# Patient Record
Sex: Female | Born: 1950
Health system: Southern US, Community
[De-identification: ages and names within clinical notes are randomized; demographics above are authoritative.]

## PROBLEM LIST (undated history)

## (undated) DIAGNOSIS — E119 Type 2 diabetes mellitus without complications: Secondary | ICD-10-CM

## (undated) DIAGNOSIS — M1990 Unspecified osteoarthritis, unspecified site: Secondary | ICD-10-CM

## (undated) DIAGNOSIS — Z9889 Other specified postprocedural states: Secondary | ICD-10-CM

## (undated) DIAGNOSIS — T8859XA Other complications of anesthesia, initial encounter: Secondary | ICD-10-CM

## (undated) DIAGNOSIS — E785 Hyperlipidemia, unspecified: Secondary | ICD-10-CM

## (undated) DIAGNOSIS — R112 Nausea with vomiting, unspecified: Secondary | ICD-10-CM

## (undated) DIAGNOSIS — T4145XA Adverse effect of unspecified anesthetic, initial encounter: Secondary | ICD-10-CM

## (undated) DIAGNOSIS — I10 Essential (primary) hypertension: Secondary | ICD-10-CM

## (undated) HISTORY — PX: BREAST SURGERY: SHX581

## (undated) HISTORY — DX: Hyperlipidemia, unspecified: E78.5

## (undated) HISTORY — PX: REFRACTIVE SURGERY: SHX103

## (undated) HISTORY — PX: OTHER SURGICAL HISTORY: SHX169

## (undated) HISTORY — DX: Essential (primary) hypertension: I10

## (undated) HISTORY — PX: TUBAL LIGATION: SHX77

## (undated) HISTORY — PX: DILATION AND CURETTAGE OF UTERUS: SHX78

## (undated) HISTORY — DX: Type 2 diabetes mellitus without complications: E11.9

## (undated) HISTORY — PX: FOOT SURGERY: SHX648

---

## 1996-08-21 HISTORY — PX: BREAST BIOPSY: SHX20

## 2001-04-29 ENCOUNTER — Encounter (HOSPITAL_COMMUNITY): Admission: RE | Admit: 2001-04-29 | Discharge: 2001-05-29 | Payer: Self-pay | Admitting: Orthopedic Surgery

## 2001-05-30 ENCOUNTER — Encounter: Payer: Self-pay | Admitting: Obstetrics and Gynecology

## 2001-05-30 ENCOUNTER — Ambulatory Visit (HOSPITAL_COMMUNITY): Admission: RE | Admit: 2001-05-30 | Discharge: 2001-05-30 | Payer: Self-pay | Admitting: Obstetrics and Gynecology

## 2001-10-18 ENCOUNTER — Encounter: Payer: Self-pay | Admitting: Family Medicine

## 2001-10-18 ENCOUNTER — Ambulatory Visit (HOSPITAL_COMMUNITY): Admission: RE | Admit: 2001-10-18 | Discharge: 2001-10-18 | Payer: Self-pay | Admitting: Family Medicine

## 2002-05-30 ENCOUNTER — Ambulatory Visit (HOSPITAL_COMMUNITY): Admission: RE | Admit: 2002-05-30 | Discharge: 2002-05-30 | Payer: Self-pay | Admitting: Obstetrics and Gynecology

## 2002-05-30 ENCOUNTER — Encounter: Payer: Self-pay | Admitting: Obstetrics and Gynecology

## 2003-06-01 ENCOUNTER — Ambulatory Visit (HOSPITAL_COMMUNITY): Admission: RE | Admit: 2003-06-01 | Discharge: 2003-06-01 | Payer: Self-pay | Admitting: Obstetrics and Gynecology

## 2003-06-01 ENCOUNTER — Encounter: Payer: Self-pay | Admitting: Obstetrics and Gynecology

## 2004-06-02 ENCOUNTER — Ambulatory Visit (HOSPITAL_COMMUNITY): Admission: RE | Admit: 2004-06-02 | Discharge: 2004-06-02 | Payer: Self-pay | Admitting: Obstetrics and Gynecology

## 2005-06-05 ENCOUNTER — Ambulatory Visit (HOSPITAL_COMMUNITY): Admission: RE | Admit: 2005-06-05 | Discharge: 2005-06-05 | Payer: Self-pay | Admitting: Obstetrics and Gynecology

## 2006-06-05 ENCOUNTER — Ambulatory Visit (HOSPITAL_COMMUNITY): Admission: RE | Admit: 2006-06-05 | Discharge: 2006-06-05 | Payer: Self-pay | Admitting: Obstetrics and Gynecology

## 2007-06-17 ENCOUNTER — Ambulatory Visit (HOSPITAL_COMMUNITY): Admission: RE | Admit: 2007-06-17 | Discharge: 2007-06-17 | Payer: Self-pay | Admitting: Obstetrics and Gynecology

## 2007-11-11 ENCOUNTER — Ambulatory Visit (HOSPITAL_COMMUNITY): Admission: RE | Admit: 2007-11-11 | Discharge: 2007-11-11 | Payer: Self-pay | Admitting: Family Medicine

## 2007-11-11 ENCOUNTER — Encounter: Payer: Self-pay | Admitting: Orthopedic Surgery

## 2007-12-03 ENCOUNTER — Ambulatory Visit: Payer: Self-pay | Admitting: Orthopedic Surgery

## 2007-12-03 DIAGNOSIS — M25819 Other specified joint disorders, unspecified shoulder: Secondary | ICD-10-CM | POA: Insufficient documentation

## 2007-12-03 DIAGNOSIS — M25519 Pain in unspecified shoulder: Secondary | ICD-10-CM | POA: Insufficient documentation

## 2007-12-03 DIAGNOSIS — M24519 Contracture, unspecified shoulder: Secondary | ICD-10-CM

## 2007-12-03 DIAGNOSIS — M7580 Other shoulder lesions, unspecified shoulder: Secondary | ICD-10-CM

## 2007-12-09 ENCOUNTER — Encounter (HOSPITAL_COMMUNITY): Admission: RE | Admit: 2007-12-09 | Discharge: 2008-01-08 | Payer: Self-pay | Admitting: Orthopedic Surgery

## 2007-12-09 ENCOUNTER — Encounter: Payer: Self-pay | Admitting: Orthopedic Surgery

## 2007-12-16 ENCOUNTER — Encounter: Payer: Self-pay | Admitting: Orthopedic Surgery

## 2008-01-10 ENCOUNTER — Encounter (HOSPITAL_COMMUNITY): Admission: RE | Admit: 2008-01-10 | Discharge: 2008-02-09 | Payer: Self-pay | Admitting: Orthopedic Surgery

## 2008-02-03 ENCOUNTER — Encounter: Payer: Self-pay | Admitting: Orthopedic Surgery

## 2008-02-10 ENCOUNTER — Encounter (HOSPITAL_COMMUNITY): Admission: RE | Admit: 2008-02-10 | Discharge: 2008-03-11 | Payer: Self-pay | Admitting: Orthopedic Surgery

## 2008-02-26 ENCOUNTER — Encounter: Payer: Self-pay | Admitting: Orthopedic Surgery

## 2008-02-28 ENCOUNTER — Encounter: Payer: Self-pay | Admitting: Orthopedic Surgery

## 2008-03-02 ENCOUNTER — Ambulatory Visit: Payer: Self-pay | Admitting: Orthopedic Surgery

## 2008-06-04 ENCOUNTER — Ambulatory Visit: Payer: Self-pay | Admitting: Orthopedic Surgery

## 2008-06-24 ENCOUNTER — Ambulatory Visit (HOSPITAL_COMMUNITY): Admission: RE | Admit: 2008-06-24 | Discharge: 2008-06-24 | Payer: Self-pay | Admitting: Obstetrics and Gynecology

## 2009-07-06 ENCOUNTER — Ambulatory Visit (HOSPITAL_COMMUNITY): Admission: RE | Admit: 2009-07-06 | Discharge: 2009-07-06 | Payer: Self-pay | Admitting: Obstetrics and Gynecology

## 2010-07-12 ENCOUNTER — Ambulatory Visit (HOSPITAL_COMMUNITY): Admission: RE | Admit: 2010-07-12 | Discharge: 2010-07-12 | Payer: Self-pay | Admitting: Obstetrics and Gynecology

## 2012-11-25 ENCOUNTER — Other Ambulatory Visit: Payer: Self-pay | Admitting: Certified Nurse Midwife

## 2012-11-25 NOTE — Telephone Encounter (Signed)
Pt. Has aex scheduled f

## 2012-11-25 NOTE — Telephone Encounter (Signed)
Pt has AEX sched with 12/20/12 with Ms. Eunice Blase

## 2012-12-18 ENCOUNTER — Other Ambulatory Visit: Payer: Self-pay | Admitting: Certified Nurse Midwife

## 2012-12-18 DIAGNOSIS — Z139 Encounter for screening, unspecified: Secondary | ICD-10-CM

## 2012-12-19 ENCOUNTER — Encounter: Payer: Self-pay | Admitting: Certified Nurse Midwife

## 2012-12-20 ENCOUNTER — Encounter: Payer: Self-pay | Admitting: Certified Nurse Midwife

## 2012-12-20 ENCOUNTER — Ambulatory Visit (INDEPENDENT_AMBULATORY_CARE_PROVIDER_SITE_OTHER): Payer: 59 | Admitting: Certified Nurse Midwife

## 2012-12-20 VITALS — BP 110/60 | HR 68 | Resp 16 | Ht 63.5 in | Wt 171.0 lb

## 2012-12-20 DIAGNOSIS — Z01419 Encounter for gynecological examination (general) (routine) without abnormal findings: Secondary | ICD-10-CM

## 2012-12-20 NOTE — Patient Instructions (Addendum)

## 2012-12-20 NOTE — Progress Notes (Signed)
62 y.o. [id number] Married Caucasian Fe here for annual exam. Menopausal on HRT, denies any vaginal bleeding.  Using Olive Oil for vaginal dryness.  Continues working on glucose control with PCP on Metformin. Patient now reports she is on Pravastatin for cholesterol.  Weight loss now 37 pounds.  Hypertensive medications has been increased to control. Reports cataract surgery was a success, now has drivers license again.  No health issues today   Patient's last menstrual period was 08/21/1988.          Sexually active: no  The current method of family planning is tubal ligation.    Exercising: yes  housework & yardwork Smoker:  no  Health Maintenance: Pap:  11-13-11 neg HPV HRneg MMG:  07/12/10 scheduled for 12/23/12 Colonoscopy:  none BMD:   none TDaP:  2008  Labs: none Self breast exam: occ   reports that she has never smoked. She has never used smokeless tobacco. She reports that she does not drink alcohol or use illicit drugs.  Past Medical History  Diagnosis Date  . Hypertension   . Hyperlipidemia   . Anemia   . Diabetes mellitus without complication     Past Surgical History  Procedure Laterality Date  . Extra digit removed      thumb  . Foot surgery Right   . Arthroscopic surgery Left   . Breast surgery Left     1998  . Dilation and curettage of uterus    . Refractive surgery      both eyes  . Cataract surgery    . Tubal ligation      Current Outpatient Prescriptions  Medication Sig Dispense Refill  . Canagliflozin (INVOKANA) 100 MG TABS Take 100 mg by mouth daily.      . metFORMIN (GLUCOPHAGE) 500 MG tablet Take 500 mg by mouth 2 (two) times daily with a meal.      . calcium-vitamin D (OSCAL 500/200 D-3) 500-200 MG-UNIT per tablet Take 1 tablet by mouth daily.      . clotrimazole-betamethasone (LOTRISONE) cream Apply topically 2 (two) times daily.      Marland Kitchen estradiol-norethindrone (ACTIVELLA) 1-0.5 MG per tablet TAKE ONE TABLET DAILY.  28 tablet  0  . losartan (COZAAR)  50 MG tablet Take 50 mg by mouth daily.      . meloxicam (MOBIC) 7.5 MG tablet Take 7.5 mg by mouth daily.      . Methylcellulose, Laxative, (CITRUCEL PO) Take by mouth daily.      . Potassium Chloride (K+ POTASSIUM PO) Take by mouth daily.      Marland Kitchen PRAVASTATIN SODIUM PO Take 10 mg by mouth daily.      Marland Kitchen terconazole (TERAZOL 7) 0.4 % vaginal cream Place 1 applicator vaginally at bedtime.       No current facility-administered medications for this visit.    Family History  Problem Relation Age of Onset  . Cancer Mother     liver  . Hypertension Mother   . Heart disease Mother     CVD  . Cancer Father     lymphoma    ROS:  Pertinent items are noted in HPI.  Otherwise, a comprehensive ROS was negative.  Exam:   BP 110/60  Pulse 68  Resp 16  Ht 5' 3.5" (1.613 m)  Wt 171 lb (77.565 kg)  BMI 29.81 kg/m2  LMP 08/21/1988 Height: 5' 3.5" (161.3 cm)  Ht Readings from Last 3 Encounters:  12/20/12 5' 3.5" (1.613 m)    General  appearance: alert, cooperative and appears stated age Head: Normocephalic, without obvious abnormality, atraumatic Neck: no adenopathy, supple, symmetrical, trachea midline and thyroid normal to inspection and palpation Lungs: clear to auscultation bilaterally Breasts: normal appearance, no masses or tenderness, No nipple discharge or bleeding Heart: regular rate and rhythm Abdomen: soft, non-tender; no masses,  no organomegaly Extremities: extremities normal, atraumatic, no cyanosis or edema Skin: Skin color, texture, turgor normal. No rashes or lesions Lymph nodes: Cervical, supraclavicular, and axillary nodes normal. No abnormal inguinal nodes palpated Neurologic: Grossly normal   Pelvic: External genitalia:  no lesions              Urethra:  normal appearing urethra with no masses, tenderness or lesions              Bartholin's and Skene's: normal                 Vagina: normal appearing vagina with normal color and discharge, no lesions               Cervix: normal              Pap taken: no Bimanual Exam:  Uterus:  normal size, contour, position, consistency, mobility, non-tender and anteverted              Adnexa: normal adnexa and no mass, fullness, tenderness               Rectovaginal: Confirms               Anus:  normal sphincter tone, no lesions  A:  Well Woman with normal exam  Menopausal on HRT  Hypertension with medication increase  Diabetes still not stable on medication  Medication for cholesterol now  Weight loss in progress  P: Reviewed health and wellness pertinent to exam Discussed risks and benefits of colonoscopy, declies scheduling.  Does IFOB with PCP.  Discussed concerns with continued use of HRT   With current health problems and feel may need to wean off.  Patient agreeable , will start by taking every other day, has 2 weeks left on prescription. Will advise if issues with. Continue follow up as indicated with current health problems with MD.  Pap smear due again@ 66 unless otherwise indicated.  Mammogram yearly   return annually or prn  An After Visit Summary was printed and given to the patient.  Reviewed, TL

## 2012-12-23 ENCOUNTER — Ambulatory Visit (HOSPITAL_COMMUNITY)
Admission: RE | Admit: 2012-12-23 | Discharge: 2012-12-23 | Disposition: A | Discharge status: Home / Self Care | Payer: 59 | Source: Ambulatory Visit | Attending: Certified Nurse Midwife | Admitting: Certified Nurse Midwife

## 2012-12-23 DIAGNOSIS — Z139 Encounter for screening, unspecified: Secondary | ICD-10-CM

## 2012-12-23 DIAGNOSIS — Z1231 Encounter for screening mammogram for malignant neoplasm of breast: Secondary | ICD-10-CM | POA: Insufficient documentation

## 2013-01-29 ENCOUNTER — Telehealth: Payer: Self-pay | Admitting: Certified Nurse Midwife

## 2013-01-29 NOTE — Telephone Encounter (Signed)
Pt says she is gradually going off her hormone medication and is having night sweats really bad. Please call to advise.

## 2013-01-29 NOTE — Telephone Encounter (Signed)
Patient called to report has weaned herself on current hormone medication to 5 - 6 days apart and has 3 more left. Daytime and night time sweating and hot flashes have gotten worse and she is not sleeping due to this. . States her PCP did stop her Pravastatin and cholesterol medication 2 weeks ago due to side effects in her legs and states she feels better being off of them now but would like advise on what to do about a lower dose of hormones. Please advise. sue

## 2013-01-31 NOTE — Telephone Encounter (Signed)
Spoke with patient about what she is experiencing, and Debbie's advise to stop HRT now because the length in Between and then surge with the dose she is taking increasing symptoms. Pt was ok with doing this I advised her To give it at lease 3 weeks,  If symptoms are  not any better to give [place] a call. Pt will f/u  in 3 weeks.

## 2013-01-31 NOTE — Telephone Encounter (Signed)
Patient is experiencing the withdrawal of hormone symptoms which should be come less. Advise to stop HRT now because the length in between and then surge with the dose she is taking is increasing symptoms.

## 2013-02-27 ENCOUNTER — Telehealth: Payer: Self-pay | Admitting: Certified Nurse Midwife

## 2013-02-27 NOTE — Telephone Encounter (Signed)
Patient calling complaining of hot flashes. Please advise?

## 2013-02-27 NOTE — Telephone Encounter (Signed)
Spoke with pt who took her last estradiol pill on June 10th. Pt most recently saw DL on May 2. Pt has been continuing to have 4-5 hot flashes a day, usually accompanied by sweating. Pt thought she should be done with all of this by now. Please advise.

## 2013-02-27 NOTE — Telephone Encounter (Signed)
Spoke with pt about DL advice. Pt agreeable. Pt will call back in a few months if she is still having symptoms.

## 2013-02-27 NOTE — Telephone Encounter (Signed)
Usually takes 2-3 months after stopping estrogen before have decreased significantly and still may hot flashes or night sweats have occasionally.

## 2013-04-22 ENCOUNTER — Telehealth: Payer: Self-pay | Admitting: Certified Nurse Midwife

## 2013-04-22 NOTE — Telephone Encounter (Signed)
Patient has been off hormones for twelve weeks and hot flashes are no better. Please advise?

## 2013-04-23 NOTE — Telephone Encounter (Signed)
I reviewed her chart and feel she can go back on hormones if she is this uncomfortable. If she need rx can refill what is on file. Have patient schedule OV  After resuming hormones if working well, to discuss

## 2013-04-24 ENCOUNTER — Other Ambulatory Visit: Payer: Self-pay | Admitting: Orthopedic Surgery

## 2013-04-24 MED ORDER — ESTRADIOL-NORETHINDRONE ACET 1-0.5 MG PO TABS: ORAL_TABLET | ORAL | Status: DC

## 2013-04-24 NOTE — Telephone Encounter (Signed)
Spoke with pt about DL advice to resume hormones. Pt was taking Activella daily, and refills are needed. Advised pt I would refill for 3 months, and then pt can come in and talk with DL about how things are going. Pt agreeable. Is this OK?

## 2013-04-25 NOTE — Telephone Encounter (Signed)
OK to recheck after she restarts HRT.

## 2013-06-30 ENCOUNTER — Telehealth: Payer: Self-pay | Admitting: Certified Nurse Midwife

## 2013-06-30 ENCOUNTER — Other Ambulatory Visit: Payer: Self-pay | Admitting: Certified Nurse Midwife

## 2013-06-30 MED ORDER — ESTRADIOL-NORETHINDRONE ACET 1-0.5 MG PO TABS: ORAL_TABLET | ORAL | Status: DC

## 2013-06-30 NOTE — Telephone Encounter (Signed)
Estradial--patient calling to speak with the nurse about her status after taking it for almost three months. Patient states she is, "Doing wonderful on the medication!" Patient also will need refills.  Robbie Lis [phone]

## 2013-06-30 NOTE — Telephone Encounter (Signed)
OK to continue on HRT per discussion from phone note after restart ( see phone note encounters) will put refill in

## 2013-06-30 NOTE — Telephone Encounter (Signed)
Tracy Bonilla, please advise, do you agree to order refills? Last note states she was attempting to wean off HRT

## 2013-06-30 NOTE — Telephone Encounter (Signed)
Notified patient. Will pick up medication.

## 2013-12-23 ENCOUNTER — Other Ambulatory Visit: Payer: Self-pay | Admitting: Certified Nurse Midwife

## 2013-12-23 DIAGNOSIS — Z1231 Encounter for screening mammogram for malignant neoplasm of breast: Secondary | ICD-10-CM

## 2013-12-25 ENCOUNTER — Ambulatory Visit (HOSPITAL_COMMUNITY)
Admission: RE | Admit: 2013-12-25 | Discharge: 2013-12-25 | Disposition: A | Discharge status: Home / Self Care | Payer: 59 | Source: Ambulatory Visit | Attending: Certified Nurse Midwife | Admitting: Certified Nurse Midwife

## 2013-12-25 ENCOUNTER — Ambulatory Visit (HOSPITAL_COMMUNITY): Payer: 59

## 2013-12-25 DIAGNOSIS — Z1231 Encounter for screening mammogram for malignant neoplasm of breast: Secondary | ICD-10-CM | POA: Insufficient documentation

## 2014-03-03 ENCOUNTER — Encounter: Payer: Self-pay | Admitting: Certified Nurse Midwife

## 2014-03-03 ENCOUNTER — Ambulatory Visit (INDEPENDENT_AMBULATORY_CARE_PROVIDER_SITE_OTHER): Payer: 59 | Admitting: Certified Nurse Midwife

## 2014-03-03 VITALS — BP 136/76 | HR 64 | Resp 18 | Ht 63.25 in | Wt 180.0 lb

## 2014-03-03 DIAGNOSIS — Z124 Encounter for screening for malignant neoplasm of cervix: Secondary | ICD-10-CM

## 2014-03-03 DIAGNOSIS — I10 Essential (primary) hypertension: Secondary | ICD-10-CM | POA: Insufficient documentation

## 2014-03-03 DIAGNOSIS — Z0000 Encounter for general adult medical examination without abnormal findings: Secondary | ICD-10-CM

## 2014-03-03 DIAGNOSIS — N951 Menopausal and female climacteric states: Secondary | ICD-10-CM

## 2014-03-03 DIAGNOSIS — Z01419 Encounter for gynecological examination (general) (routine) without abnormal findings: Secondary | ICD-10-CM

## 2014-03-03 DIAGNOSIS — E119 Type 2 diabetes mellitus without complications: Secondary | ICD-10-CM | POA: Insufficient documentation

## 2014-03-03 MED ORDER — ESTRADIOL-NORETHINDRONE ACET 0.5-0.1 MG PO TABS: 1.0000 | ORAL_TABLET | Freq: Every day | ORAL | Status: DC

## 2014-03-03 NOTE — Progress Notes (Signed)
63 y.o. [id number] Married Caucasian Fe here for annual exam. Menopausal  On HRT. Patient tried to go off HRT and became" too symptomatic" with hot flashes, night sweats and anxiety feelings. Patient no longer on Norvasc and hypertension well controlled and so is diabetes with Metformin. Recent visit with PCP with aex and all labs per patient normal. Denies vaginal bleeding or vaginal dryness. Desires HRT continuance but will decrease dose, but feel so much better on. No other health issues today.  Patient's last menstrual period was 08/21/1988.          Sexually active: Yes.    The current method of family planning is post menopausal status.    Exercising: Yes.    Walking daily Smoker:  no  Health Maintenance: Pap: 10/2011 Neg. HR HPV: Neg MMG: 12/2013 BIRADS1: Neg Colonoscopy: N/A BMD:   N/A TDaP: 2008 Labs: PCP all negative per patient    reports that she has never smoked. She has never used smokeless tobacco. She reports that she does not drink alcohol or use illicit drugs.  Past Medical History  Diagnosis Date  . Hypertension   . Hyperlipidemia   . Anemia   . Diabetes mellitus without complication     Past Surgical History  Procedure Laterality Date  . Extra digit removed      thumb  . Foot surgery Right   . Arthroscopic surgery Left   . Breast surgery Left     1998  . Dilation and curettage of uterus    . Refractive surgery      both eyes  . Cataract surgery    . Tubal ligation      Current Outpatient Prescriptions  Medication Sig Dispense Refill  . aspirin 81 MG tablet Take 81 mg by mouth daily.      . calcium-vitamin D (OSCAL 500/200 D-3) 500-200 MG-UNIT per tablet Take 1 tablet by mouth daily.      . Canagliflozin (INVOKANA) 100 MG TABS Take 100 mg by mouth daily.      Marland Kitchen estradiol-norethindrone (ACTIVELLA) 1-0.5 MG per tablet TAKE ONE TABLET DAILY.  28 tablet  7  . hydrochlorothiazide (HYDRODIURIL) 25 MG tablet Take 25 mg by mouth daily.       Marland Kitchen losartan (COZAAR)  50 MG tablet Take 50 mg by mouth daily.      . metFORMIN (GLUCOPHAGE) 500 MG tablet Take 500 mg by mouth 2 (two) times daily with a meal.      . Methylcellulose, Laxative, (CITRUCEL PO) Take by mouth daily.      . potassium chloride (K-DUR,KLOR-CON) 10 MEQ tablet       . clotrimazole-betamethasone (LOTRISONE) cream Apply topically 2 (two) times daily.       No current facility-administered medications for this visit.    Family History  Problem Relation Age of Onset  . Cancer Mother     liver  . Hypertension Mother   . Heart disease Mother     CVD  . Cancer Father     lymphoma    ROS:  Pertinent items are noted in HPI.  Otherwise, a comprehensive ROS was negative.  Exam:   BP 136/76  Pulse 64  Resp 18  Ht 5' 3.25" (1.607 m)  Wt 180 lb (81.647 kg)  BMI 31.62 kg/m2  LMP 08/21/1988 Height: 5' 3.25" (160.7 cm)  Ht Readings from Last 3 Encounters:  03/03/14 5' 3.25" (1.607 m)  12/20/12 5' 3.5" (1.613 m)    General appearance: alert, cooperative and  appears stated age Head: Normocephalic, without obvious abnormality, atraumatic Neck: no adenopathy, supple, symmetrical, trachea midline and thyroid normal to inspection and palpation and non-palpable Lungs: clear to auscultation bilaterally Breasts: normal appearance, no masses or tenderness, No nipple retraction or dimpling, No nipple discharge or bleeding, No axillary or supraclavicular adenopathy Heart: regular rate and rhythm Abdomen: soft, non-tender; no masses,  no organomegaly Extremities: extremities normal, atraumatic, no cyanosis or edema Skin: Skin color, texture, turgor normal. No rashes or lesions Lymph nodes: Cervical, supraclavicular, and axillary nodes normal. No abnormal inguinal nodes palpated Neurologic: Grossly normal   Pelvic: External genitalia:  no lesions              Urethra:  normal appearing urethra with no masses, tenderness or lesions              Bartholin's and Skene's: normal                  Vagina: normal appearing vagina with normal color and discharge, no lesions              Cervix: normal,non tender              Pap taken: Yes.   Bimanual Exam:  Uterus:  normal size, contour, position, consistency, mobility, non-tender and anteverted              Adnexa: normal adnexa and no mass, fullness, tenderness               Rectovaginal: Confirms               Anus:  normal sphincter tone, no lesions  A:  Well Woman with normal exam  Menopausal on HRT unable to wean off due to symptoms  Hypertension/Type 2 Diabetes well controlled with medication with PCP management.  Colonoscopy due declines scheduling, plans to schedule with PCP in 12/15 PCP dispensed IFOB negative per patient  P:   Reviewed health and wellness pertinent to exam  Discussed risks and benefits and concerns with long term use of estrogen and progesterone, cardiovascular concerns. Patient acknowledged this information, but tried to stop and felt " horrible". Agreeable to decrease dose with re-evaluation with health change.  Rx  Pap smear taken today  counseled on breast self exam, mammography screening, use and side effects of HRT, adequate intake of calcium and vitamin D, diet and exercise return annually or prn  An After Visit Summary was printed and given to the patient.

## 2014-03-03 NOTE — Progress Notes (Signed)
Reviewed personally.  M. Suzanne Steadman Prosperi, MD.  

## 2014-03-03 NOTE — Patient Instructions (Addendum)
EXERCISE AND DIET:  We recommended that you start or continue a regular exercise program for good health. Regular exercise means any activity that makes your heart beat faster and makes you sweat.  We recommend exercising at least 30 minutes per day at least 3 days a week, preferably 4 or 5.  We also recommend a diet low in fat and sugar.  Inactivity, poor dietary choices and obesity can cause diabetes, heart attack, stroke, and kidney damage, among others.    ALCOHOL AND SMOKING:  Women should limit their alcohol intake to no more than 7 drinks/beers/glasses of wine (combined, not each!) per week. Moderation of alcohol intake to this level decreases your risk of breast cancer and liver damage. And of course, no recreational drugs are part of a healthy lifestyle.  And absolutely no smoking or even second hand smoke. Most people know smoking can cause heart and lung diseases, but did you know it also contributes to weakening of your bones? Aging of your skin?  Yellowing of your teeth and nails?  CALCIUM AND VITAMIN D:  Adequate intake of calcium and Vitamin D are recommended.  The recommendations for exact amounts of these supplements seem to change often, but generally speaking 600 mg of calcium (either carbonate or citrate) and 800 units of Vitamin D per day seems prudent. Certain women may benefit from higher intake of Vitamin D.  If you are among these women, your doctor will have told you during your visit.    PAP SMEARS:  Pap smears, to check for cervical cancer or precancers,  have traditionally been done yearly, although recent scientific advances have shown that most women can have pap smears less often.  However, every woman still should have a physical exam from her gynecologist every year. It will include a breast check, inspection of the vulva and vagina to check for abnormal growths or skin changes, a visual exam of the cervix, and then an exam to evaluate the size and shape of the uterus and  ovaries.  And after 62 years of age, a rectal exam is indicated to check for rectal cancers. We will also provide age appropriate advice regarding health maintenance, like when you should have certain vaccines, screening for sexually transmitted diseases, bone density testing, colonoscopy, mammograms, etc.   MAMMOGRAMS:  All women over 40 years old should have a yearly mammogram. Many facilities now offer a "3D" mammogram, which may cost around $50 extra out of pocket. If possible,  we recommend you accept the option to have the 3D mammogram performed.  It both reduces the number of women who will be called back for extra views which then turn out to be normal, and it is better than the routine mammogram at detecting truly abnormal areas.    COLONOSCOPY:  Colonoscopy to screen for colon cancer is recommended for all women at age 50.  We know, you hate the idea of the prep.  We agree, BUT, having colon cancer and not knowing it is worse!!  Colon cancer so often starts as a polyp that can be seen and removed at colonscopy, which can quite literally save your life!  And if your first colonoscopy is normal and you have no family history of colon cancer, most women don't have to have it again for 10 years.  Once every ten years, you can do something that may end up saving your life, right?  We will be happy to help you get it scheduled when you are ready.    Be sure to check your insurance coverage so you understand how much it will cost.  It may be covered as a preventative service at no cost, but you should check your particular policy.    Hormone Therapy At menopause, your body begins making less estrogen and progesterone hormones. This causes the body to stop having menstrual periods. This is because estrogen and progesterone hormones control your periods and menstrual cycle. A lack of estrogen may cause symptoms such as:  Hot flushes (or hot flashes).  Vaginal dryness.  Dry skin.  Loss of sex  drive.  Risk of bone loss (osteoporosis). When this happens, you may choose to take hormone therapy to get back the estrogen lost during menopause. When the hormone estrogen is given alone, it is usually referred to as ET (Estrogen Therapy). When the hormone progestin is combined with estrogen, it is generally called HT (Hormone Therapy). This was formerly known as hormone replacement therapy (HRT). Your caregiver can help you make a decision on what will be best for you. The decision to use HT seems to change often as new studies are done. Many studies do not agree on the benefits of hormone replacement therapy. LIKELY BENEFITS OF HT INCLUDE PROTECTION FROM:  Hot Flushes (also called hot flashes) - A hot flush is a sudden feeling of heat that spreads over the face and body. The skin may redden like a blush. It is connected with sweats and sleep disturbance. Women going through menopause may have hot flushes a few times a month or several times per day depending on the woman.  Osteoporosis (bone loss)- Estrogen helps guard against bone loss. After menopause, a woman's bones slowly lose calcium and become weak and brittle. As a result, bones are more likely to break. The hip, wrist, and spine are affected most often. Hormone therapy can help slow bone loss after menopause. Weight bearing exercise and taking calcium with vitamin D also can help prevent bone loss. There are also medications that your caregiver can prescribe that can help prevent osteoporosis.  Vaginal Dryness - Loss of estrogen causes changes in the vagina. Its lining may become thin and dry. These changes can cause pain and bleeding during sexual intercourse. Dryness can also lead to infections. This can cause burning and itching. (Vaginal estrogen treatment can help relieve pain, itching, and dryness.)  Urinary Tract Infections are more common after menopause because of lack of estrogen. Some women also develop urinary incontinence  because of low estrogen levels in the vagina and bladder.  Possible other benefits of estrogen include a positive effect on mood and short-term memory in women. RISKS AND COMPLICATIONS  Using estrogen alone without progesterone causes the lining of the uterus to grow. This increases the risk of lining of the uterus (endometrial) cancer. Your caregiver should give another hormone called progestin if you have a uterus.  Women who take combined (estrogen and progestin) HT appear to have an increased risk of breast cancer. The risk appears to be small, but increases throughout the time that HT is taken.  Combined therapy also makes the breast tissue slightly denser which makes it harder to read mammograms (breast X-rays).  Combined, estrogen and progesterone therapy can be taken together every day, in which case there may be spotting of blood. HT therapy can be taken cyclically in which case you will have menstrual periods. Cyclically means HT is taken for a set amount of days, then not taken, then this process is repeated.  HT may   increase the risk of stroke, heart attack, breast cancer and forming blood clots in your leg.  Transdermal estrogen (estrogen that is absorbed through the skin with a patch or a cream) may have more positive results with:  Cholesterol.  Blood pressure.  Blood clots. Having the following conditions may indicate you should not have HT:  Endometrial cancer.  Liver disease.  Breast cancer.  Heart disease.  History of blood clots.  Stroke. TREATMENT   If you choose to take HT and have a uterus, usually estrogen and progestin are prescribed.  Your caregiver will help you decide the best way to take the medications.  Possible ways to take estrogen include:  Pills.  Patches.  Gels.  Sprays.  Vaginal estrogen cream, rings and tablets.  It is best to take the lowest dose possible that will help your symptoms and take them for the shortest period of  time that you can.  Hormone therapy can help relieve some of the problems (symptoms) that affect women at menopause. Before making a decision about HT, talk to your caregiver about what is best for you. Be well informed and comfortable with your decisions. HOME CARE INSTRUCTIONS   Follow your caregivers advice when taking the medications.  A Pap test is done to screen for cervical cancer.  The first Pap test should be done at age 21.  Between ages 21 and 29, Pap tests are repeated every 2 years.  Beginning at age 30, you are advised to have a Pap test every 3 years as long as your past 3 Pap tests have been normal.  Some women have medical problems that increase the chance of getting cervical cancer. Talk to your caregiver about these problems. It is especially important to talk to your caregiver if a new problem develops soon after your last Pap test. In these cases, your caregiver may recommend more frequent screening and Pap tests.  The above recommendations are the same for women who have or have not gotten the vaccine for HPV (Human Papillomavirus).  If you had a hysterectomy for a problem that was not a cancer or a condition that could lead to cancer, then you no longer need Pap tests. However, even if you no longer need a Pap test, a regular exam is a good idea to make sure no other problems are starting.   If you are between ages 65 and 70, and you have had normal Pap tests going back 10 years, you no longer need Pap tests. However, even if you no longer need a Pap test, a regular exam is a good idea to make sure no other problems are starting.   If you have had past treatment for cervical cancer or a condition that could lead to cancer, you need Pap tests and screening for cancer for at least 20 years after your treatment.  If Pap tests have been discontinued, risk factors (such as a new sexual partner) need to be re-assessed to determine if screening should be  resumed.  Some women may need screenings more often if they are at high risk for cervical cancer.  Get mammograms done as per the advice of your caregiver. SEEK IMMEDIATE MEDICAL CARE IF:  You develop abnormal vaginal bleeding.  You have pain or swelling in your legs, shortness of breath, or chest pain.  You develop dizziness or headaches.  You have lumps or changes in your breasts or armpits.  You have slurred speech.  You develop weakness or numbness of   your arms or legs.  You have pain, burning, or bleeding when urinating.  You develop abdominal pain. Document Released: 05/06/2003 Document Revised: 10/30/2011 Document Reviewed: 08/24/2010 [employer] Patient Information 2015 [employer]. This information is not intended to replace advice given to you by your health care provider. Make sure you discuss any questions you have with your health care provider.  

## 2014-03-05 LAB — IPS PAP TEST WITH HPV

## 2014-03-30 ENCOUNTER — Telehealth: Payer: Self-pay | Admitting: Certified Nurse Midwife

## 2014-03-30 MED ORDER — ESTRADIOL-NORETHINDRONE ACET 1-0.5 MG PO TABS: ORAL_TABLET | ORAL | Status: DC

## 2014-03-30 NOTE — Telephone Encounter (Signed)
Spoke with patient. Advised of message as seen below from Verner Choleborah S. Leonard CNM. Patient is agreeable and verbalizes understanding. Rx for Estradiol 1-0.5mg  #30 [id number] placed to pharmacy on file. Patient will call back if symptoms persist with dosage increase.  Routing to provider for final review. Patient agreeable to disposition. Will close encounter

## 2014-03-30 NOTE — Telephone Encounter (Signed)
Patient can return to original dose to see if resolves. If not needs OV

## 2014-03-30 NOTE — Telephone Encounter (Signed)
Pt is calling to talk with the nurse she needs to give her an update on how she is doing.

## 2014-03-30 NOTE — Telephone Encounter (Signed)
Spoke with patient. Patient states that she was seen on 7/14 with Verner Choleborah S. Leonard CNM and her hormone medication dosage was decreased. Patient is currently taking 0.5/0.1 of Estradiol. Patient states that she is still having "hot sweats, flashes, and a hot face almost all the time. I am having to wash my face to cool down with a dish cloth that is how much I am sweating." Advised patient would send a message over to Verner Choleborah S. Leonard CNM and give her an update. Advised would call back with further recommendations and instructions. Patient agreeable.

## 2014-04-22 ENCOUNTER — Telehealth: Payer: Self-pay | Admitting: Certified Nurse Midwife

## 2014-04-22 NOTE — Telephone Encounter (Signed)
Spoke with patient. Patient states that she is still sweating and having hot flashes. "Water is dripping of my face. It feels like I have a mask on. It is better than before but is still uncomfortable. I have had 5-6 hot flashes today." Patient would like to know if there is anything else that can be done as she is having hot flashes so often. Advised patient would send a message over to Verner Chol CNM and give her a call back with any further recommendations. Patient is agreeable. Patient is currently on Activella.

## 2014-04-22 NOTE — Telephone Encounter (Signed)
She needs OV to evaluate and discuss

## 2014-04-22 NOTE — Telephone Encounter (Signed)
Pt wanted to let Eunice Blase know how she is doing on her hormone medication.

## 2014-04-23 ENCOUNTER — Ambulatory Visit (INDEPENDENT_AMBULATORY_CARE_PROVIDER_SITE_OTHER): Payer: 59 | Admitting: Certified Nurse Midwife

## 2014-04-23 ENCOUNTER — Encounter: Payer: Self-pay | Admitting: Certified Nurse Midwife

## 2014-04-23 VITALS — BP 120/80 | HR 72 | Resp 16 | Ht 63.25 in | Wt 179.0 lb

## 2014-04-23 DIAGNOSIS — N951 Menopausal and female climacteric states: Secondary | ICD-10-CM

## 2014-04-23 NOTE — Telephone Encounter (Signed)
Spoke with patient. Advised of message as seen below from Tracy Bonilla CNM. Patient agreeable. Patient will run out of rx on Sunday. Appointment scheduled for today at 11:15am. Agreeable to date and time.  Routing to provider for final review. Patient agreeable to disposition. Will close encounter

## 2014-04-23 NOTE — Progress Notes (Signed)
63 y.o.Married Caucasian female [phone] here for follow-up of HRT decrease in dosage.  Patient has been on Activella 1/0.5 and decrease to 1/2 tablet.  Initiated 03/09/14.  Patient taking medication as instructed PM. Patient noticed that hot flashes started returning and became uncomfortable again, but remained on same dose for one month with increasing  night sweats. Patient went back to previous tablet and with relief and is sleeping well. Patient continues with profuse sweating in am and has to change clothes. She is on no new medications, but does take several medications in am together. She feels it is the Invokana she is on. Patient came it to discuss the issue to make sure it was not hormonal.She also takes Metformin and feels her glucose level is normal. No other concerns.  O:Healthy WD,WN female, appropriately dressed     Affect : Appropriate    A: Menopausal on HRT working well Profuse sweating not related to menopausal symptoms  P:1- Continue HRT medication as prescribed Discussed with patient that she and I both feel after discussion that it is not HRT related. Discussed follow up with PCP as a new problem or medication side effect. Patient agrees and plans to schedule appointment to discuss with PCP.  Rv prn   20 minutes spent with patient  in face to face counseling.

## 2014-04-25 NOTE — Progress Notes (Signed)
Reviewed personally.  M. Suzanne Kairee Kozma, MD.  

## 2014-06-22 ENCOUNTER — Encounter: Payer: Self-pay | Admitting: Certified Nurse Midwife

## 2014-12-08 ENCOUNTER — Other Ambulatory Visit (HOSPITAL_COMMUNITY): Payer: Self-pay | Admitting: Physician Assistant

## 2014-12-08 ENCOUNTER — Ambulatory Visit (HOSPITAL_COMMUNITY)
Admission: RE | Admit: 2014-12-08 | Discharge: 2014-12-08 | Disposition: A | Discharge status: Home / Self Care | Payer: 59 | Source: Ambulatory Visit | Attending: Physician Assistant | Admitting: Physician Assistant

## 2014-12-08 DIAGNOSIS — M25562 Pain in left knee: Secondary | ICD-10-CM | POA: Diagnosis present

## 2014-12-08 DIAGNOSIS — M79672 Pain in left foot: Secondary | ICD-10-CM

## 2014-12-08 DIAGNOSIS — G8929 Other chronic pain: Secondary | ICD-10-CM | POA: Diagnosis not present

## 2015-01-25 ENCOUNTER — Other Ambulatory Visit: Payer: Self-pay | Admitting: Nurse Practitioner

## 2015-01-25 DIAGNOSIS — Z1231 Encounter for screening mammogram for malignant neoplasm of breast: Secondary | ICD-10-CM

## 2015-02-04 ENCOUNTER — Ambulatory Visit (HOSPITAL_COMMUNITY)
Admission: RE | Admit: 2015-02-04 | Discharge: 2015-02-04 | Disposition: A | Discharge status: Home / Self Care | Payer: 59 | Source: Ambulatory Visit | Attending: Nurse Practitioner | Admitting: Nurse Practitioner

## 2015-02-04 DIAGNOSIS — Z1231 Encounter for screening mammogram for malignant neoplasm of breast: Secondary | ICD-10-CM | POA: Insufficient documentation

## 2015-03-08 ENCOUNTER — Ambulatory Visit: Payer: Self-pay | Admitting: Certified Nurse Midwife

## 2015-03-09 ENCOUNTER — Ambulatory Visit: Payer: Self-pay | Admitting: Nurse Practitioner

## 2015-04-27 ENCOUNTER — Ambulatory Visit: Payer: Self-pay | Admitting: Orthopedic Surgery

## 2015-05-05 ENCOUNTER — Ambulatory Visit: Payer: Self-pay | Admitting: Orthopedic Surgery

## 2015-05-05 NOTE — Patient Instructions (Addendum)
YOUR PROCEDURE IS SCHEDULED ON :  05/14/15  REPORT TO [facility] MAIN ENTRANCE FOLLOW SIGNS TO EAST ELEVATOR - GO TO 3rd FLOOR CHECK IN AT 3 EAST NURSES STATION (SHORT STAY) AT:   7:30 AM  CALL THIS NUMBER IF YOU HAVE PROBLEMS THE MORNING OF SURGERY [phone]  REMEMBER:ONLY 1 PER PERSON MAY GO TO SHORT STAY WITH YOU TO GET READY THE MORNING OF YOUR SURGERY  DO NOT EAT FOOD OR DRINK LIQUIDS AFTER MIDNIGHT  TAKE THESE MEDICINES THE MORNING OF SURGERY: NONE  YOU MAY NOT HAVE ANY METAL ON YOUR BODY INCLUDING HAIR PINS AND PIERCING'S. DO NOT WEAR JEWELRY, MAKEUP, LOTIONS, POWDERS OR PERFUMES. DO NOT WEAR NAIL POLISH. DO NOT SHAVE 48 HRS PRIOR TO SURGERY. MEN MAY SHAVE FACE AND NECK.  DO NOT BRING VALUABLES TO HOSPITAL. Tracy Bonilla IS NOT RESPONSIBLE FOR VALUABLES.  CONTACTS, DENTURES OR PARTIALS MAY NOT BE WORN TO SURGERY. LEAVE SUITCASE IN CAR. CAN BE BROUGHT TO ROOM AFTER SURGERY.  PATIENTS DISCHARGED THE DAY OF SURGERY WILL NOT BE ALLOWED TO DRIVE HOME.  PLEASE READ OVER THE FOLLOWING INSTRUCTION SHEETS _________________________________________________________________________________                                          Tracy Bonilla - PREPARING FOR SURGERY  Before surgery, you can play an important role.  Because skin is not sterile, your skin needs to be as free of germs as possible.  You can reduce the number of germs on your skin by washing with CHG (chlorahexidine gluconate) soap before surgery.  CHG is an antiseptic cleaner which kills germs and bonds with the skin to continue killing germs even after washing. Please DO NOT use if you have an allergy to CHG or antibacterial soaps.  If your skin becomes reddened/irritated stop using the CHG and inform your nurse when you arrive at Short Stay. Do not shave (including legs and underarms) for at least 48 hours prior to the first CHG shower.  You may shave your face. Please follow these instructions  carefully:   1.  Shower with CHG Soap the night before surgery and the  morning of Surgery.   2.  If you choose to wash your hair, wash your hair first as usual with your  normal  Shampoo.   3.  After you shampoo, rinse your hair and body thoroughly to remove the  shampoo.                                         4.  Use CHG as you would any other liquid soap.  You can apply chg directly  to the skin and wash . Gently wash with scrungie or clean wascloth    5.  Apply the CHG Soap to your body ONLY FROM THE NECK DOWN.   Do not use on open                           Wound or open sores. Avoid contact with eyes, ears mouth and genitals (private parts).                        Genitals (private parts) with your normal soap.  6.  Wash thoroughly, paying special attention to the area where your surgery  will be performed.   7.  Thoroughly rinse your body with warm water from the neck down.   8.  DO NOT shower/wash with your normal soap after using and rinsing off  the CHG Soap .                9.  Pat yourself dry with a clean towel.             10.  Wear clean night clothes to bed after shower             11.  Place clean sheets on your bed the night of your first shower and do not  sleep with pets.  Day of Surgery : Do not apply any lotions/deodorants the morning of surgery.  Please wear clean clothes to the hospital/surgery center.  FAILURE TO FOLLOW THESE INSTRUCTIONS MAY RESULT IN THE CANCELLATION OF YOUR SURGERY    PATIENT SIGNATURE_________________________________  ______________________________________________________________________     Tracy Bonilla  An incentive spirometer is a tool that can help keep your lungs clear and active. This tool measures how well you are filling your lungs with each breath. Taking long deep breaths may help reverse or decrease the chance of developing breathing (pulmonary) problems (especially infection) following:  A long  period of time when you are unable to move or be active. BEFORE THE PROCEDURE   If the spirometer includes an indicator to show your best effort, your nurse or respiratory therapist will set it to a desired goal.  If possible, sit up straight or lean slightly forward. Try not to slouch.  Hold the incentive spirometer in an upright position. INSTRUCTIONS FOR USE   Sit on the edge of your bed if possible, or sit up as far as you can in bed or on a chair.  Hold the incentive spirometer in an upright position.  Breathe out normally.  Place the mouthpiece in your mouth and seal your lips tightly around it.  Breathe in slowly and as deeply as possible, raising the piston or the ball toward the top of the column.  Hold your breath for 3-5 seconds or for as long as possible. Allow the piston or ball to fall to the bottom of the column.  Remove the mouthpiece from your mouth and breathe out normally.  Rest for a few seconds and repeat Steps 1 through 7 at least 10 times every 1-2 hours when you are awake. Take your time and take a few normal breaths between deep breaths.  The spirometer may include an indicator to show your best effort. Use the indicator as a goal to work toward during each repetition.  After each set of 10 deep breaths, practice coughing to be sure your lungs are clear. If you have an incision (the cut made at the time of surgery), support your incision when coughing by placing a pillow or rolled up towels firmly against it. Once you are able to get out of bed, walk around indoors and cough well. You may stop using the incentive spirometer when instructed by your caregiver.  RISKS AND COMPLICATIONS  Take your time so you do not get dizzy or light-headed.  If you are in pain, you may need to take or ask for pain medication before doing incentive spirometry. It is harder to take a deep breath if you are having pain. AFTER USE  Rest and breathe slowly and easily.  It can  be helpful to keep track of a log of your progress. Your caregiver can provide you with a simple table to help with this. If you are using the spirometer at home, follow these instructions: Tracy Bonilla IF:   You are having difficultly using the spirometer.  You have trouble using the spirometer as often as instructed.  Your pain medication is not giving enough relief while using the spirometer.  You develop fever of 100.5 F (38.1 C) or higher. SEEK IMMEDIATE MEDICAL CARE IF:   You cough up bloody sputum that had not been present before.  You develop fever of 102 F (38.9 C) or greater.  You develop worsening pain at or near the incision site. MAKE SURE YOU:   Understand these instructions.  Will watch your condition.  Will get help right away if you are not doing well or get worse. Document Released: 12/18/2006 Document Revised: 10/30/2011 Document Reviewed: 02/18/2007 Exit[employer] Patient Information 2014 Exit[employer], [place].   ________________________________________________________________________  WHAT IS A BLOOD TRANSFUSION? Blood Transfusion Information  A transfusion is the replacement of blood or some of its parts. Blood is made up of multiple cells which provide different functions.  Red blood cells carry oxygen and are used for blood loss replacement.  White blood cells fight against infection.  Platelets control bleeding.  Plasma helps clot blood.  Other blood products are available for specialized needs, such as hemophilia or other clotting disorders. BEFORE THE TRANSFUSION  Who gives blood for transfusions?   Healthy volunteers who are fully evaluated to make sure their blood is safe. This is blood bank blood. Transfusion therapy is the safest it has ever been in the practice of medicine. Before blood is taken from a donor, a complete history is taken to make sure that person has no history of diseases nor engages in risky social behavior (examples are  intravenous drug use or sexual activity with multiple partners). The donor's travel history is screened to minimize risk of transmitting infections, such as malaria. The donated blood is tested for signs of infectious diseases, such as HIV and hepatitis. The blood is then tested to be sure it is compatible with you in order to minimize the chance of a transfusion reaction. If you or a relative donates blood, this is often done in anticipation of surgery and is not appropriate for emergency situations. It takes many days to process the donated blood. RISKS AND COMPLICATIONS Although transfusion therapy is very safe and saves many lives, the main dangers of transfusion include:   Getting an infectious disease.  Developing a transfusion reaction. This is an allergic reaction to something in the blood you were given. Every precaution is taken to prevent this. The decision to have a blood transfusion has been considered carefully by your caregiver before blood is given. Blood is not given unless the benefits outweigh the risks. AFTER THE TRANSFUSION  Right after receiving a blood transfusion, you will usually feel much better and more energetic. This is especially true if your red blood cells have gotten low (anemic). The transfusion raises the level of the red blood cells which carry oxygen, and this usually causes an energy increase.  The nurse administering the transfusion will monitor you carefully for complications. HOME CARE INSTRUCTIONS  No special instructions are needed after a transfusion. You may find your energy is better. Speak with your caregiver about any limitations on activity for underlying diseases you may have. SEEK MEDICAL CARE IF:   Your  condition is not improving after your transfusion.  You develop redness or irritation at the intravenous (IV) site. SEEK IMMEDIATE MEDICAL CARE IF:  Any of the following symptoms occur over the next 12 hours:  Shaking chills.  You have a  temperature by mouth above 102 F (38.9 C), not controlled by medicine.  Chest, back, or muscle pain.  People around you feel you are not acting correctly or are confused.  Shortness of breath or difficulty breathing.  Dizziness and fainting.  You get a rash or develop hives.  You have a decrease in urine output.  Your urine turns a dark color or changes to pink, red, or brown. Any of the following symptoms occur over the next 10 days:  You have a temperature by mouth above 102 F (38.9 C), not controlled by medicine.  Shortness of breath.  Weakness after normal activity.  The white part of the eye turns yellow (jaundice).  You have a decrease in the amount of urine or are urinating less often.  Your urine turns a dark color or changes to pink, red, or brown. Document Released: 08/04/2000 Document Revised: 10/30/2011 Document Reviewed: 03/23/2008 [facility] Patient Information 2014 [place], [place].

## 2015-05-05 NOTE — H&P (Signed)
TOTAL KNEE ADMISSION H&P  Patient is being admitted for left total knee arthroplasty.  Subjective:  Chief Complaint:left knee pain.  HPI: Tracy Bonilla, 64 y.o. female, has a history of pain and functional disability in the left knee due to arthritis and has failed non-surgical conservative treatments for greater than 12 weeks to includeNSAID's and/or analgesics, corticosteriod injections, flexibility and strengthening excercises, supervised PT with diminished ADL's post treatment, use of assistive devices, weight reduction as appropriate and activity modification.  Onset of symptoms was gradual, starting 1 years ago with rapidlly worsening course since that time. The patient noted prior procedures on the knee to include  arthroscopy on the left knee(s).  Patient currently rates pain in the left knee(s) at 10 out of 10 with activity. Patient has night pain, worsening of pain with activity and weight bearing, pain that interferes with activities of daily living, pain with passive range of motion, crepitus and joint swelling.  Patient has evidence of subchondral cysts, subchondral sclerosis, periarticular osteophytes and joint space narrowing by imaging studies.  There is no active infection.  Patient Active Problem List   Diagnosis Date Noted  . Unspecified essential hypertension 03/03/2014    Class: History of  . Type 2 diabetes mellitus 03/03/2014    Class: History of  . CONTRACTURE OF SHOULDER JOINT 12/03/2007  . SHOULDER PAIN 12/03/2007  . IMPINGEMENT SYNDROME 12/03/2007   Past Medical History  Diagnosis Date  . Hypertension   . Hyperlipidemia   . Anemia   . Diabetes mellitus without complication     Past Surgical History  Procedure Laterality Date  . Extra digit removed      thumb  . Foot surgery Right   . Arthroscopic surgery Left   . Breast surgery Left     1998  . Dilation and curettage of uterus    . Refractive surgery      both eyes  . Cataract surgery    . Tubal  ligation       (Not in a hospital admission) No Known Allergies  Social History  Substance Use Topics  . Smoking status: Never Smoker   . Smokeless tobacco: Never Used  . Alcohol Use: No    Family History  Problem Relation Age of Onset  . Cancer Mother     liver  . Hypertension Mother   . Heart disease Mother     CVD  . Cancer Father     lymphoma     Review of Systems  Constitutional: Negative.   HENT: Negative.   Eyes: Negative.   Cardiovascular: Negative.   Gastrointestinal: Negative.   Genitourinary: Negative.   Musculoskeletal: Positive for joint pain.  Skin: Negative.   Neurological: Positive for dizziness. Negative for tingling, tremors, sensory change, speech change, focal weakness, seizures and loss of consciousness.  Endo/Heme/Allergies: Negative.   Psychiatric/Behavioral: Negative.     Objective:  Physical Exam  Constitutional: She is oriented to person, place, and time. She appears well-developed and well-nourished.  HENT:  Head: Normocephalic and atraumatic.  Eyes: Conjunctivae and EOM are normal. Pupils are equal, round, and reactive to light.  Neck: Normal range of motion. Neck supple.  Cardiovascular: Normal rate and regular rhythm.   Respiratory: Effort normal and breath sounds normal.  GI: Soft. Bowel sounds are normal. She exhibits no distension. There is no tenderness.  Genitourinary:  deferred  Musculoskeletal:       Left knee: She exhibits decreased range of motion, swelling, effusion and bony tenderness.  Neurological: She is oriented to person, place, and time. She has normal reflexes.  Skin: Skin is warm and dry.  Psychiatric: She has a normal mood and affect. Her behavior is normal. Judgment and thought content normal.    Vital signs in last 24 hours: @  Labs:   Estimated body mass index is 31.44 kg/(m^2) as calculated from the following:   Height as of 04/23/14: 5' 3.25" (1.607 m).   Weight as of 04/23/14: 81.194 kg (179  lb).   Imaging Review Plain radiographs demonstrate severe degenerative joint disease of the left knee(s). The overall alignment issignificant varus. The bone quality appears to be adequate for age and reported activity level.  Assessment/Plan:  End stage arthritis, left knee   The patient history, physical examination, clinical judgment of the provider and imaging studies are consistent with end stage degenerative joint disease of the left knee(s) and total knee arthroplasty is deemed medically necessary. The treatment options including medical management, injection therapy arthroscopy and arthroplasty were discussed at length. The risks and benefits of total knee arthroplasty were presented and reviewed. The risks due to aseptic loosening, infection, stiffness, patella tracking problems, thromboembolic complications and other imponderables were discussed. The patient acknowledged the explanation, agreed to proceed with the plan and consent was signed. Patient is being admitted for inpatient treatment for surgery, pain control, PT, OT, prophylactic antibiotics, VTE prophylaxis, progressive ambulation and ADL's and discharge planning. The patient is planning to be discharged home with home health services

## 2015-05-06 ENCOUNTER — Encounter (HOSPITAL_COMMUNITY)
Admission: RE | Admit: 2015-05-06 | Discharge: 2015-05-06 | Disposition: A | Discharge status: Home / Self Care | Payer: Commercial Managed Care - HMO | Source: Ambulatory Visit | Attending: Orthopedic Surgery | Admitting: Orthopedic Surgery

## 2015-05-06 ENCOUNTER — Encounter (HOSPITAL_COMMUNITY): Payer: Self-pay

## 2015-05-06 DIAGNOSIS — Z01818 Encounter for other preprocedural examination: Secondary | ICD-10-CM | POA: Insufficient documentation

## 2015-05-06 HISTORY — DX: Unspecified osteoarthritis, unspecified site: M19.90

## 2015-05-06 HISTORY — DX: Adverse effect of unspecified anesthetic, initial encounter: T41.45XA

## 2015-05-06 HISTORY — DX: Other complications of anesthesia, initial encounter: T88.59XA

## 2015-05-06 HISTORY — DX: Other specified postprocedural states: R11.2

## 2015-05-06 HISTORY — DX: Other specified postprocedural states: Z98.890

## 2015-05-06 LAB — URINALYSIS, ROUTINE W REFLEX MICROSCOPIC
Bilirubin Urine: NEGATIVE
Glucose, UA: >1000 mg/dL — AB
Hgb urine dipstick: NEGATIVE
KETONES UR: NEGATIVE mg/dL
LEUKOCYTES UA: NEGATIVE
NITRITE: NEGATIVE
PH: 5 (ref 5.0–8.0)
Protein, ur: NEGATIVE mg/dL
Specific Gravity, Urine: 1.017 (ref 1.005–1.030)
Urobilinogen, UA: 0.2 mg/dL (ref 0.0–1.0)

## 2015-05-06 LAB — COMPREHENSIVE METABOLIC PANEL
ALK PHOS: 66 U/L (ref 38–126)
ALT: 11 U/L — AB (ref 14–54)
AST: 16 U/L (ref 15–41)
Albumin: 4.1 g/dL (ref 3.5–5.0)
Anion gap: 8 (ref 5–15)
BILIRUBIN TOTAL: 0.2 mg/dL — AB (ref 0.3–1.2)
BUN: 11 mg/dL (ref 6–20)
CHLORIDE: 101 mmol/L (ref 101–111)
CO2: 31 mmol/L (ref 22–32)
CREATININE: 0.58 mg/dL (ref 0.44–1.00)
Calcium: 9.9 mg/dL (ref 8.9–10.3)
GFR calc Af Amer: >60 mL/min (ref >60)
Glucose, Bld: 160 mg/dL — ABNORMAL HIGH (ref 65–99)
Potassium: 5.2 mmol/L — ABNORMAL HIGH (ref 3.5–5.1)
Sodium: 140 mmol/L (ref 135–145)
Total Protein: 7.2 g/dL (ref 6.5–8.1)

## 2015-05-06 LAB — CBC
HEMATOCRIT: 44.3 % (ref 36.0–46.0)
HEMOGLOBIN: 13.8 g/dL (ref 12.0–15.0)
MCH: 26.4 pg (ref 26.0–34.0)
MCHC: 31.2 g/dL (ref 30.0–36.0)
MCV: 84.9 fL (ref 78.0–100.0)
Platelets: 171 10*3/uL (ref 150–400)
RBC: 5.22 MIL/uL — AB (ref 3.87–5.11)
RDW: 13.5 % (ref 11.5–15.5)
WBC: 9.8 10*3/uL (ref 4.0–10.5)

## 2015-05-06 LAB — URINE MICROSCOPIC-ADD ON

## 2015-05-06 LAB — APTT: aPTT: 28 seconds (ref 24–37)

## 2015-05-06 LAB — PROTIME-INR
INR: 0.96 (ref 0.00–1.49)
PROTHROMBIN TIME: 13 s (ref 11.6–15.2)

## 2015-05-06 LAB — SURGICAL PCR SCREEN
MRSA, PCR: NEGATIVE
Staphylococcus aureus: NEGATIVE

## 2015-05-06 LAB — ABO/RH: ABO/RH(D): A POS

## 2015-05-07 LAB — HEMOGLOBIN A1C
HEMOGLOBIN A1C: 7.3 % — AB (ref 4.8–5.6)
MEAN PLASMA GLUCOSE: 163 mg/dL

## 2015-05-10 NOTE — Progress Notes (Signed)
Abnormal lab faxed to Dr.Swinteck

## 2015-05-12 ENCOUNTER — Ambulatory Visit: Payer: Self-pay | Admitting: Certified Nurse Midwife

## 2015-05-13 NOTE — Progress Notes (Signed)
Spoke with patient by phone and patient aware surgery time changed to 730 am, arrive 530 am 05-14-15 [facility] short stay

## 2015-05-14 ENCOUNTER — Inpatient Hospital Stay (HOSPITAL_COMMUNITY)
Admission: RE | Admit: 2015-05-14 | Discharge: 2015-05-16 | DRG: 470 | Disposition: A | Discharge status: Home Health | Payer: Commercial Managed Care - HMO | Source: Ambulatory Visit | Attending: Orthopedic Surgery | Admitting: Orthopedic Surgery

## 2015-05-14 ENCOUNTER — Inpatient Hospital Stay (HOSPITAL_COMMUNITY): Payer: Commercial Managed Care - HMO

## 2015-05-14 ENCOUNTER — Inpatient Hospital Stay (HOSPITAL_COMMUNITY): Payer: Commercial Managed Care - HMO | Admitting: Anesthesiology

## 2015-05-14 ENCOUNTER — Encounter (HOSPITAL_COMMUNITY)
Admission: RE | Disposition: A | Discharge status: Home Health | Payer: Self-pay | Source: Ambulatory Visit | Attending: Orthopedic Surgery

## 2015-05-14 ENCOUNTER — Encounter (HOSPITAL_COMMUNITY): Payer: Self-pay | Admitting: *Deleted

## 2015-05-14 DIAGNOSIS — M25562 Pain in left knee: Secondary | ICD-10-CM | POA: Diagnosis present

## 2015-05-14 DIAGNOSIS — Z01812 Encounter for preprocedural laboratory examination: Secondary | ICD-10-CM | POA: Diagnosis not present

## 2015-05-14 DIAGNOSIS — E119 Type 2 diabetes mellitus without complications: Secondary | ICD-10-CM | POA: Diagnosis present

## 2015-05-14 DIAGNOSIS — I10 Essential (primary) hypertension: Secondary | ICD-10-CM | POA: Diagnosis present

## 2015-05-14 DIAGNOSIS — M1712 Unilateral primary osteoarthritis, left knee: Secondary | ICD-10-CM | POA: Diagnosis present

## 2015-05-14 DIAGNOSIS — Z09 Encounter for follow-up examination after completed treatment for conditions other than malignant neoplasm: Secondary | ICD-10-CM

## 2015-05-14 HISTORY — PX: TOTAL KNEE ARTHROPLASTY: SHX125

## 2015-05-14 LAB — GLUCOSE, CAPILLARY
GLUCOSE-CAPILLARY: 159 mg/dL — AB (ref 65–99)
GLUCOSE-CAPILLARY: 173 mg/dL — AB (ref 65–99)
Glucose-Capillary: 149 mg/dL — ABNORMAL HIGH (ref 65–99)
Glucose-Capillary: 150 mg/dL — ABNORMAL HIGH (ref 65–99)
Glucose-Capillary: 180 mg/dL — ABNORMAL HIGH (ref 65–99)

## 2015-05-14 LAB — TYPE AND SCREEN
ABO/RH(D): A POS
Antibody Screen: NEGATIVE

## 2015-05-14 SURGERY — ARTHROPLASTY, KNEE, TOTAL
Anesthesia: Spinal | Site: Knee | Laterality: Left

## 2015-05-14 MED ORDER — DEXAMETHASONE SODIUM PHOSPHATE 10 MG/ML IJ SOLN
10.0000 mg | Freq: Once | INTRAMUSCULAR | Status: AC
  Administered 2015-05-15: 10 mg via INTRAVENOUS
  Filled 2015-05-14: qty 1

## 2015-05-14 MED ORDER — HYDROCODONE-ACETAMINOPHEN 5-325 MG PO TABS: 1.0000 | ORAL_TABLET | ORAL | Status: DC | PRN

## 2015-05-14 MED ORDER — FENTANYL CITRATE (PF) 100 MCG/2ML IJ SOLN
INTRAMUSCULAR | Status: AC
  Filled 2015-05-14: qty 4

## 2015-05-14 MED ORDER — PROPOFOL 500 MG/50ML IV EMUL
INTRAVENOUS | Status: DC | PRN
  Administered 2015-05-14: 50 ug/kg/min via INTRAVENOUS

## 2015-05-14 MED ORDER — MIDAZOLAM HCL 2 MG/2ML IJ SOLN
INTRAMUSCULAR | Status: AC
  Filled 2015-05-14: qty 4

## 2015-05-14 MED ORDER — ACETAMINOPHEN 650 MG RE SUPP: 650.0000 mg | Freq: Four times a day (QID) | RECTAL | Status: DC | PRN

## 2015-05-14 MED ORDER — KETOROLAC TROMETHAMINE 30 MG/ML IJ SOLN
INTRAMUSCULAR | Status: AC
  Filled 2015-05-14: qty 1

## 2015-05-14 MED ORDER — CHLORHEXIDINE GLUCONATE 4 % EX LIQD: 60.0000 mL | Freq: Once | CUTANEOUS | Status: DC

## 2015-05-14 MED ORDER — LACTATED RINGERS IV SOLN
INTRAVENOUS | Status: DC | PRN
  Administered 2015-05-14 (×3): via INTRAVENOUS

## 2015-05-14 MED ORDER — DIPHENHYDRAMINE HCL 12.5 MG/5ML PO ELIX: 12.5000 mg | ORAL_SOLUTION | ORAL | Status: DC | PRN

## 2015-05-14 MED ORDER — MIDAZOLAM HCL 5 MG/5ML IJ SOLN
INTRAMUSCULAR | Status: DC | PRN
  Administered 2015-05-14: 2 mg via INTRAVENOUS

## 2015-05-14 MED ORDER — OXYCODONE HCL ER 10 MG PO T12A: 10.0000 mg | EXTENDED_RELEASE_TABLET | ORAL | Status: DC

## 2015-05-14 MED ORDER — PROPOFOL 10 MG/ML IV BOLUS
INTRAVENOUS | Status: AC
  Filled 2015-05-14: qty 20

## 2015-05-14 MED ORDER — BUPIVACAINE-EPINEPHRINE (PF) 0.25% -1:200000 IJ SOLN
INTRAMUSCULAR | Status: DC | PRN
  Administered 2015-05-14: 30 mL

## 2015-05-14 MED ORDER — KETOROLAC TROMETHAMINE 15 MG/ML IJ SOLN
INTRAMUSCULAR | Status: AC
  Filled 2015-05-14: qty 1

## 2015-05-14 MED ORDER — CEFAZOLIN SODIUM-DEXTROSE 2-3 GM-% IV SOLR
2.0000 g | INTRAVENOUS | Status: AC
  Administered 2015-05-14: 2 g via INTRAVENOUS

## 2015-05-14 MED ORDER — FENTANYL CITRATE (PF) 100 MCG/2ML IJ SOLN
INTRAMUSCULAR | Status: DC | PRN
  Administered 2015-05-14 (×2): 100 ug via INTRAVENOUS

## 2015-05-14 MED ORDER — ONDANSETRON HCL 4 MG/2ML IJ SOLN
INTRAMUSCULAR | Status: AC
  Filled 2015-05-14: qty 2

## 2015-05-14 MED ORDER — ACETAMINOPHEN 10 MG/ML IV SOLN
1000.0000 mg | Freq: Once | INTRAVENOUS | Status: AC
  Administered 2015-05-14: 1000 mg via INTRAVENOUS

## 2015-05-14 MED ORDER — HYDROCODONE-ACETAMINOPHEN 5-325 MG PO TABS
1.0000 | ORAL_TABLET | ORAL | Status: DC | PRN
  Administered 2015-05-14: 2 via ORAL
  Administered 2015-05-14: 1 via ORAL
  Administered 2015-05-15 (×3): 2 via ORAL
  Administered 2015-05-15 – 2015-05-16 (×2): 1 via ORAL
  Filled 2015-05-14: qty 2
  Filled 2015-05-14 (×3): qty 1
  Filled 2015-05-14 (×4): qty 2

## 2015-05-14 MED ORDER — HYDROCHLOROTHIAZIDE 25 MG PO TABS
25.0000 mg | ORAL_TABLET | Freq: Every day | ORAL | Status: DC
  Administered 2015-05-14 – 2015-05-15 (×2): 25 mg via ORAL
  Filled 2015-05-14 (×3): qty 1

## 2015-05-14 MED ORDER — KETOROLAC TROMETHAMINE 30 MG/ML IJ SOLN
INTRAMUSCULAR | Status: DC | PRN
  Administered 2015-05-14: 30 mg via INTRAMUSCULAR

## 2015-05-14 MED ORDER — SODIUM CHLORIDE 0.9 % IJ SOLN
INTRAMUSCULAR | Status: DC | PRN
  Administered 2015-05-14: 30 mL via INTRAVENOUS

## 2015-05-14 MED ORDER — OXYCODONE HCL ER 10 MG PO T12A
10.0000 mg | EXTENDED_RELEASE_TABLET | Freq: Two times a day (BID) | ORAL | Status: DC
  Administered 2015-05-14 – 2015-05-16 (×5): 10 mg via ORAL
  Filled 2015-05-14 (×5): qty 1

## 2015-05-14 MED ORDER — KETOROLAC TROMETHAMINE 15 MG/ML IJ SOLN
15.0000 mg | Freq: Four times a day (QID) | INTRAMUSCULAR | Status: AC
  Administered 2015-05-14 – 2015-05-15 (×4): 15 mg via INTRAVENOUS
  Filled 2015-05-14 (×3): qty 1

## 2015-05-14 MED ORDER — POTASSIUM CHLORIDE CRYS ER 10 MEQ PO TBCR
10.0000 meq | EXTENDED_RELEASE_TABLET | Freq: Every day | ORAL | Status: DC
  Administered 2015-05-14 – 2015-05-16 (×3): 10 meq via ORAL
  Filled 2015-05-14 (×3): qty 1

## 2015-05-14 MED ORDER — SODIUM CHLORIDE 0.9 % IJ SOLN
INTRAMUSCULAR | Status: AC
  Filled 2015-05-14: qty 50

## 2015-05-14 MED ORDER — METHOCARBAMOL 500 MG PO TABS
500.0000 mg | ORAL_TABLET | Freq: Four times a day (QID) | ORAL | Status: DC | PRN
  Administered 2015-05-14 – 2015-05-15 (×2): 500 mg via ORAL
  Filled 2015-05-14 (×2): qty 1

## 2015-05-14 MED ORDER — SODIUM CHLORIDE 0.9 % IV SOLN
INTRAVENOUS | Status: DC
  Administered 2015-05-14 (×2): via INTRAVENOUS

## 2015-05-14 MED ORDER — ACETAMINOPHEN 10 MG/ML IV SOLN
INTRAVENOUS | Status: AC
  Filled 2015-05-14: qty 100

## 2015-05-14 MED ORDER — HYDROGEN PEROXIDE 3 % EX SOLN
CUTANEOUS | Status: AC
  Filled 2015-05-14: qty 473

## 2015-05-14 MED ORDER — MENTHOL 3 MG MT LOZG: 1.0000 | LOZENGE | OROMUCOSAL | Status: DC | PRN

## 2015-05-14 MED ORDER — PHENOL 1.4 % MT LIQD
1.0000 | OROMUCOSAL | Status: DC | PRN
  Filled 2015-05-14: qty 177

## 2015-05-14 MED ORDER — ONDANSETRON HCL 4 MG PO TABS: 4.0000 mg | ORAL_TABLET | Freq: Four times a day (QID) | ORAL | Status: DC | PRN

## 2015-05-14 MED ORDER — SODIUM CHLORIDE 0.9 % IR SOLN
Status: DC | PRN
  Administered 2015-05-14: 1000 mL

## 2015-05-14 MED ORDER — ATORVASTATIN CALCIUM 40 MG PO TABS
40.0000 mg | ORAL_TABLET | Freq: Every day | ORAL | Status: DC
  Administered 2015-05-14 – 2015-05-15 (×2): 40 mg via ORAL
  Filled 2015-05-14 (×3): qty 1

## 2015-05-14 MED ORDER — ONDANSETRON 8 MG PO TBDP: 8.0000 mg | ORAL_TABLET | Freq: Three times a day (TID) | ORAL | Status: DC | PRN

## 2015-05-14 MED ORDER — FENTANYL CITRATE (PF) 100 MCG/2ML IJ SOLN
25.0000 ug | INTRAMUSCULAR | Status: DC | PRN
  Administered 2015-05-14: 50 ug via INTRAVENOUS

## 2015-05-14 MED ORDER — SENNA 8.6 MG PO TABS
2.0000 | ORAL_TABLET | Freq: Every day | ORAL | Status: DC
  Administered 2015-05-14 – 2015-05-15 (×2): 17.2 mg via ORAL

## 2015-05-14 MED ORDER — BUPIVACAINE-EPINEPHRINE (PF) 0.25% -1:200000 IJ SOLN
INTRAMUSCULAR | Status: AC
  Filled 2015-05-14: qty 30

## 2015-05-14 MED ORDER — SENNA 8.6 MG PO TABS: 2.0000 | ORAL_TABLET | Freq: Every day | ORAL | Status: DC

## 2015-05-14 MED ORDER — METHOCARBAMOL 1000 MG/10ML IJ SOLN
500.0000 mg | Freq: Four times a day (QID) | INTRAVENOUS | Status: DC | PRN
  Administered 2015-05-14: 500 mg via INTRAVENOUS
  Filled 2015-05-14 (×3): qty 5

## 2015-05-14 MED ORDER — HYDROGEN PEROXIDE 3 % EX SOLN
CUTANEOUS | Status: DC | PRN
  Administered 2015-05-14: 1

## 2015-05-14 MED ORDER — DOCUSATE SODIUM 100 MG PO CAPS: 100.0000 mg | ORAL_CAPSULE | Freq: Two times a day (BID) | ORAL | Status: DC

## 2015-05-14 MED ORDER — APIXABAN 2.5 MG PO TABS: 2.5000 mg | ORAL_TABLET | Freq: Two times a day (BID) | ORAL | Status: DC

## 2015-05-14 MED ORDER — SODIUM CHLORIDE 0.9 % IR SOLN
Status: DC | PRN
  Administered 2015-05-14: 3000 mL

## 2015-05-14 MED ORDER — DOCUSATE SODIUM 100 MG PO CAPS
100.0000 mg | ORAL_CAPSULE | Freq: Two times a day (BID) | ORAL | Status: DC
  Administered 2015-05-14 – 2015-05-16 (×4): 100 mg via ORAL

## 2015-05-14 MED ORDER — ISOPROPYL ALCOHOL 70 % SOLN
Status: DC | PRN
  Administered 2015-05-14: 1 via TOPICAL

## 2015-05-14 MED ORDER — METOCLOPRAMIDE HCL 10 MG PO TABS: 5.0000 mg | ORAL_TABLET | Freq: Three times a day (TID) | ORAL | Status: DC | PRN

## 2015-05-14 MED ORDER — INSULIN ASPART 100 UNIT/ML ~~LOC~~ SOLN
0.0000 [IU] | Freq: Three times a day (TID) | SUBCUTANEOUS | Status: DC
  Administered 2015-05-14: 3 [IU] via SUBCUTANEOUS
  Administered 2015-05-15 (×2): 2 [IU] via SUBCUTANEOUS
  Administered 2015-05-15: 5 [IU] via SUBCUTANEOUS
  Administered 2015-05-16: 2 [IU] via SUBCUTANEOUS

## 2015-05-14 MED ORDER — SODIUM CHLORIDE 0.9 % IV SOLN: INTRAVENOUS | Status: DC

## 2015-05-14 MED ORDER — TRANEXAMIC ACID 1000 MG/10ML IV SOLN
1000.0000 mg | INTRAVENOUS | Status: AC
  Administered 2015-05-14: 1000 mg via INTRAVENOUS
  Filled 2015-05-14: qty 10

## 2015-05-14 MED ORDER — ACETAMINOPHEN 325 MG PO TABS: 650.0000 mg | ORAL_TABLET | Freq: Four times a day (QID) | ORAL | Status: DC | PRN

## 2015-05-14 MED ORDER — BUPIVACAINE HCL (PF) 0.75 % IJ SOLN
INTRAMUSCULAR | Status: DC | PRN
  Administered 2015-05-14: 15 mg via INTRATHECAL

## 2015-05-14 MED ORDER — ISOPROPYL ALCOHOL 70 % SOLN
Status: AC
  Filled 2015-05-14: qty 480

## 2015-05-14 MED ORDER — ALUM & MAG HYDROXIDE-SIMETH 200-200-20 MG/5ML PO SUSP: 30.0000 mL | ORAL | Status: DC | PRN

## 2015-05-14 MED ORDER — CEFAZOLIN SODIUM-DEXTROSE 2-3 GM-% IV SOLR
2.0000 g | Freq: Four times a day (QID) | INTRAVENOUS | Status: AC
  Administered 2015-05-14 (×2): 2 g via INTRAVENOUS
  Filled 2015-05-14 (×2): qty 50

## 2015-05-14 MED ORDER — LOSARTAN POTASSIUM 50 MG PO TABS
50.0000 mg | ORAL_TABLET | Freq: Two times a day (BID) | ORAL | Status: DC
  Administered 2015-05-14 – 2015-05-16 (×5): 50 mg via ORAL
  Filled 2015-05-14 (×6): qty 1

## 2015-05-14 MED ORDER — HYDROMORPHONE HCL 1 MG/ML IJ SOLN: 0.5000 mg | INTRAMUSCULAR | Status: DC | PRN

## 2015-05-14 MED ORDER — ONDANSETRON HCL 4 MG/2ML IJ SOLN
INTRAMUSCULAR | Status: DC | PRN
  Administered 2015-05-14: 4 mg via INTRAVENOUS

## 2015-05-14 MED ORDER — APIXABAN 2.5 MG PO TABS
2.5000 mg | ORAL_TABLET | Freq: Two times a day (BID) | ORAL | Status: DC
  Administered 2015-05-15 – 2015-05-16 (×3): 2.5 mg via ORAL
  Filled 2015-05-14 (×5): qty 1

## 2015-05-14 MED ORDER — METOCLOPRAMIDE HCL 5 MG/ML IJ SOLN: 5.0000 mg | Freq: Three times a day (TID) | INTRAMUSCULAR | Status: DC | PRN

## 2015-05-14 MED ORDER — DEXAMETHASONE SODIUM PHOSPHATE 10 MG/ML IJ SOLN
INTRAMUSCULAR | Status: AC
  Filled 2015-05-14: qty 1

## 2015-05-14 MED ORDER — ONDANSETRON HCL 4 MG/2ML IJ SOLN: 4.0000 mg | Freq: Four times a day (QID) | INTRAMUSCULAR | Status: DC | PRN

## 2015-05-14 MED ORDER — CEFAZOLIN SODIUM-DEXTROSE 2-3 GM-% IV SOLR
INTRAVENOUS | Status: AC
  Filled 2015-05-14: qty 50

## 2015-05-14 MED ORDER — FENTANYL CITRATE (PF) 100 MCG/2ML IJ SOLN
INTRAMUSCULAR | Status: AC
  Filled 2015-05-14: qty 2

## 2015-05-14 SURGICAL SUPPLY — 60 items
BAG SPEC THK2 15X12 ZIP CLS (MISCELLANEOUS)
BAG ZIPLOCK 12X15 (MISCELLANEOUS) IMPLANT
BANDAGE ELASTIC 4 VELCRO ST LF (GAUZE/BANDAGES/DRESSINGS) ×3 IMPLANT
BANDAGE ELASTIC 6 VELCRO ST LF (GAUZE/BANDAGES/DRESSINGS) ×3 IMPLANT
BANDAGE ESMARK 6X9 LF (GAUZE/BANDAGES/DRESSINGS) ×1 IMPLANT
BLADE SAW RECIPROCATING 77.5 (BLADE) ×3 IMPLANT
BNDG CMPR 9X6 STRL LF SNTH (GAUZE/BANDAGES/DRESSINGS) ×1
BNDG ESMARK 6X9 LF (GAUZE/BANDAGES/DRESSINGS) ×3
CAPT KNEE TRIATH TK-4 ×3 IMPLANT
CHLORAPREP W/TINT 26ML (MISCELLANEOUS) ×6 IMPLANT
CUFF TOURN SGL QUICK 34 (TOURNIQUET CUFF) ×2
CUFF TRNQT CYL 34X4X40X1 (TOURNIQUET CUFF) ×1 IMPLANT
DECANTER SPIKE VIAL GLASS SM (MISCELLANEOUS) ×3 IMPLANT
DRAPE EXTREMITY T 121X128X90 (DRAPE) ×3 IMPLANT
DRAPE LG THREE QUARTER DISP (DRAPES) ×6 IMPLANT
DRAPE POUCH INSTRU U-SHP 10X18 (DRAPES) ×3 IMPLANT
DRAPE U-SHAPE 47X51 STRL (DRAPES) ×3 IMPLANT
DRSG AQUACEL AG ADV 3.5X10 (GAUZE/BANDAGES/DRESSINGS) ×3 IMPLANT
DRSG TEGADERM 4X4.75 (GAUZE/BANDAGES/DRESSINGS) IMPLANT
ELECT PENCIL ROCKER SW 15FT (MISCELLANEOUS) ×3 IMPLANT
ELECT REM PT RETURN 15FT ADLT (MISCELLANEOUS) ×3 IMPLANT
EVACUATOR 1/8 PVC DRAIN (DRAIN) IMPLANT
FACESHIELD WRAPAROUND (MASK) ×9 IMPLANT
GAUZE SPONGE 4X4 12PLY STRL (GAUZE/BANDAGES/DRESSINGS) ×3 IMPLANT
GLOVE BIO SURGEON STRL SZ8.5 (GLOVE) ×6 IMPLANT
GLOVE BIOGEL PI IND STRL 8.5 (GLOVE) ×1 IMPLANT
GLOVE BIOGEL PI INDICATOR 8.5 (GLOVE) ×2
GOWN SPEC L3 XXLG W/TWL (GOWN DISPOSABLE) ×3 IMPLANT
HANDPIECE INTERPULSE COAX TIP (DISPOSABLE) ×3
HOOD PEEL AWAY FACE SHEILD DIS (HOOD) ×6 IMPLANT
KIT BASIN OR (CUSTOM PROCEDURE TRAY) ×3 IMPLANT
LIQUID BAND (GAUZE/BANDAGES/DRESSINGS) ×6 IMPLANT
MANIFOLD NEPTUNE II (INSTRUMENTS) ×3 IMPLANT
NEEDLE SPNL 18GX3.5 QUINCKE PK (NEEDLE) ×3 IMPLANT
PACK TOTAL JOINT (CUSTOM PROCEDURE TRAY) ×3 IMPLANT
PADDING CAST COTTON 6X4 STRL (CAST SUPPLIES) ×3 IMPLANT
PEN SKIN MARKING BROAD (MISCELLANEOUS) ×3 IMPLANT
POSITIONER SURGICAL ARM (MISCELLANEOUS) ×3 IMPLANT
SAW OSC TIP CART 19.5X105X1.3 (SAW) ×3 IMPLANT
SEALER BIPOLAR AQUA 6.0 (INSTRUMENTS) ×3 IMPLANT
SET HNDPC FAN SPRY TIP SCT (DISPOSABLE) ×1 IMPLANT
SET PAD KNEE POSITIONER (MISCELLANEOUS) ×3 IMPLANT
SOL PREP POV-IOD 4OZ 10% (MISCELLANEOUS) ×3 IMPLANT
SPONGE DRAIN TRACH 4X4 STRL 2S (GAUZE/BANDAGES/DRESSINGS) IMPLANT
SUCTION FRAZIER 12FR DISP (SUCTIONS) IMPLANT
SUT MNCRL AB 3-0 PS2 18 (SUTURE) ×3 IMPLANT
SUT MON AB 2-0 CT1 36 (SUTURE) ×6 IMPLANT
SUT VIC AB 1 CT1 36 (SUTURE) ×12 IMPLANT
SUT VIC AB 2-0 CT1 27 (SUTURE) ×2
SUT VIC AB 2-0 CT1 TAPERPNT 27 (SUTURE) ×1 IMPLANT
SUT VLOC 180 0 24IN GS25 (SUTURE) ×3 IMPLANT
SYR 50ML LL SCALE MARK (SYRINGE) ×3 IMPLANT
TOWEL OR 17X26 10 PK STRL BLUE (TOWEL DISPOSABLE) ×6 IMPLANT
TOWEL OR NON WOVEN STRL DISP B (DISPOSABLE) ×3 IMPLANT
TOWER CARTRIDGE SMART MIX (DISPOSABLE) IMPLANT
TRAY FOLEY W/METER SILVER 14FR (SET/KITS/TRAYS/PACK) ×3 IMPLANT
TRAY FOLEY W/METER SILVER 16FR (SET/KITS/TRAYS/PACK) ×3 IMPLANT
WATER STERILE IRR 1500ML POUR (IV SOLUTION) ×3 IMPLANT
WRAP KNEE MAXI GEL POST OP (GAUZE/BANDAGES/DRESSINGS) ×3 IMPLANT
YANKAUER SUCT BULB TIP 10FT TU (MISCELLANEOUS) ×3 IMPLANT

## 2015-05-14 NOTE — Evaluation (Signed)
Physical Therapy Evaluation Patient Details Name: Tracy Bonilla MRN: [id number] DOB: [birth year] Today's Date: 05/14/2015   History of Present Illness  L TKR   Clinical Impression  Pt s/p L TKR presents with decreased L LE strength/ROM and post op pain limiting functional mobility.  Pt should progress to dc home with family assist and HHPT follow up.    Follow Up Recommendations Home health PT    Equipment Recommendations  Rolling walker with 5" wheels    Recommendations for Other Services OT consult     Precautions / Restrictions Precautions Precautions: Knee;Fall Required Braces or Orthoses: Knee Immobilizer - Left Knee Immobilizer - Left: Discontinue once straight leg raise with < 10 degree lag Restrictions Weight Bearing Restrictions: No Other Position/Activity Restrictions: WBAT      Mobility  Bed Mobility Overal bed mobility: Needs Assistance Bed Mobility: Supine to Sit     Supine to sit: Mod assist     General bed mobility comments: cues for sequence and use of R LE to self assist.  Physical assist to manage L LE and to control trunk  Transfers Overall transfer level: Needs assistance Equipment used: Rolling walker (2 wheeled) Transfers: Sit to/from Stand Sit to Stand: Min assist;Mod assist;+2 physical assistance;+2 safety/equipment         General transfer comment: cues for LE management and use of UEs to self assist  Ambulation/Gait Ambulation/Gait assistance: Mod assist;+2 physical assistance;+2 safety/equipment Ambulation Distance (Feet): 5 Feet Assistive device: Rolling walker (2 wheeled) Gait Pattern/deviations: Step-to pattern;Decreased step length - right;Decreased step length - left;Shuffle;Trunk flexed Gait velocity: decr   General Gait Details: cues for sequence, posture and position from [employer]            Wheelchair Mobility    Modified Rankin (Stroke Patients Only)       Balance                                              Pertinent Vitals/Pain Pain Assessment: 0-10 Pain Score: 5  Pain Location: L knee Pain Descriptors / Indicators: Aching;Sore Pain Intervention(s): Limited activity within patient's tolerance;Monitored during session;Premedicated before session;Ice applied    Home Living Family/patient expects to be discharged to:: Private residence Living Arrangements: Spouse/significant other Available Help at Discharge: Family Type of Home: House Home Access: Stairs to enter Entrance Stairs-Rails: Right;Left;Can reach both Secretary/administrator of Steps: 2 Home Layout: Able to live on main level with bedroom/bathroom Home Equipment: Cane - single point      Prior Function Level of Independence: Independent;Independent with assistive device(s)               Hand Dominance        Extremity/Trunk Assessment   Upper Extremity Assessment: Overall WFL for tasks assessed           Lower Extremity Assessment: LLE deficits/detail   LLE Deficits / Details: 2/5 quads with AAROM -10 - 45  Cervical / Trunk Assessment: Normal  Communication   Communication: No difficulties  Cognition Arousal/Alertness: Awake/alert Behavior During Therapy: WFL for tasks assessed/performed Overall Cognitive Status: Within Functional Limits for tasks assessed                      General Comments      Exercises Total Joint Exercises Ankle Circles/Pumps: AROM;Both;15 reps;Supine Quad Sets: AROM;Both;10 reps;Supine Heel Slides:  AAROM;Left;10 reps;Supine Hip ABduction/ADduction: AAROM;Left;10 reps;Supine      Assessment/Plan    PT Assessment Patient needs continued PT services  PT Diagnosis Difficulty walking   PT Problem List Decreased strength;Decreased range of motion;Decreased activity tolerance;Decreased balance;Decreased knowledge of use of DME;Obesity;Pain  PT Treatment Interventions DME instruction;Gait training;Stair training;Functional mobility  training;Therapeutic activities;Therapeutic exercise;Patient/family education   PT Goals (Current goals can be found in the Care Plan section) Acute Rehab PT Goals Patient Stated Goal: Resume previous lifestyle with decreased pain PT Goal Formulation: With patient Time For Goal Achievement: 05/21/15 Potential to Achieve Goals: Good    Frequency 7X/week   Barriers to discharge        Co-evaluation               End of Session Equipment Utilized During Treatment: Gait belt;Left knee immobilizer Activity Tolerance: Patient tolerated treatment well;Patient limited by fatigue Patient left: in bed;with call bell/phone within reach;with family/visitor present Nurse Communication: Mobility status         Time: 1510-1605 PT Time Calculation (min) (ACUTE ONLY): 55 min   Charges:   PT Evaluation $Initial PT Evaluation Tier I: 1 Procedure PT Treatments $Gait Training: 8-22 mins $Therapeutic Exercise: 8-22 mins $Therapeutic Activity: 8-22 mins   PT G Codes:        BRADSHAW,HUNTER 01-Jun-2015, 5:01 PM

## 2015-05-14 NOTE — Discharge Instructions (Signed)
Dr. Samson Frederic Total Joint Specialist [facility] [address] [place], [place] [address] [phone]  TOTAL KNEE REPLACEMENT POSTOPERATIVE DIRECTIONS    Knee Rehabilitation, Guidelines Following Surgery  Results after knee surgery are often greatly improved when you follow the exercise, range of motion and muscle strengthening exercises prescribed by your doctor. Safety measures are also important to protect the knee from further injury. Any time any of these exercises cause you to have increased pain or swelling in your knee joint, decrease the amount until you are comfortable again and slowly increase them. If you have problems or questions, call your caregiver or physical therapist for advice.   WEIGHT BEARING Weight bearing as tolerated with assist device (walker, cane, etc) as directed, use it as long as suggested by your surgeon or therapist, typically at least 4-6 weeks. Wear knee immobilizer while up and walking.  HOME CARE INSTRUCTIONS  Remove items at home which could result in a fall. This includes throw rugs or furniture in walking pathways.  Continue medications as instructed at time of discharge. You may have some home medications which will be placed on hold until you complete the course of blood thinner medication.  You may start showering once you are discharged home but do not submerge the incision under water. Just pat the incision dry and apply a dry gauze dressing on daily. Walk with walker as instructed.  You may resume a sexual relationship in one month or when given the OK by your doctor.   Use walker as long as suggested by your caregivers.  Avoid periods of inactivity such as sitting longer than an hour when not asleep. This helps prevent blood clots.  You may put full weight on your legs and walk as much as is comfortable.  You may return to work once you are cleared by your doctor.  Do not drive a car for 6 weeks or until  released by you surgeon.   Do not drive while taking narcotics.  Wear the elastic stockings for three weeks following surgery during the day but you may remove then at night. Make sure you keep all of your appointments after your operation with all of your doctors and caregivers. You should call the office at the above phone number and make an appointment for approximately two weeks after the date of your surgery. Do not remove your surgical dressing. The dressing is waterproof; you may take showers in 3 days, but do not take tub baths or submerge the dressing. Please pick up a stool softener and laxative for home use as long as you are requiring pain medications.  ICE to the affected knee every three hours for 30 minutes at a time and then as needed for pain and swelling.  Continue to use ice on the knee for pain and swelling from surgery. You may notice swelling that will progress down to the foot and ankle.  This is normal after surgery.  Elevate the leg when you are not up walking on it.   It is important for you to complete the blood thinner medication as prescribed by your doctor.  Continue to use the breathing machine which will help keep your temperature down.  It is common for your temperature to cycle up and down following surgery, especially at night when you are not up moving around and exerting yourself.  The breathing machine keeps your lungs expanded and your temperature down.  RANGE OF MOTION AND STRENGTHENING EXERCISES  Rehabilitation  of the knee is important following a knee injury or an operation. After just a few days of immobilization, the muscles of the thigh which control the knee become weakened and shrink (atrophy). Knee exercises are designed to build up the tone and strength of the thigh muscles and to improve knee motion. Often times heat used for twenty to thirty minutes before working out will loosen up your tissues and help with improving the range of motion but do not  use heat for the first two weeks following surgery. These exercises can be done on a training (exercise) mat, on the floor, on a table or on a bed. Use what ever works the best and is most comfortable for you Knee exercises include:  Leg Lifts - While your knee is still immobilized in a splint or cast, you can do straight leg raises. Lift the leg to 60 degrees, hold for 3 sec, and slowly lower the leg. Repeat 10-20 times 2-3 times daily. Perform this exercise against resistance later as your knee gets better.  Quad and Hamstring Sets - Tighten up the muscle on the front of the thigh (Quad) and hold for 5-10 sec. Repeat this 10-20 times hourly. Hamstring sets are done by pushing the foot backward against an object and holding for 5-10 sec. Repeat as with quad sets.  A rehabilitation program following serious knee injuries can speed recovery and prevent re-injury in the future due to weakened muscles. Contact your doctor or a physical therapist for more information on knee rehabilitation.   SKILLED REHAB INSTRUCTIONS: If the patient is transferred to a skilled rehab facility following release from the hospital, a list of the current medications will be sent to the facility for the patient to continue.  When discharged from the skilled rehab facility, please have the facility set up the patient's Home Health Physical Therapy prior to being released. Also, the skilled facility will be responsible for providing the patient with their medications at time of release from the facility to include their pain medication, the muscle relaxants, and their blood thinner medication. If the patient is still at the rehab facility at time of the two week follow up appointment, the skilled rehab facility will also need to assist the patient in arranging follow up appointment in our office and any transportation needs.  MAKE SURE YOU:  Understand these instructions.  Will watch your condition.  Will get help right away if you  are not doing well or get worse.    Pick up stool softner and laxative for home use following surgery while on pain medications. Do NOT remove your dressing. You may shower.  Do not take tub baths or submerge incision under water. May shower starting three days after surgery. Please use a clean towel to pat the incision dry following showers. Continue to use ice for pain and swelling after surgery. Do not use any lotions or creams on the incision until instructed by your surgeon. Wear immobilizer while up and walking.

## 2015-05-14 NOTE — Transfer of Care (Signed)
Immediate Anesthesia Transfer of Care Note  Patient: Tracy Bonilla  Procedure(s) Performed: Procedure(s): LEFT TOTAL KNEE ARTHROPLASTY (Left)  Patient Location: PACU  Anesthesia Type:Spinal  Level of Consciousness: awake, alert  and oriented  Airway & Oxygen Therapy: Patient Spontanous Breathing and Patient connected to face mask oxygen  Post-op Assessment: Report given to RN, Post -op Vital signs reviewed and stable and SAB level S1.  Post vital signs: Reviewed and stable  Last Vitals:  Filed Vitals:   05/14/15 0551  BP: 142/66  Pulse: 88  Temp: 36.4 C  Resp: 18    Complications: No apparent anesthesia complications

## 2015-05-14 NOTE — Progress Notes (Signed)
Portable AP and Lateral Left Knee X-rays done. 

## 2015-05-14 NOTE — Interval H&P Note (Signed)
History and Physical Interval Note:  05/14/2015 7:12 AM  Tracy Bonilla  has presented today for surgery, with the diagnosis of left knee osteoarthritis  The various methods of treatment have been discussed with the patient and family. After consideration of risks, benefits and other options for treatment, the patient has consented to  Procedure(s): LEFT TOTAL KNEE ARTHROPLASTY (Left) as a surgical intervention .  The patient's history has been reviewed, patient examined, no change in status, stable for surgery.  I have reviewed the patient's chart and labs.  Questions were answered to the patient's satisfaction.     Norbert Malkin, Cloyde Reams

## 2015-05-14 NOTE — Anesthesia Preprocedure Evaluation (Signed)
Anesthesia Evaluation  Patient identified by MRN, date of birth, ID band Patient awake    Reviewed: Allergy & Precautions, H&P , Patient's Chart, lab work & pertinent test results  Airway Mallampati: II  TM Distance: >3 FB Neck ROM: full    Dental no notable dental hx.    Pulmonary    Pulmonary exam normal breath sounds clear to auscultation       Cardiovascular Exercise Tolerance: Good hypertension,  Rhythm:regular Rate:Normal     Neuro/Psych    GI/Hepatic   Endo/Other  diabetes  Renal/GU      Musculoskeletal   Abdominal   Peds  Hematology   Anesthesia Other Findings   Reproductive/Obstetrics                             Anesthesia Physical Anesthesia Plan  ASA: II  Anesthesia Plan: Spinal   Post-op Pain Management:    Induction:   Airway Management Planned:   Additional Equipment:   Intra-op Plan:   Post-operative Plan:   Informed Consent: I have reviewed the patients History and Physical, chart, labs and discussed the procedure including the risks, benefits and alternatives for the proposed anesthesia with the patient or authorized representative who has indicated his/her understanding and acceptance.   Dental Advisory Given  Plan Discussed with: CRNA  Anesthesia Plan Comments: (Lab work confirmed with CRNA in room. Platelets okay. Discussed spinal anesthetic, and patient consents to the procedure:  included risk of possible headache,backache, failed block, allergic reaction, and nerve injury. This patient was asked if she had any questions or concerns before the procedure started. )        Anesthesia Quick Evaluation  

## 2015-05-14 NOTE — Anesthesia Postprocedure Evaluation (Signed)
  Anesthesia Post-op Note  Patient: Tracy Bonilla  Procedure(s) Performed: Procedure(s): LEFT TOTAL KNEE ARTHROPLASTY (Left)  Patient is awake, responsive, moving her legs, and has signs of resolution of her numbness. Pain and nausea are reasonably well controlled. Vital signs are stable and clinically acceptable. Oxygen saturation is clinically acceptable. There are no apparent anesthetic complications at this time. Patient is ready for discharge.

## 2015-05-14 NOTE — Progress Notes (Signed)
X-ray results noted 

## 2015-05-14 NOTE — H&P (View-Only) (Signed)
TOTAL KNEE ADMISSION H&P  Patient is being admitted for left total knee arthroplasty.  Subjective:  Chief Complaint:left knee pain.  HPI: Tracy Bonilla, 64 y.o. female, has a history of pain and functional disability in the left knee due to arthritis and has failed non-surgical conservative treatments for greater than 12 weeks to includeNSAID's and/or analgesics, corticosteriod injections, flexibility and strengthening excercises, supervised PT with diminished ADL's post treatment, use of assistive devices, weight reduction as appropriate and activity modification.  Onset of symptoms was gradual, starting 1 years ago with rapidlly worsening course since that time. The patient noted prior procedures on the knee to include  arthroscopy on the left knee(s).  Patient currently rates pain in the left knee(s) at 10 out of 10 with activity. Patient has night pain, worsening of pain with activity and weight bearing, pain that interferes with activities of daily living, pain with passive range of motion, crepitus and joint swelling.  Patient has evidence of subchondral cysts, subchondral sclerosis, periarticular osteophytes and joint space narrowing by imaging studies.  There is no active infection.  Patient Active Problem List   Diagnosis Date Noted  . Unspecified essential hypertension 03/03/2014    Class: History of  . Type 2 diabetes mellitus 03/03/2014    Class: History of  . CONTRACTURE OF SHOULDER JOINT 12/03/2007  . SHOULDER PAIN 12/03/2007  . IMPINGEMENT SYNDROME 12/03/2007   Past Medical History  Diagnosis Date  . Hypertension   . Hyperlipidemia   . Anemia   . Diabetes mellitus without complication     Past Surgical History  Procedure Laterality Date  . Extra digit removed      thumb  . Foot surgery Right   . Arthroscopic surgery Left   . Breast surgery Left     1998  . Dilation and curettage of uterus    . Refractive surgery      both eyes  . Cataract surgery    . Tubal  ligation       (Not in a hospital admission) No Known Allergies  Social History  Substance Use Topics  . Smoking status: Never Smoker   . Smokeless tobacco: Never Used  . Alcohol Use: No    Family History  Problem Relation Age of Onset  . Cancer Mother     liver  . Hypertension Mother   . Heart disease Mother     CVD  . Cancer Father     lymphoma     Review of Systems  Constitutional: Negative.   HENT: Negative.   Eyes: Negative.   Cardiovascular: Negative.   Gastrointestinal: Negative.   Genitourinary: Negative.   Musculoskeletal: Positive for joint pain.  Skin: Negative.   Neurological: Positive for dizziness. Negative for tingling, tremors, sensory change, speech change, focal weakness, seizures and loss of consciousness.  Endo/Heme/Allergies: Negative.   Psychiatric/Behavioral: Negative.     Objective:  Physical Exam  Constitutional: She is oriented to person, place, and time. She appears well-developed and well-nourished.  HENT:  Head: Normocephalic and atraumatic.  Eyes: Conjunctivae and EOM are normal. Pupils are equal, round, and reactive to light.  Neck: Normal range of motion. Neck supple.  Cardiovascular: Normal rate and regular rhythm.   Respiratory: Effort normal and breath sounds normal.  GI: Soft. Bowel sounds are normal. She exhibits no distension. There is no tenderness.  Genitourinary:  deferred  Musculoskeletal:       Left knee: She exhibits decreased range of motion, swelling, effusion and bony tenderness.    Neurological: She is oriented to person, place, and time. She has normal reflexes.  Skin: Skin is warm and dry.  Psychiatric: She has a normal mood and affect. Her behavior is normal. Judgment and thought content normal.    Vital signs in last 24 hours: @  Labs:   Estimated body mass index is 31.44 kg/(m^2) as calculated from the following:   Height as of 04/23/14: 5' 3.25" (1.607 m).   Weight as of 04/23/14: 81.194 kg (179  lb).   Imaging Review Plain radiographs demonstrate severe degenerative joint disease of the left knee(s). The overall alignment issignificant varus. The bone quality appears to be adequate for age and reported activity level.  Assessment/Plan:  End stage arthritis, left knee   The patient history, physical examination, clinical judgment of the provider and imaging studies are consistent with end stage degenerative joint disease of the left knee(s) and total knee arthroplasty is deemed medically necessary. The treatment options including medical management, injection therapy arthroscopy and arthroplasty were discussed at length. The risks and benefits of total knee arthroplasty were presented and reviewed. The risks due to aseptic loosening, infection, stiffness, patella tracking problems, thromboembolic complications and other imponderables were discussed. The patient acknowledged the explanation, agreed to proceed with the plan and consent was signed. Patient is being admitted for inpatient treatment for surgery, pain control, PT, OT, prophylactic antibiotics, VTE prophylaxis, progressive ambulation and ADL's and discharge planning. The patient is planning to be discharged home with home health services

## 2015-05-14 NOTE — Discharge Summary (Signed)
Physician Discharge Summary  Patient ID: Tracy Bonilla MRN: [id number] DOB/AGE: 1951-08-18 64 y.o.  Admit date: 05/14/2015 Discharge date: 05/16/2015  Admission Diagnoses:  Primary osteoarthritis of left knee  Discharge Diagnoses:  Principal Problem:   Primary osteoarthritis of left knee   Past Medical History  Diagnosis Date  . Hypertension   . Hyperlipidemia   . Diabetes mellitus without complication   . Complication of anesthesia   . PONV (postoperative nausea and vomiting)   . Arthritis     Surgeries: Procedure(s): LEFT TOTAL KNEE ARTHROPLASTY on 05/14/2015   Consultants (if any):    Discharged Condition: Improved  Hospital Course: Tracy Bonilla is an 64 y.o. female who was admitted 05/14/2015 with a diagnosis of Primary osteoarthritis of left knee and went to the operating room on 05/14/2015 and underwent the above named procedures.    She was given perioperative antibiotics:      Anti-infectives    Start     Dose/Rate Route Frequency Ordered Stop   05/14/15 1400  ceFAZolin (ANCEF) IVPB 2 g/50 mL premix     2 g 100 mL/hr over 30 Minutes Intravenous Every 6 hours 05/14/15 1231 05/14/15 2113   05/14/15 0614  ceFAZolin (ANCEF) IVPB 2 g/50 mL premix     2 g 100 mL/hr over 30 Minutes Intravenous On call to O.R. 05/14/15 4098 05/14/15 0734    .  She was given sequential compression devices, early ambulation, and apixaban for DVT prophylaxis. She was WBAT with knee immobilizer while ambulating.  She benefited maximally from the hospital stay and there were no complications.    Recent vital signs:  Filed Vitals:   05/16/15 0433  BP: 141/58  Pulse: 71  Temp: 98.1 F (36.7 C)  Resp: 16    Recent laboratory studies:  Lab Results  Component Value Date   HGB 11.8* 05/16/2015   HGB 11.0* 05/15/2015   HGB 13.8 05/06/2015   Lab Results  Component Value Date   WBC 10.4 05/16/2015   PLT 189 05/16/2015   Lab Results  Component Value Date   INR 0.96 05/06/2015    Lab Results  Component Value Date   NA 137 05/15/2015   K 3.4* 05/15/2015   CL 103 05/15/2015   CO2 27 05/15/2015   BUN 7 05/15/2015   CREATININE <0.30* 05/15/2015   GLUCOSE 126* 05/15/2015    Discharge Medications:     Medication List    STOP taking these medications        estradiol-norethindrone 1-0.5 MG per tablet  Commonly known as:  ACTIVELLA      TAKE these medications        apixaban 2.5 MG Tabs tablet  Commonly known as:  ELIQUIS  Take 1 tablet (2.5 mg total) by mouth 2 (two) times daily.     atorvastatin 40 MG tablet  Commonly known as:  LIPITOR  Take 40 mg by mouth daily at 6 PM.     docusate sodium 100 MG capsule  Commonly known as:  COLACE  Take 1 capsule (100 mg total) by mouth 2 (two) times daily.     estradiol 2 MG tablet  Commonly known as:  ESTRACE  Take 2 mg by mouth every evening.     Fish Oil 1000 MG Caps  Take by mouth.     hydrochlorothiazide 25 MG tablet  Commonly known as:  HYDRODIURIL  Take 25 mg by mouth daily. With lunch     HYDROcodone-acetaminophen 5-325 MG per tablet  Commonly known as:  NORCO  Take 1-2 tablets by mouth every 4 (four) hours as needed for moderate pain.     INVOKANA 100 MG Tabs tablet  Generic drug:  canagliflozin  Take 100 mg by mouth daily.     losartan 50 MG tablet  Commonly known as:  COZAAR  Take 50 mg by mouth 2 (two) times daily.     metFORMIN 500 MG tablet  Commonly known as:  GLUCOPHAGE  Take 500 mg by mouth 2 (two) times daily with a meal.     ondansetron 8 MG disintegrating tablet  Commonly known as:  ZOFRAN ODT  Take 1 tablet (8 mg total) by mouth every 8 (eight) hours as needed for nausea or vomiting.     OSCAL 500/200 D-3 500-200 MG-UNIT per tablet  Generic drug:  calcium-vitamin D  Take 1 tablet by mouth daily.     OxyCODONE 10 mg T12a 12 hr tablet  Commonly known as:  OXYCONTIN  Take 1 tablet (10 mg total) by mouth PRO. 1 tab PO every 12 hours for 3 days, then 1 tab PO daily for  4 days     potassium chloride 10 MEQ tablet  Commonly known as:  K-DUR,KLOR-CON  Take 10 mEq by mouth every morning.     senna 8.6 MG Tabs tablet  Commonly known as:  SENOKOT  Take 2 tablets (17.2 mg total) by mouth at bedtime.        Diagnostic Studies: Dg Knee Left Port  05/14/2015   CLINICAL DATA:  Insert subsequent total knee replacement  EXAM: PORTABLE LEFT KNEE - 1-2 VIEW  COMPARISON:  Preoperative film from 12/08/2014.  FINDINGS: Two views study shows tricompartmental total knee replacement. No evidence for immediate hardware complications. Gas in the soft tissues and joint space is compatible with the immediate postoperative state.  IMPRESSION: No evidence for complicating features status post left total knee replacement.   Electronically Signed   By: Kennith Center M.D.   On: 05/14/2015 11:46    Disposition: Final discharge disposition not confirmed  Discharge Instructions    Call MD / Call 911    Complete by:  As directed   If you experience chest pain or shortness of breath, CALL 911 and be transported to the hospital emergency room.  If you develope a fever above 101 F, pus (white drainage) or increased drainage or redness at the wound, or calf pain, call your surgeon's office.     Constipation Prevention    Complete by:  As directed   Drink plenty of fluids.  Prune juice may be helpful.  You may use a stool softener, such as Colace (over the counter) 100 mg twice a day.  Use MiraLax (over the counter) for constipation as needed.     Diet Carb Modified    Complete by:  As directed      Increase activity slowly as tolerated    Complete by:  As directed            Follow-up Information    Follow up with Swinteck, Cloyde Reams, MD. Schedule an appointment as soon as possible for a visit in 2 weeks.   Specialty:  Orthopedic Surgery   Why:  For wound re-check   Contact information:   [address] [place] [address] [phone]       Follow up with  [facility].   Why:  home health physical therapy   Contact information:   [address] [place] [address] [phone]        Signed: Garnet Koyanagi 05/16/2015, 10:59 AM

## 2015-05-14 NOTE — Anesthesia Procedure Notes (Signed)
Spinal Patient location during procedure: OR Start time: 05/14/2015 7:27 AM End time: 05/14/2015 7:33 AM Staffing Resident/CRNA: Harle Stanford R Performed by: resident/CRNA  Preanesthetic Checklist Completed: patient identified, site marked, surgical consent, pre-op evaluation, timeout performed, IV checked, risks and benefits discussed and monitors and equipment checked Spinal Block Patient position: sitting Prep: Betadine Patient monitoring: heart rate, cardiac monitor, continuous pulse ox and blood pressure Approach: midline Location: L3-4 Needle Needle type: Spinocan  Needle gauge: 22 G Needle length: 9 cm Needle insertion depth: 7 cm Assessment Sensory level: T6 Additional Notes Timeout performed. Spinal kit expiration date checked.

## 2015-05-14 NOTE — Op Note (Signed)
OPERATIVE REPORT  SURGEON: Samson Frederic, MD.   ASSISTANT: Velta Addison, RNFA.  PREOPERATIVE DIAGNOSIS: Left knee arthritis.   POSTOPERATIVE DIAGNOSIS: Left knee arthritis.   PROCEDURE: Left total knee arthroplasty.   IMPLANTS: [employer] Triathlon CR femur, size 3. St[employer]ker Triathlon Tritanium tibia, size 2. X3 polyethelyene insert, size 11 mm, CS. Tritanium 3 button asymmetric patella, size 29 mm.  ANESTHESIA:  Spinal  TOURNIQUET TIME: not utilized.  ESTIMATED BLOOD LOSS:-* No blood loss amount entered *    ANTIBIOTICS: 2g ancef..  DRAINS: None.  COMPLICATIONS: Partial thickness laceration to MCL.   CONDITION: PACU - hemodynamically stable.   BRIEF CLINICAL NOTE: Tracy Bonilla is a 64 y.o. female with a long-standing history of Left knee arthritis. After failing conservative management, the patient was indicated for total knee arthroplasty. The risks, benefits, and alternatives to the procedure were explained, and the patient elected to proceed.  PROCEDURE IN DETAIL: Spinal anesthesia was obtained in the pre-op holding area. Once inside the operative room, a foley catheter was inserted. The patient was then positioned, a nonsterile tourniquet was placed, and the lower extremity was prepped and draped in the normal sterile surgical fashion. A time-out was called verifying side and site of surgery. The patient received IV antibiotics within 60 minutes of beginning the procedure. The tourniquet was not utilized.  An anterior approach to the knee was performed utilizing a midvastus arthrotomy. A medial release was performed and the patellar fat pad was excised. A 5-degree distal femur valgus cut was made with an intramedullary guide. A freehand patellar resection was performed, and the patella was sized an prepared with 3 lug holes.  The proximal tibial resection was performed using an extramedullary guide, leaving a bone island for the PCL. She had a cyst at the PCL  insertion which I curetted to a stable base. The PCL was attenuated.The menisci were excised. A spacer block was placed, and the alignment and balance in extension were confirmed.   The distal femur was sized using the 3-degree external rotation guide referencing the posterior femoral cortex. The appropriate 4-in-1 cutting block was pinned into place, and the rotation was checked using a spacer block at 90 degrees of flexion. The remaining femoral cuts were performed, taking care to protect the MCL.  The tibia was sized and the trial tray was pinned into place. The remaining trail components were inserted. The knee was stable to varus and valgus stress through a full range of motion. The patella tracked centrally, and the PCL was well balanced. The trial components were removed, and the proximal tibial surface was prepared. Final components were impacted into place. The knee was tested for a final time and found to be have mild medial laxity. I inspected her MCL and there was a partial thickness laceration which I repaired with an interrupted #1 suture. Then, her varus / valgus stability was excellent.  The wound was copiously irrigated with a dilute betadine solution followed by normal saline with pulse lavage. Marcaine solution was injected into the periarticular soft tissue. The wound was closed in layers using #1 Vicryl and V-Loc for the fascia, 2-0 Vicryl for the subcutaneous fat, 2-0 Monocryl for the deep dermal layer, 3-0 running Monocryl subcuticular Stitch, and Dermabond for the skin. Once the glue was fully dried, an Aquacell Ag and compressive dressing were applied. A knee immobilizer was placed. The patient was transported to the recovery room in stable ondition. Sponge, needle, and instrument counts were correct at the end of  the case x2. The patient tolerated the procedure well.

## 2015-05-15 LAB — BASIC METABOLIC PANEL
Anion gap: 7 (ref 5–15)
BUN: 7 mg/dL (ref 6–20)
CALCIUM: 8.1 mg/dL — AB (ref 8.9–10.3)
CHLORIDE: 103 mmol/L (ref 101–111)
CO2: 27 mmol/L (ref 22–32)
Creatinine, Ser: <0.3 mg/dL — ABNORMAL LOW (ref 0.44–1.00)
GLUCOSE: 126 mg/dL — AB (ref 65–99)
Potassium: 3.4 mmol/L — ABNORMAL LOW (ref 3.5–5.1)
Sodium: 137 mmol/L (ref 135–145)

## 2015-05-15 LAB — GLUCOSE, CAPILLARY
GLUCOSE-CAPILLARY: 138 mg/dL — AB (ref 65–99)
GLUCOSE-CAPILLARY: 228 mg/dL — AB (ref 65–99)
GLUCOSE-CAPILLARY: 184 mg/dL — AB (ref 65–99)
Glucose-Capillary: 124 mg/dL — ABNORMAL HIGH (ref 65–99)

## 2015-05-15 LAB — CBC
HEMATOCRIT: 35 % — AB (ref 36.0–46.0)
HEMOGLOBIN: 11 g/dL — AB (ref 12.0–15.0)
MCH: 26.5 pg (ref 26.0–34.0)
MCHC: 31.4 g/dL (ref 30.0–36.0)
MCV: 84.3 fL (ref 78.0–100.0)
Platelets: 179 10*3/uL (ref 150–400)
RBC: 4.15 MIL/uL (ref 3.87–5.11)
RDW: 13.3 % (ref 11.5–15.5)
WBC: 12.4 10*3/uL — ABNORMAL HIGH (ref 4.0–10.5)

## 2015-05-15 NOTE — Progress Notes (Signed)
Prescriptions given to pt's spouse to fill in case of discharge on Sunday. Meds in prescription are Norco, Oxycontin, Zofran, Eliquis, Colace and Senokot. Explained which ones are OTC. Cyril Railey, [employer]

## 2015-05-15 NOTE — Progress Notes (Signed)
OT Cancellation Note  Patient Details Name: MESCAL FLINCHBAUGH MRN: [id number] DOB: Feb 09, 1951   Cancelled Treatment:    Reason Eval/Treat Not Completed: Patient declined, no reason specified. Pt with complaints of nausea and asking to "hold off" from OT evaluation at this time. Will check back as schedule allows, may be tomorrow. Thank you for the order.   CLAY,PATRICIA , MS, OTR/L, CLT Pager: [phone]   05/15/2015, 8:44 AM

## 2015-05-15 NOTE — Progress Notes (Signed)
Subjective: 1 Day Post-Op Procedure(s) (LRB): LEFT TOTAL KNEE ARTHROPLASTY (Left) Patient reports pain as 3 on 0-10 scale.    Objective: Vital signs in last 24 hours: Temp:  [97.2 F (36.2 C)-98.4 F (36.9 C)] 97.9 F (36.6 C) (09/24 0649) Pulse Rate:  [58-94] 58 (09/24 0649) Resp:  [12-17] 16 (09/24 0649) BP: (123-146)/(55-73) 128/55 mmHg (09/24 0649) SpO2:  [98 %-100 %] 100 % (09/24 0649) Weight:  [85.73 kg (189 lb)] 85.73 kg (189 lb) (09/23 1222)  Intake/Output from previous day: 09/23 0701 - 09/24 0700 In: 6782.5 [P.O.:1180; I.V.:5437.5; IV Piggyback:165] Out: 2300 [Urine:2050; Blood:250] Intake/Output this shift:     Recent Labs  05/15/15 0457  HGB 11.0*    Recent Labs  05/15/15 0457  WBC 12.4*  RBC 4.15  HCT 35.0*  PLT 179    Recent Labs  05/15/15 0457  NA 137  K 3.4*  CL 103  CO2 27  BUN 7  CREATININE <0.30*  GLUCOSE 126*  CALCIUM 8.1*   No results for input(s): LABPT, INR in the last 72 hours.  Neurologically intact Neurovascular intact Intact pulses distally Compartment soft  Assessment/Plan: 1 Day Post-Op Procedure(s) (LRB): LEFT TOTAL KNEE ARTHROPLASTY (Left) Advance diet Up with therapy D/C IV fluids Plan for discharge tomorrow  Tracy Bonilla C 05/15/2015, 8:16 AM

## 2015-05-15 NOTE — Care Management Note (Signed)
Case Management Note  Patient Details  Name: Tracy Bonilla MRN: [id number] Date of Birth: Dec 14, 1950  Subjective/Objective:                  LEFT TOTAL KNEE ARTHROPLASTY   Action/Plan: Discharge planning  Expected Discharge Date:    05/17/15              Expected Discharge Plan:  Home w Home Health Services  In-House Referral:     Discharge planning Services  CM Consult  Post Acute Care Choice:  Home Health Choice offered to:  Patient  DME Arranged:  3-N-1, Walker rolling DME Agency:  [facility]  HH Arranged:  PT HH Agency:  [facility]  Status of Service:  Completed, signed off  Medicare Important Message Given:    Date Medicare IM Given:    Medicare IM give by:    Date Additional Medicare IM Given:    Additional Medicare Important Message give by:     If discussed at Long Length of Stay Meetings, dates discussed:    Additional Comments: CM spoke with patient at the bedside. [facility] been set-up pre-operatively for HHPT. Patient agrees. Needs a 3N1 and [facility] at [facility] notified of the DME request.  [name], RN 05/15/2015, 11:48 AM

## 2015-05-15 NOTE — Progress Notes (Signed)
Physical Therapy Treatment Patient Details Name: Tracy Bonilla MRN: [id number] DOB: December 11, 1950 Today's Date: 05/15/2015    History of Present Illness L TKR     PT Comments    Pt motivated but progressing slowly 2* nausea.  Follow Up Recommendations  Home health PT     Equipment Recommendations  Rolling walker with 5" wheels    Recommendations for Other Services OT consult     Precautions / Restrictions Precautions Precautions: Knee;Fall Required Braces or Orthoses: Knee Immobilizer - Left Knee Immobilizer - Left: Discontinue once straight leg raise with < 10 degree lag Restrictions Weight Bearing Restrictions: No Other Position/Activity Restrictions: WBAT    Mobility  Bed Mobility Overal bed mobility: Needs Assistance Bed Mobility: Supine to Sit     Supine to sit: Mod assist     General bed mobility comments: cues for sequence and use of R LE to self assist.  Physical assist to manage L LE and to control trunk  Transfers Overall transfer level: Needs assistance Equipment used: Rolling walker (2 wheeled) Transfers: Sit to/from Stand Sit to Stand: Min assist;Mod assist;+2 physical assistance;+2 safety/equipment         General transfer comment: cues for LE management and use of UEs to self assist  Ambulation/Gait Ambulation/Gait assistance: Min assist;Mod assist;+2 safety/equipment Ambulation Distance (Feet): 10 Feet Assistive device: Rolling walker (2 wheeled) Gait Pattern/deviations: Step-to pattern;Decreased step length - right;Decreased step length - left;Shuffle;Trunk flexed Gait velocity: decr   General Gait Details: cues for sequence, posture and position from [employer]            Wheelchair Mobility    Modified Rankin (Stroke Patients Only)       Balance                                    Cognition Arousal/Alertness: Awake/alert Behavior During Therapy: WFL for tasks assessed/performed Overall Cognitive Status:  Within Functional Limits for tasks assessed                      Exercises Total Joint Exercises Ankle Circles/Pumps: AROM;Both;15 reps;Supine Quad Sets: AROM;Both;10 reps;Supine Heel Slides: AAROM;Left;10 reps;Supine Hip ABduction/ADduction: AAROM;Left;10 reps;Supine    General Comments        Pertinent Vitals/Pain Pain Assessment: 0-10 Pain Score: 5  Pain Location: L knee Pain Descriptors / Indicators: Aching;Sore Pain Intervention(s): Limited activity within patient's tolerance;Monitored during session;Premedicated before session;Ice applied    Home Living                      Prior Function            PT Goals (current goals can now be found in the care plan section) Acute Rehab PT Goals Patient Stated Goal: Resume previous lifestyle with decreased pain PT Goal Formulation: With patient Time For Goal Achievement: 05/21/15 Potential to Achieve Goals: Good Progress towards PT goals: Progressing toward goals    Frequency  7X/week    PT Plan Current plan remains appropriate    Co-evaluation             End of Session Equipment Utilized During Treatment: Gait belt;Left knee immobilizer Activity Tolerance: Other (comment) (nausea) Patient left: in bed;with call bell/phone within reach;with family/visitor present     Time: 3086-5784 PT Time Calculation (min) (ACUTE ONLY): 35 min  Charges:  $Gait Training: 8-22 mins $Therapeutic Exercise: 8-22  mins                    G Codes:      BRADSHAW,HUNTER 05/30/15, 12:32 PM

## 2015-05-15 NOTE — Progress Notes (Signed)
Physical Therapy Treatment Patient Details Name: Tracy Bonilla MRN: [id number] DOB: 1951/04/11 Today's Date: 05/15/2015    History of Present Illness L TKR     PT Comments    Marked improvement with activity tolerance with decreased nausea.    Follow Up Recommendations  Home health PT     Equipment Recommendations  Rolling walker with 5" wheels    Recommendations for Other Services OT consult     Precautions / Restrictions Precautions Precautions: Knee;Fall Required Braces or Orthoses: Knee Immobilizer - Left Knee Immobilizer - Left: Discontinue once straight leg raise with < 10 degree lag Restrictions Weight Bearing Restrictions: No Other Position/Activity Restrictions: WBAT    Mobility  Bed Mobility Overal bed mobility: Needs Assistance Bed Mobility: Supine to Sit     Supine to sit: Min assist;Mod assist     General bed mobility comments: cues for sequence and use of R LE to self assist.  Physical assist to manage L LE and to control trunk  Transfers Overall transfer level: Needs assistance Equipment used: Rolling walker (2 wheeled) Transfers: Sit to/from Stand Sit to Stand: Min assist;Mod assist         General transfer comment: cues for LE management and use of UEs to self assist  Ambulation/Gait Ambulation/Gait assistance: Min assist;Mod assist Ambulation Distance (Feet): 58 Feet Assistive device: Rolling walker (2 wheeled) Gait Pattern/deviations: Step-to pattern;Decreased step length - right;Decreased step length - left;Shuffle;Trunk flexed Gait velocity: decr   General Gait Details: cues for sequence, posture and position from [employer]            Wheelchair Mobility    Modified Rankin (Stroke Patients Only)       Balance                                    Cognition Arousal/Alertness: Awake/alert Behavior During Therapy: WFL for tasks assessed/performed Overall Cognitive Status: Within Functional Limits for  tasks assessed                      Exercises Total Joint Exercises Ankle Circles/Pumps: AROM;Both;15 reps;Supine Quad Sets: AROM;Both;10 reps;Supine Heel Slides: AAROM;Left;10 reps;Supine Hip ABduction/ADduction: AAROM;Left;10 reps;Supine    General Comments        Pertinent Vitals/Pain Pain Assessment: 0-10 Pain Score: 5  Pain Location: L knee Pain Descriptors / Indicators: Aching;Sore Pain Intervention(s): Limited activity within patient's tolerance;Monitored during session;Patient requesting pain meds-RN notified;Ice applied    Home Living                      Prior Function            PT Goals (current goals can now be found in the care plan section) Acute Rehab PT Goals Patient Stated Goal: Resume previous lifestyle with decreased pain PT Goal Formulation: With patient Time For Goal Achievement: 05/21/15 Potential to Achieve Goals: Good Progress towards PT goals: Progressing toward goals    Frequency  7X/week    PT Plan Current plan remains appropriate    Co-evaluation             End of Session Equipment Utilized During Treatment: Gait belt;Left knee immobilizer Activity Tolerance: Patient tolerated treatment well Patient left: with call bell/phone within reach;in chair     Time: 6578-4696 PT Time Calculation (min) (ACUTE ONLY): 40 min  Charges:  $Gait Training: 23-37 mins $Therapeutic Exercise:  8-22 mins                    G Codes:      BRADSHAW,HUNTER 05-25-2015, 3:58 PM

## 2015-05-16 LAB — CBC
HCT: 37.1 % (ref 36.0–46.0)
Hemoglobin: 11.8 g/dL — ABNORMAL LOW (ref 12.0–15.0)
MCH: 26.8 pg (ref 26.0–34.0)
MCHC: 31.8 g/dL (ref 30.0–36.0)
MCV: 84.3 fL (ref 78.0–100.0)
PLATELETS: 189 10*3/uL (ref 150–400)
RBC: 4.4 MIL/uL (ref 3.87–5.11)
RDW: 13.5 % (ref 11.5–15.5)
WBC: 10.4 10*3/uL (ref 4.0–10.5)

## 2015-05-16 LAB — GLUCOSE, CAPILLARY: GLUCOSE-CAPILLARY: 146 mg/dL — AB (ref 65–99)

## 2015-05-16 NOTE — Progress Notes (Signed)
Physical Therapy Treatment Patient Details Name: Tracy Bonilla MRN: [id number] DOB: Oct 03, 1950 Today's Date: 06-07-2015    History of Present Illness L TKR     PT Comments      Follow Up Recommendations  Home health PT     Equipment Recommendations  Rolling walker with 5" wheels    Recommendations for Other Services OT consult     Precautions / Restrictions Precautions Precautions: Knee;Fall Required Braces or Orthoses: Knee Immobilizer - Left Knee Immobilizer - Left: Discontinue once straight leg raise with < 10 degree lag Restrictions Weight Bearing Restrictions: No Other Position/Activity Restrictions: WBAT    Mobility  Bed Mobility Overal bed mobility: Needs Assistance Bed Mobility: Supine to Sit     Supine to sit: Min guard     General bed mobility comments: cues for sequence and use of R LE to self assist.   Transfers Overall transfer level: Needs assistance Equipment used: Rolling walker (2 wheeled) Transfers: Sit to/from Stand Sit to Stand: Min guard         General transfer comment: cues for LE management and use of UEs to self assist  Ambulation/Gait Ambulation/Gait assistance: Min guard Ambulation Distance (Feet): 17 Feet Assistive device: Rolling walker (2 wheeled) Gait Pattern/deviations: Step-to pattern;Decreased step length - right;Decreased step length - left;Shuffle;Trunk flexed Gait velocity: decr   General Gait Details: cues for sequence, posture and position from [employer]            Wheelchair Mobility    Modified Rankin (Stroke Patients Only)       Balance                                    Cognition Arousal/Alertness: Awake/alert Behavior During Therapy: WFL for tasks assessed/performed Overall Cognitive Status: Within Functional Limits for tasks assessed                      Exercises Total Joint Exercises Straight Leg Raises: AAROM;Left;10 reps;Supine    General Comments         Pertinent Vitals/Pain Pain Assessment: 0-10 Pain Score: 3  Pain Location: L knee Pain Descriptors / Indicators: Aching;Sore Pain Intervention(s): Limited activity within patient's tolerance;Monitored during session;Premedicated before session    Home Living                      Prior Function            PT Goals (current goals can now be found in the care plan section) Acute Rehab PT Goals Patient Stated Goal: Resume previous lifestyle with decreased pain PT Goal Formulation: With patient Time For Goal Achievement: 05/21/15 Potential to Achieve Goals: Good Progress towards PT goals: Progressing toward goals    Frequency  7X/week    PT Plan Current plan remains appropriate    Co-evaluation             End of Session Equipment Utilized During Treatment: Gait belt;Left knee immobilizer Activity Tolerance: Patient tolerated treatment well Patient left: Other (comment) (in bathroom with OT)     Time: 4098-1191 PT Time Calculation (min) (ACUTE ONLY): 14 min  Charges:  $Gait Training: 8-22 mins                    G Codes:      BRADSHAW,HUNTER Jun 07, 2015, 8:40 AM

## 2015-05-16 NOTE — Progress Notes (Signed)
Physical Therapy Treatment Patient Details Name: Tracy Bonilla MRN: [id number] DOB: Dec 24, 1950 Today's Date: 05/16/2015    History of Present Illness L TKR     PT Comments    Marked improvement in activity tolerance this date with pt progressing well with mobility.  Reviewed therex, stairs and don/doff KI with pt and spouse.  Follow Up Recommendations  Home health PT     Equipment Recommendations  Rolling walker with 5" wheels    Recommendations for Other Services OT consult     Precautions / Restrictions Precautions Precautions: Knee;Fall Required Braces or Orthoses: Knee Immobilizer - Left Knee Immobilizer - Left: Discontinue once straight leg raise with < 10 degree lag Restrictions Weight Bearing Restrictions: No Other Position/Activity Restrictions: WBAT    Mobility  Bed Mobility               General bed mobility comments: Pt declines back to bed  Transfers Overall transfer level: Needs assistance Equipment used: Rolling walker (2 wheeled) Transfers: Sit to/from Stand Sit to Stand: Min guard         General transfer comment: cues for LE management and use of UEs to self assist  Ambulation/Gait Ambulation/Gait assistance: Min guard Ambulation Distance (Feet): 140 Feet Assistive device: Rolling walker (2 wheeled) Gait Pattern/deviations: Step-to pattern;Decreased step length - right;Decreased step length - left;Shuffle;Trunk flexed Gait velocity: decr   General Gait Details: cues for sequence, posture, stride length and position from RW   Stairs Stairs: Yes Stairs assistance: Min assist Stair Management: Two rails;Forwards;Step to pattern Number of Stairs: 5 General stair comments: min cues for foot placement  Wheelchair Mobility    Modified Rankin (Stroke Patients Only)       Balance                                    Cognition Arousal/Alertness: Awake/alert Behavior During Therapy: WFL for tasks  assessed/performed Overall Cognitive Status: Within Functional Limits for tasks assessed                      Exercises Total Joint Exercises Ankle Circles/Pumps: AROM;Both;15 reps;Supine Quad Sets: AROM;Both;Supine;20 reps Heel Slides: AAROM;Left;Supine;15 reps Straight Leg Raises: AAROM;Left;Supine;20 reps    General Comments        Pertinent Vitals/Pain Pain Assessment: 0-10 Pain Score: 4  Pain Location: L knee Pain Descriptors / Indicators: Aching;Sore Pain Intervention(s): Limited activity within patient's tolerance;Monitored during session;Premedicated before session;Ice applied    Home Living                      Prior Function            PT Goals (current goals can now be found in the care plan section) Acute Rehab PT Goals Patient Stated Goal: Resume previous lifestyle with decreased pain PT Goal Formulation: With patient Time For Goal Achievement: 05/21/15 Potential to Achieve Goals: Good Progress towards PT goals: Progressing toward goals    Frequency  7X/week    PT Plan Current plan remains appropriate    Co-evaluation             End of Session Equipment Utilized During Treatment: Gait belt;Left knee immobilizer Activity Tolerance: Patient tolerated treatment well Patient left: in chair;with call bell/phone within reach;with family/visitor present     Time: 0925-1003 PT Time Calculation (min) (ACUTE ONLY): 38 min  Charges:  $Gait Training: 8-22 mins $  Therapeutic Exercise: 8-22 mins $Therapeutic Activity: 8-22 mins                    G Codes:      Rishik Tubby 05/21/15, 12:51 PM

## 2015-05-16 NOTE — Progress Notes (Signed)
Occupational Therapy Evaluation Patient Details Name: Tracy Bonilla MRN: [id number] DOB: Dec 18, 1950 Today's Date: 05/16/2015    History of Present Illness L TKR    Clinical Impression   Patient presents to OT with decreased ADL independence s/p L TKA. Will benefit from skilled OT to maximize independence with ADLs and to facilitate a safe discharge plan.    Follow Up Recommendations  No OT follow up;Supervision - Intermittent    Equipment Recommendations  3 in 1 bedside comode    Recommendations for Other Services PT consult     Precautions / Restrictions Precautions Precautions: Knee;Fall Required Braces or Orthoses: Knee Immobilizer - Left Knee Immobilizer - Left: Discontinue once straight leg raise with < 10 degree lag Restrictions Weight Bearing Restrictions: No Other Position/Activity Restrictions: WBAT      Mobility         Transfers Overall transfer level: Needs assistance Equipment used: Rolling walker (2 wheeled) Transfers: Sit to/from Stand Sit to Stand: Min guard         General transfer comment: cues for LE management and use of UEs to self assist    Balance                                            ADL Overall ADL's : Needs assistance/impaired Eating/Feeding: Independent;Sitting   Grooming: Wash/dry hands;Wash/dry face;Set up;Supervision/safety;Standing   Upper Body Bathing: Set up;Sitting   Lower Body Bathing: Minimal assistance;Sit to/from stand       Lower Body Dressing: Moderate assistance;Sit to/from stand   Toilet Transfer: Min guard;Ambulation;BSC;RW   Toileting- Architect and Hygiene: Min guard;Sit to/from stand   Tub/ Shower Transfer: Walk-in shower;Minimal assistance;3 in [employer] walker   Functional mobility during ADLs: Min guard;Rolling walker General ADL Comments: Patient educated in LB ADL techniques and reports her husband will be assisting her with LB ADLs at home. Walk-in shower  transfer completed and patient had difficulty getting LLE over threshold; discussed with patient waiting a few days until she was moving better to attempt a shower and sponge bathing initially. She was in agreement and will have her husband assist her with sponge bathing for now. Patient performed toileting and grooming tasks min guard A with RW.      Vision     Perception     Praxis      Pertinent Vitals/Pain Pain Assessment: 0-10 Pain Score: 3  Pain Location: L knee Pain Descriptors / Indicators: Aching;Sore Pain Intervention(s): Limited activity within patient's tolerance;Monitored during session;Premedicated before session     Hand Dominance Right   Extremity/Trunk Assessment Upper Extremity Assessment Upper Extremity Assessment: Overall WFL for tasks assessed   Lower Extremity Assessment Lower Extremity Assessment: Defer to PT evaluation   Cervical / Trunk Assessment Cervical / Trunk Assessment: Normal   Communication Communication Communication: No difficulties   Cognition Arousal/Alertness: Awake/alert Behavior During Therapy: WFL for tasks assessed/performed Overall Cognitive Status: Within Functional Limits for tasks assessed                     General Comments       Shoulder Instructions      Home Living Family/patient expects to be discharged to:: Private residence Living Arrangements: Spouse/significant other Available Help at Discharge: Family;Available 24 hours/day Type of Home: House Home Access: Stairs to enter [employer] of Steps: 2 Entrance Stairs-Rails: Right;Left;Can reach both Home  Layout: Able to live on main level with bedroom/bathroom     Bathroom Shower/Tub: Producer, television/film/video: Handicapped height Bathroom Accessibility: Yes How Accessible: Accessible via walker Home Equipment: Cane - single point          Prior Functioning/Environment Level of Independence: Independent;Independent with  assistive device(s)             OT Diagnosis: Acute pain;Generalized weakness   OT Problem List: Decreased strength;Decreased range of motion;Decreased activity tolerance;Decreased knowledge of use of DME or AE;Pain   OT Treatment/Interventions: Self-care/ADL training;DME and/or AE instruction;Therapeutic activities;Patient/family education    OT Goals(Current goals can be found in the care plan section) Acute Rehab OT Goals Patient Stated Goal: Resume previous lifestyle with decreased pain OT Goal Formulation: With patient Time For Goal Achievement: 05/23/15 Potential to Achieve Goals: Good ADL Goals Pt Will Perform Lower Body Bathing: with supervision;sit to/from stand Pt Will Perform Lower Body Dressing: with min assist;sit to/from stand Pt Will Transfer to Toilet: with supervision;ambulating;bedside commode Pt Will Perform Toileting - Clothing Manipulation and hygiene: with supervision;sit to/from stand Pt Will Perform Tub/Shower Transfer: with supervision;ambulating;3 in 1;rolling walker  OT Frequency: Min 2X/week   Barriers to D/C:            Co-evaluation              End of Session Equipment Utilized During Treatment: Rolling walker;Left knee immobilizer Nurse Communication: Mobility status  Activity Tolerance: Patient tolerated treatment well Patient left: in chair;with call bell/phone within reach   Time: 0831-0849 OT Time Calculation (min): 18 min Charges:  OT General Charges $OT Visit: 1 Procedure OT Evaluation $Initial OT Evaluation Tier I: 1 Procedure G-Codes:    Early, Leila A 05/20/15, 9:27 AM

## 2015-05-16 NOTE — Progress Notes (Signed)
Discharged from floor via w/c, spouse & belongings with pt. No changes in assessment. Council, Taylor  

## 2015-05-16 NOTE — Progress Notes (Signed)
   Subjective: 2 Days Post-Op Procedure(s) (LRB): LEFT TOTAL KNEE ARTHROPLASTY (Left) Patient reports pain as mild.   Patient seen in rounds for Dr. Linna Caprice Patient is well, and has had no acute complaints or problems. No SOB or chest pain. No issues overnight.   Objective: Vital signs in last 24 hours: Temp:  [97.3 F (36.3 C)-98.6 F (37 C)] 98.1 F (36.7 C) (09/25 0433) Pulse Rate:  [58-79] 71 (09/25 0433) Resp:  [16] 16 (09/25 0433) BP: (129-141)/(58-73) 141/58 mmHg (09/25 0433) SpO2:  [94 %-98 %] 96 % (09/25 0433)  Intake/Output from previous day:  Intake/Output Summary (Last 24 hours) at 05/16/15 0910 Last data filed at 05/16/15 0835  Gross per 24 hour  Intake 1031.67 ml  Output   2550 ml  Net -1518.33 ml    Intake/Output this shift: Total I/O In: 240 [P.O.:240] Out: -   Labs:  Recent Labs  05/15/15 0457 05/16/15 0502  HGB 11.0* 11.8*    Recent Labs  05/15/15 0457 05/16/15 0502  WBC 12.4* 10.4  RBC 4.15 4.40  HCT 35.0* 37.1  PLT 179 189    Recent Labs  05/15/15 0457  NA 137  K 3.4*  CL 103  CO2 27  BUN 7  CREATININE <0.30*  GLUCOSE 126*  CALCIUM 8.1*    EXAM General - Patient is Alert and Oriented Extremity - Neurologically intact Intact pulses distally Dorsiflexion/Plantar flexion intact Compartment soft Dressing/Incision - clean, dry, no drainage Motor Function - intact, moving foot and toes well on exam.   Past Medical History  Diagnosis Date  . Hypertension   . Hyperlipidemia   . Diabetes mellitus without complication   . Complication of anesthesia   . PONV (postoperative nausea and vomiting)   . Arthritis     Assessment/Plan: 2 Days Post-Op Procedure(s) (LRB): LEFT TOTAL KNEE ARTHROPLASTY (Left) Principal Problem:   Primary osteoarthritis of left knee  Estimated body mass index is 32.43 kg/(m^2) as calculated from the following:   Height as of this encounter:  (1.626 m).   Weight as of this encounter: 85.73  kg (189 lb). Advance diet Up with therapy D/C IV fluids Discharge home with home health  DVT Prophylaxis - Eliquis Weight-Bearing as tolerated   Doing well. Continue PT today. Plan for DC home today with HHPT.   Dimitri Ped, PA-C Orthopaedic Surgery 05/16/2015, 9:10 AM

## 2015-05-16 NOTE — Plan of Care (Signed)
Problem: Discharge Progression Outcomes Goal: Anticoagulant follow-up in place Outcome: Not Applicable Date Met:  76/16/07 eliquis

## 2016-08-29 DIAGNOSIS — M1711 Unilateral primary osteoarthritis, right knee: Secondary | ICD-10-CM | POA: Diagnosis not present

## 2016-09-12 DIAGNOSIS — M1991 Primary osteoarthritis, unspecified site: Secondary | ICD-10-CM | POA: Diagnosis not present

## 2016-09-12 DIAGNOSIS — Z1389 Encounter for screening for other disorder: Secondary | ICD-10-CM | POA: Diagnosis not present

## 2016-09-12 DIAGNOSIS — I10 Essential (primary) hypertension: Secondary | ICD-10-CM | POA: Diagnosis not present

## 2016-09-12 DIAGNOSIS — J329 Chronic sinusitis, unspecified: Secondary | ICD-10-CM | POA: Diagnosis not present

## 2016-09-12 DIAGNOSIS — E119 Type 2 diabetes mellitus without complications: Secondary | ICD-10-CM | POA: Diagnosis not present

## 2016-09-12 DIAGNOSIS — E6609 Other obesity due to excess calories: Secondary | ICD-10-CM | POA: Diagnosis not present

## 2016-09-12 DIAGNOSIS — E669 Obesity, unspecified: Secondary | ICD-10-CM | POA: Diagnosis not present

## 2016-09-12 DIAGNOSIS — Z6831 Body mass index (BMI) 31.0-31.9, adult: Secondary | ICD-10-CM | POA: Diagnosis not present

## 2016-09-14 DIAGNOSIS — E1165 Type 2 diabetes mellitus with hyperglycemia: Secondary | ICD-10-CM | POA: Diagnosis not present

## 2016-09-21 DIAGNOSIS — Z23 Encounter for immunization: Secondary | ICD-10-CM | POA: Diagnosis not present

## 2016-09-27 DIAGNOSIS — E119 Type 2 diabetes mellitus without complications: Secondary | ICD-10-CM | POA: Diagnosis not present

## 2016-09-27 DIAGNOSIS — H35371 Puckering of macula, right eye: Secondary | ICD-10-CM | POA: Diagnosis not present

## 2016-09-27 DIAGNOSIS — E1165 Type 2 diabetes mellitus with hyperglycemia: Secondary | ICD-10-CM | POA: Diagnosis not present

## 2016-12-12 DIAGNOSIS — E119 Type 2 diabetes mellitus without complications: Secondary | ICD-10-CM | POA: Diagnosis not present

## 2016-12-12 DIAGNOSIS — Z1389 Encounter for screening for other disorder: Secondary | ICD-10-CM | POA: Diagnosis not present

## 2016-12-12 DIAGNOSIS — Z6832 Body mass index (BMI) 32.0-32.9, adult: Secondary | ICD-10-CM | POA: Diagnosis not present

## 2016-12-12 DIAGNOSIS — I10 Essential (primary) hypertension: Secondary | ICD-10-CM | POA: Diagnosis not present

## 2016-12-12 DIAGNOSIS — E782 Mixed hyperlipidemia: Secondary | ICD-10-CM | POA: Diagnosis not present

## 2016-12-12 DIAGNOSIS — J019 Acute sinusitis, unspecified: Secondary | ICD-10-CM | POA: Diagnosis not present

## 2017-01-17 DIAGNOSIS — E6609 Other obesity due to excess calories: Secondary | ICD-10-CM | POA: Diagnosis not present

## 2017-01-17 DIAGNOSIS — N342 Other urethritis: Secondary | ICD-10-CM | POA: Diagnosis not present

## 2017-01-17 DIAGNOSIS — R350 Frequency of micturition: Secondary | ICD-10-CM | POA: Diagnosis not present

## 2017-01-17 DIAGNOSIS — Z6831 Body mass index (BMI) 31.0-31.9, adult: Secondary | ICD-10-CM | POA: Diagnosis not present

## 2017-02-13 DIAGNOSIS — E782 Mixed hyperlipidemia: Secondary | ICD-10-CM | POA: Diagnosis not present

## 2017-02-13 DIAGNOSIS — N342 Other urethritis: Secondary | ICD-10-CM | POA: Diagnosis not present

## 2017-02-13 DIAGNOSIS — M1991 Primary osteoarthritis, unspecified site: Secondary | ICD-10-CM | POA: Diagnosis not present

## 2017-02-13 DIAGNOSIS — R39198 Other difficulties with micturition: Secondary | ICD-10-CM | POA: Diagnosis not present

## 2017-02-13 DIAGNOSIS — E6609 Other obesity due to excess calories: Secondary | ICD-10-CM | POA: Diagnosis not present

## 2017-02-13 DIAGNOSIS — Z6830 Body mass index (BMI) 30.0-30.9, adult: Secondary | ICD-10-CM | POA: Diagnosis not present

## 2017-02-13 DIAGNOSIS — I10 Essential (primary) hypertension: Secondary | ICD-10-CM | POA: Diagnosis not present

## 2017-02-13 DIAGNOSIS — E119 Type 2 diabetes mellitus without complications: Secondary | ICD-10-CM | POA: Diagnosis not present

## 2017-02-19 ENCOUNTER — Telehealth: Payer: Self-pay | Admitting: Certified Nurse Midwife

## 2017-02-19 DIAGNOSIS — Z780 Asymptomatic menopausal state: Secondary | ICD-10-CM

## 2017-02-19 NOTE — Telephone Encounter (Signed)
Leota Sauerseborah Leonard, CNM -ok to send order for BMD?

## 2017-02-19 NOTE — Telephone Encounter (Signed)
Order placed for BMD.  Call to patient, advised as seen below per Leota Sauerseborah Leonard, CNM. Advised patient to call [facility] directly for scheduling. Patient verbalizes understanding and is agreeable.  Routing to provider for final review. Patient is agreeable to disposition. Will close encounter.

## 2017-02-19 NOTE — Telephone Encounter (Signed)
Due to age I think she should schedule. Ok to [place]

## 2017-02-19 NOTE — Telephone Encounter (Signed)
Patient has questions for the nurse about when to schedule her first bone density test. She is due for her mammogram and wants to have them both at the [facility].  Patient previously saw Leota SauersDeborah Leonard, CNM (03/03/14) but is next scheduled to see Dr. Edward JollySilva for her AEX per patient request/schedule.

## 2017-02-22 ENCOUNTER — Other Ambulatory Visit: Payer: Self-pay | Admitting: Certified Nurse Midwife

## 2017-02-22 DIAGNOSIS — Z1231 Encounter for screening mammogram for malignant neoplasm of breast: Secondary | ICD-10-CM

## 2017-02-26 ENCOUNTER — Other Ambulatory Visit (HOSPITAL_COMMUNITY)
Admission: RE | Admit: 2017-02-26 | Discharge: 2017-02-26 | Disposition: A | Discharge status: Home / Self Care | Payer: Medicare Other | Source: Ambulatory Visit | Attending: Obstetrics and Gynecology | Admitting: Obstetrics and Gynecology

## 2017-02-26 ENCOUNTER — Ambulatory Visit (INDEPENDENT_AMBULATORY_CARE_PROVIDER_SITE_OTHER): Payer: Medicare Other | Admitting: Obstetrics and Gynecology

## 2017-02-26 ENCOUNTER — Encounter: Payer: Self-pay | Admitting: Obstetrics and Gynecology

## 2017-02-26 VITALS — BP 142/70 | HR 80 | Resp 16 | Ht 64.0 in | Wt 184.0 lb

## 2017-02-26 DIAGNOSIS — Z01419 Encounter for gynecological examination (general) (routine) without abnormal findings: Secondary | ICD-10-CM | POA: Diagnosis not present

## 2017-02-26 DIAGNOSIS — Z124 Encounter for screening for malignant neoplasm of cervix: Secondary | ICD-10-CM

## 2017-02-26 DIAGNOSIS — Z23 Encounter for immunization: Secondary | ICD-10-CM | POA: Diagnosis not present

## 2017-02-26 NOTE — Patient Instructions (Signed)

## 2017-02-26 NOTE — Progress Notes (Signed)
66 y.o. [id number] Married Caucasian female here for annual exam.    History of a couple of UTIs in May and June of this year.   On Invokana and Metformin for her DM.  Will check her A1C end of this month.  Gave up sweet tea.   Referred by a friend.  Former patient of Dr. Elana AlmMcPhail.  PCP:  Dr. Sherwood GamblerFusco.   Patient's last menstrual period was 08/21/1988.       Sexually active: Yes.    The current method of family planning is post menopausal status.    Exercising: Yes.    housework, yardwork Smoker:  no  Health Maintenance: Pap:  03/03/14 Pap and HR HPV negative History of abnormal Pap:  no MMG:  02/04/15 BIRADS 1 negative/density b -- scheduled w/ [facility] 03/01/17 Colonoscopy:  Never.  States she is not ready for this.  BMD:   Scheduled 03/01/17 TDaP:  2008 Gardasil:   N/A Hep C: January 2018 w/ PCP Screening Labs:  Labs done with PCP   reports that she has never smoked. She has never used smokeless tobacco. She reports that she does not drink alcohol or use drugs.  Past Medical History:  Diagnosis Date  . Arthritis   . Complication of anesthesia   . Diabetes mellitus without complication (HCC)   . Hyperlipidemia   . Hypertension   . PONV (postoperative nausea and vomiting)     Past Surgical History:  Procedure Laterality Date  . arthroscopic surgery Left   . BREAST SURGERY Left    1998  . cataract surgery    . DILATION AND CURETTAGE OF UTERUS    . extra digit removed     thumb  . FOOT SURGERY Right   . REFRACTIVE SURGERY     both eyes  . TOTAL KNEE ARTHROPLASTY Left 05/14/2015   Procedure: LEFT TOTAL KNEE ARTHROPLASTY;  Surgeon: Samson FredericBrian Swinteck, MD;  Location: [facility];  Service: Orthopedics;  Laterality: Left;  . TUBAL LIGATION      Current Outpatient Prescriptions  Medication Sig Dispense Refill  . atorvastatin (LIPITOR) 40 MG tablet Take 40 mg by mouth daily at 6 PM.    . calcium-vitamin D (OSCAL 500/200 D-3) 500-200 MG-UNIT per tablet Take 1 tablet by mouth daily.     . Canagliflozin (INVOKANA) 100 MG TABS Take 100 mg by mouth daily.    . hydrochlorothiazide (HYDRODIURIL) 25 MG tablet Take 25 mg by mouth daily. With lunch    . losartan (COZAAR) 50 MG tablet Take 50 mg by mouth 2 (two) times daily.     . metFORMIN (GLUCOPHAGE) 500 MG tablet Take 500 mg by mouth 2 (two) times daily with a meal.    . nabumetone (RELAFEN) 500 MG tablet Take 1 tablet by mouth 2 (two) times daily.    . potassium chloride (K-DUR,KLOR-CON) 10 MEQ tablet Take 10 mEq by mouth every morning.      No current facility-administered medications for this visit.     Family History  Problem Relation Age of Onset  . Cancer Mother        liver  . Hypertension Mother   . Heart disease Mother        CVD  . Cancer Father        lymphoma    ROS:  Pertinent items are noted in HPI.  Otherwise, a comprehensive ROS was negative.  Exam:   BP (!) 142/70 (BP Location: Right Arm, Patient Position: Sitting, Cuff Size: Normal)  Pulse 80   Resp 16   Ht 5\' 4"  (1.626 m)   Wt 184 lb (83.5 kg)   LMP 08/21/1988   BMI 31.58 kg/m     General appearance: alert, cooperative and appears stated age Head: Normocephalic, without obvious abnormality, atraumatic Neck: no adenopathy, supple, symmetrical, trachea midline and thyroid normal to inspection and palpation Lungs: clear to auscultation bilaterally Breasts: normal appearance, no masses or tenderness, No nipple retraction or dimpling, No nipple discharge or bleeding, No axillary or supraclavicular adenopathy Heart: regular rate and rhythm Abdomen: obesity, umbilical hernia. Abdomen is soft, non-tender; no masses, no organomegaly Extremities: extremities normal, atraumatic, no cyanosis or edema Skin: Skin color, texture, turgor normal. No rashes or lesions Lymph nodes: Cervical, supraclavicular, and axillary nodes normal. No abnormal inguinal nodes palpated Neurologic: Grossly normal  Pelvic: External genitalia:  no lesions               Urethra:  normal appearing urethra with no masses, tenderness or lesions              Bartholins and Skenes: normal                 Vagina: normal appearing vagina with normal color and discharge, no lesions              Cervix: no lesions              Pap taken: Yes.   Bimanual Exam:  Uterus:  normal size, contour, position, consistency, mobility, non-tender              Adnexa: no mass, fullness, tenderness              Rectal exam: Yes.  .  Confirms.              Anus:  normal sphincter tone, no lesions  Chaperone was present for exam.  Assessment:   Well woman visit with normal exam. Umbilical hernia.  Diabetes mellitus.  Plan: Mammogram and BMD.  Recommended self breast awareness. Pap and HR HPV as above. Guidelines for Calcium, Vitamin D, regular exercise program including cardiovascular and weight bearing exercise. IFOB through PCP.  TDap today.  I discussed signs of obstructed/incarcerated bowel.  I told her she may see a surgeon if she wishes.  Follow up annually and prn.    After visit summary provided.

## 2017-02-27 LAB — CYTOLOGY - PAP: Diagnosis: NEGATIVE

## 2017-03-01 ENCOUNTER — Ambulatory Visit (HOSPITAL_COMMUNITY)
Admission: RE | Admit: 2017-03-01 | Discharge: 2017-03-01 | Disposition: A | Discharge status: Home / Self Care | Payer: Medicare Other | Source: Ambulatory Visit | Attending: Certified Nurse Midwife | Admitting: Certified Nurse Midwife

## 2017-03-01 DIAGNOSIS — Z780 Asymptomatic menopausal state: Secondary | ICD-10-CM | POA: Insufficient documentation

## 2017-03-01 DIAGNOSIS — Z1231 Encounter for screening mammogram for malignant neoplasm of breast: Secondary | ICD-10-CM | POA: Insufficient documentation

## 2017-03-20 DIAGNOSIS — M1991 Primary osteoarthritis, unspecified site: Secondary | ICD-10-CM | POA: Diagnosis not present

## 2017-03-20 DIAGNOSIS — I10 Essential (primary) hypertension: Secondary | ICD-10-CM | POA: Diagnosis not present

## 2017-03-20 DIAGNOSIS — E6609 Other obesity due to excess calories: Secondary | ICD-10-CM | POA: Diagnosis not present

## 2017-03-20 DIAGNOSIS — Z6830 Body mass index (BMI) 30.0-30.9, adult: Secondary | ICD-10-CM | POA: Diagnosis not present

## 2017-03-20 DIAGNOSIS — E782 Mixed hyperlipidemia: Secondary | ICD-10-CM | POA: Diagnosis not present

## 2017-03-20 DIAGNOSIS — E119 Type 2 diabetes mellitus without complications: Secondary | ICD-10-CM | POA: Diagnosis not present

## 2017-05-01 DIAGNOSIS — M1711 Unilateral primary osteoarthritis, right knee: Secondary | ICD-10-CM | POA: Diagnosis not present

## 2017-05-01 DIAGNOSIS — Z96652 Presence of left artificial knee joint: Secondary | ICD-10-CM | POA: Diagnosis not present

## 2017-05-01 DIAGNOSIS — Z471 Aftercare following joint replacement surgery: Secondary | ICD-10-CM | POA: Diagnosis not present

## 2017-05-10 DIAGNOSIS — I10 Essential (primary) hypertension: Secondary | ICD-10-CM | POA: Diagnosis not present

## 2017-05-10 DIAGNOSIS — Z6830 Body mass index (BMI) 30.0-30.9, adult: Secondary | ICD-10-CM | POA: Diagnosis not present

## 2017-05-10 DIAGNOSIS — M1711 Unilateral primary osteoarthritis, right knee: Secondary | ICD-10-CM | POA: Diagnosis not present

## 2017-05-10 DIAGNOSIS — Z23 Encounter for immunization: Secondary | ICD-10-CM | POA: Diagnosis not present

## 2017-05-10 DIAGNOSIS — E782 Mixed hyperlipidemia: Secondary | ICD-10-CM | POA: Diagnosis not present

## 2017-05-15 ENCOUNTER — Ambulatory Visit: Payer: Self-pay | Admitting: Orthopedic Surgery

## 2017-05-16 ENCOUNTER — Ambulatory Visit: Payer: Self-pay | Admitting: Orthopedic Surgery

## 2017-05-16 NOTE — H&P (Signed)
TOTAL KNEE ADMISSION H&P  Patient is being admitted for right total knee arthroplasty.  Subjective:  Chief Complaint:right knee pain.  HPI: Tracy Bonilla, 66 y.o. female, has a history of pain and functional disability in the right knee due to arthritis and has failed non-surgical conservative treatments for greater than 12 weeks to includeNSAID's and/or analgesics, corticosteriod injections, flexibility and strengthening excercises, use of assistive devices, weight reduction as appropriate and activity modification.  Onset of symptoms was gradual, starting 5 years ago with gradually worsening course since that time. The patient noted no past surgery on the right knee(s).  Patient currently rates pain in the right knee(s) at 10 out of 10 with activity. Patient has night pain, worsening of pain with activity and weight bearing, pain that interferes with activities of daily living, pain with passive range of motion, crepitus and joint swelling.  Patient has evidence of subchondral cysts, subchondral sclerosis, periarticular osteophytes and joint space narrowing by imaging studies. There is no active infection.  Patient Active Problem List   Diagnosis Date Noted  . Primary osteoarthritis of left knee 05/14/2015  . Unspecified essential hypertension 03/03/2014    Class: History of  . Type 2 diabetes mellitus (HCC) 03/03/2014    Class: History of  . CONTRACTURE OF SHOULDER JOINT 12/03/2007  . SHOULDER PAIN 12/03/2007  . IMPINGEMENT SYNDROME 12/03/2007   Past Medical History:  Diagnosis Date  . Arthritis   . Complication of anesthesia   . Diabetes mellitus without complication (HCC)   . Hyperlipidemia   . Hypertension   . PONV (postoperative nausea and vomiting)     Past Surgical History:  Procedure Laterality Date  . arthroscopic surgery Left   . BREAST SURGERY Left    1998  . cataract surgery    . DILATION AND CURETTAGE OF UTERUS    . extra digit removed     thumb  . FOOT SURGERY  Right   . REFRACTIVE SURGERY     both eyes  . TOTAL KNEE ARTHROPLASTY Left 05/14/2015   Procedure: LEFT TOTAL KNEE ARTHROPLASTY;  Surgeon: Samson Frederic, MD;  Location: [facility];  Service: Orthopedics;  Laterality: Left;  . TUBAL LIGATION       (Not in a hospital admission) No Known Allergies  Social History  Substance Use Topics  . Smoking status: Never Smoker  . Smokeless tobacco: Never Used  . Alcohol use No    Family History  Problem Relation Age of Onset  . Cancer Mother        liver  . Hypertension Mother   . Heart disease Mother        CVD  . Cancer Father        lymphoma     Review of Systems  Constitutional: Negative.   HENT: Negative.   Eyes: Negative.   Respiratory: Negative.   Cardiovascular: Negative.   Gastrointestinal: Negative.   Genitourinary: Negative.   Musculoskeletal: Positive for joint pain.  Skin: Negative.   Neurological: Negative.   Endo/Heme/Allergies: Negative.   Psychiatric/Behavioral: Negative.     Objective:  Physical Exam  Vitals reviewed. Constitutional: She is oriented to person, place, and time. She appears well-developed and well-nourished.  HENT:  Head: Normocephalic and atraumatic.  Eyes: Pupils are equal, round, and reactive to light. Conjunctivae and EOM are normal.  Neck: Normal range of motion. Neck supple.  Cardiovascular: Normal rate and normal heart sounds.   Respiratory: Effort normal and breath sounds normal.  GI: Soft. Bowel sounds are  normal.  Genitourinary:  Genitourinary Comments: deferred  Musculoskeletal:       Right knee: She exhibits decreased range of motion, swelling, effusion, deformity and abnormal alignment. Tenderness found. Medial joint line and lateral joint line tenderness noted.  Neurological: She is alert and oriented to person, place, and time. She has normal reflexes.  Skin: Skin is warm and dry.  Psychiatric: She has a normal mood and affect. Her behavior is normal. Judgment and thought  content normal.    Vital signs in last 24 hours: @  Labs:   Estimated body mass index is 31.58 kg/m as calculated from the following:   Height as of 02/26/17:  (1.626 m).   Weight as of 02/26/17: 83.5 kg (184 lb).   Imaging Review Plain radiographs demonstrate severe degenerative joint disease of the right knee(s). The overall alignment issignificant varus. The bone quality appears to be adequate for age and reported activity level.  Assessment/Plan:  End stage arthritis, right knee   The patient history, physical examination, clinical judgment of the provider and imaging studies are consistent with end stage degenerative joint disease of the right knee(s) and total knee arthroplasty is deemed medically necessary. The treatment options including medical management, injection therapy arthroscopy and arthroplasty were discussed at length. The risks and benefits of total knee arthroplasty were presented and reviewed. The risks due to aseptic loosening, infection, stiffness, patella tracking problems, thromboembolic complications and other imponderables were discussed. The patient acknowledged the explanation, agreed to proceed with the plan and consent was signed. Patient is being admitted for inpatient treatment for surgery, pain control, PT, OT, prophylactic antibiotics, VTE prophylaxis, progressive ambulation and ADL's and discharge planning. The patient is planning to be discharged home with home health services

## 2017-05-17 ENCOUNTER — Encounter (HOSPITAL_COMMUNITY)
Admission: RE | Admit: 2017-05-17 | Discharge: 2017-05-17 | Disposition: A | Discharge status: Home / Self Care | Payer: Medicare Other | Source: Ambulatory Visit | Attending: Orthopedic Surgery | Admitting: Orthopedic Surgery

## 2017-05-17 ENCOUNTER — Encounter (HOSPITAL_COMMUNITY): Payer: Self-pay

## 2017-05-17 DIAGNOSIS — E785 Hyperlipidemia, unspecified: Secondary | ICD-10-CM | POA: Insufficient documentation

## 2017-05-17 DIAGNOSIS — Z01810 Encounter for preprocedural cardiovascular examination: Secondary | ICD-10-CM | POA: Insufficient documentation

## 2017-05-17 DIAGNOSIS — I10 Essential (primary) hypertension: Secondary | ICD-10-CM | POA: Diagnosis not present

## 2017-05-17 DIAGNOSIS — M1990 Unspecified osteoarthritis, unspecified site: Secondary | ICD-10-CM | POA: Insufficient documentation

## 2017-05-17 DIAGNOSIS — E119 Type 2 diabetes mellitus without complications: Secondary | ICD-10-CM | POA: Diagnosis not present

## 2017-05-17 DIAGNOSIS — Z01812 Encounter for preprocedural laboratory examination: Secondary | ICD-10-CM | POA: Insufficient documentation

## 2017-05-17 LAB — CBC
HCT: 43.1 % (ref 36.0–46.0)
HEMOGLOBIN: 13.2 g/dL (ref 12.0–15.0)
MCH: 24.6 pg — ABNORMAL LOW (ref 26.0–34.0)
MCHC: 30.6 g/dL (ref 30.0–36.0)
MCV: 80.3 fL (ref 78.0–100.0)
PLATELETS: 178 10*3/uL (ref 150–400)
RBC: 5.37 MIL/uL — ABNORMAL HIGH (ref 3.87–5.11)
RDW: 15.8 % — AB (ref 11.5–15.5)
WBC: 9.1 10*3/uL (ref 4.0–10.5)

## 2017-05-17 LAB — BASIC METABOLIC PANEL
Anion gap: 7 (ref 5–15)
BUN: 12 mg/dL (ref 6–20)
CALCIUM: 9.9 mg/dL (ref 8.9–10.3)
CO2: 28 mmol/L (ref 22–32)
CREATININE: 0.81 mg/dL (ref 0.44–1.00)
Chloride: 103 mmol/L (ref 101–111)
Glucose, Bld: 130 mg/dL — ABNORMAL HIGH (ref 65–99)
Potassium: 3.9 mmol/L (ref 3.5–5.1)
SODIUM: 138 mmol/L (ref 135–145)

## 2017-05-17 LAB — SURGICAL PCR SCREEN
MRSA, PCR: NEGATIVE
Staphylococcus aureus: NEGATIVE

## 2017-05-17 LAB — TYPE AND SCREEN
ABO/RH(D): A POS
Antibody Screen: NEGATIVE

## 2017-05-17 LAB — HEMOGLOBIN A1C
HEMOGLOBIN A1C: 7 % — AB (ref 4.8–5.6)
Mean Plasma Glucose: 154.2 mg/dL

## 2017-05-17 LAB — ABO/RH: ABO/RH(D): A POS

## 2017-05-17 LAB — GLUCOSE, CAPILLARY: GLUCOSE-CAPILLARY: 140 mg/dL — AB (ref 65–99)

## 2017-05-17 NOTE — Progress Notes (Signed)
PCP - Dr. Sherwood Gambler Cardiologist - Patient denies  Chest x-ray - n/a EKG - 05/17/2017 Stress Test - patient denies ECHO - patient denies Cardiac Cath - patient denies  Sleep Study - patient denies CPAP -   Fasting Blood Sugar - patient unsure Checks Blood Sugar 0 times a day   Patient denies shortness of breath, fever, cough and chest pain at PAT appointment   Patient verbalized understanding of instructions that were given to them at the PAT appointment. Patient was also instructed that they will need to review over the PAT instructions again at home before surgery.

## 2017-05-17 NOTE — Pre-Procedure Instructions (Signed)
Tracy Bonilla  05/17/2017      [facility] - [place], [place] - [address] [place] [address] Phone: [phone] Fax: [phone]    Your procedure is scheduled on Monday 05/28/2017.  Report to [facility] Admitting at 1:10 P.M.  Call this number if you have problems the morning of surgery:  [phone]   Remember:  Do not eat food or drink liquids after midnight the night before your surgery.  Continue all other medications as directed by your physician except follow these medication instructions before surgery   Take these medicines the morning of surgery with A SIP OF WATER: Acetaminophen (Tylenol) - if needed Ear drops - if needed Eye drops - if needed  7 days prior to surgery STOP taking any Aspirin, Aleve, Naproxen, Ibuprofen, Motrin, Advil, Goody's, BC's, all herbal medications, fish oil, and all vitamins - including your nabumetone (Relafen).  However DO NOT stop your potassium chloride supplement.  WHAT DO I DO ABOUT MY DIABETES MEDICATION? Marland Kitchen Do not take oral diabetes medicines (pills) the morning of surgery.    How to Manage Your Diabetes Before and After Surgery  Why is it important to control my blood sugar before and after surgery? . Improving blood sugar levels before and after surgery helps healing and can limit problems. . A way of improving blood sugar control is eating a healthy diet by: o  Eating less sugar and carbohydrates o  Increasing activity/exercise o  Talking with your doctor about reaching your blood sugar goals . High blood sugars (greater than 180 mg/dL) can raise your risk of infections and slow your recovery, so you will need to focus on controlling your diabetes during the weeks before surgery. . Make sure that the doctor who takes care of your diabetes knows about your planned surgery including the date and location.  How do I manage my blood sugar before surgery? . Check your  blood sugar at least 4 times a day, starting 2 days before surgery, to make sure that the level is not too high or low. o Check your blood sugar the morning of your surgery when you wake up and every 2 hours until you get to the Short Stay unit. . If your blood sugar is less than 70 mg/dL, you will need to treat for low blood sugar: o Do not take insulin. o Treat a low blood sugar (less than 70 mg/dL) with  cup of clear juice (cranberry or apple), 4 glucose tablets, OR glucose gel. o Recheck blood sugar in 15 minutes after treatment (to make sure it is greater than 70 mg/dL). If your blood sugar is not greater than 70 mg/dL on recheck, call [phone] for further instructions. . Report your blood sugar to the short stay nurse when you get to Short Stay.  . If you are admitted to the hospital after surgery: o Your blood sugar will be checked by the staff and you will probably be given insulin after surgery (instead of oral diabetes medicines) to make sure you have good blood sugar levels. o The goal for blood sugar control after surgery is 80-180 mg/dL.      Do not wear jewelry, make-up or nail polish.  Do not wear lotions, powders, or perfumes, or deodorant.  Do not shave 48 hours prior to surgery.    Do not bring valuables to the hospital.  [facility] is not responsible for any belongings or valuables.  Contacts,  eyeglasses, dentures or bridgework may not be worn into surgery.  Leave your suitcase in the car.  After surgery it may be brought to your room.  For patients admitted to the hospital, discharge time will be determined by your treatment team.  Patients discharged the day of surgery will not be allowed to drive home.   Name and phone number of your driver:    Special instructions:   [facility]- Preparing For Surgery  Before surgery, you can play an important role. Because skin is not sterile, your skin needs to be as free of germs as possible. You can reduce the number of  germs on your skin by washing with CHG (chlorahexidine gluconate) Soap before surgery.  CHG is an antiseptic cleaner which kills germs and bonds with the skin to continue killing germs even after washing.  Please do not use if you have an allergy to CHG or antibacterial soaps. If your skin becomes reddened/irritated stop using the CHG.  Do not shave (including legs and underarms) for at least 48 hours prior to first CHG shower. It is OK to shave your face.  Please follow these instructions carefully.   1. Shower the NIGHT BEFORE SURGERY and the MORNING OF SURGERY with CHG.   2. If you chose to wash your hair, wash your hair first as usual with your normal shampoo.  3. After you shampoo, rinse your hair and body thoroughly to remove the shampoo.  4. Use CHG as you would any other liquid soap. You can apply CHG directly to the skin and wash gently with a scrungie or a clean washcloth.   5. Apply the CHG Soap to your body ONLY FROM THE NECK DOWN.  Do not use on open wounds or open sores. Avoid contact with your eyes, ears, mouth and genitals (private parts). Wash genitals (private parts) with your normal soap.  6. Wash thoroughly, paying special attention to the area where your surgery will be performed.  7. Thoroughly rinse your body with warm water from the neck down.  8. DO NOT shower/wash with your normal soap after using and rinsing off the CHG Soap.  9. Pat yourself dry with a CLEAN TOWEL.   10. Wear CLEAN PAJAMAS   11. Place CLEAN SHEETS on your bed the night of your first shower and DO NOT SLEEP WITH PETS.    Day of Surgery: Shower as stated above. Do not apply any deodorants/lotions. Please wear clean clothes to the hospital/surgery center.      Please read over the following fact sheets that you were given. Pain Booklet, Coughing and Deep Breathing, Total Joint Packet, MRSA Information and Surgical Site Infection Prevention

## 2017-05-25 MED ORDER — ACETAMINOPHEN 10 MG/ML IV SOLN
1000.0000 mg | INTRAVENOUS | Status: AC
  Administered 2017-05-28: 1000 mg via INTRAVENOUS
  Filled 2017-05-25: qty 100

## 2017-05-25 MED ORDER — TRANEXAMIC ACID 1000 MG/10ML IV SOLN
1000.0000 mg | INTRAVENOUS | Status: AC
  Administered 2017-05-28: 1000 mg via INTRAVENOUS
  Filled 2017-05-25: qty 10

## 2017-05-25 MED ORDER — SODIUM CHLORIDE 0.9 % IV SOLN: INTRAVENOUS | Status: DC

## 2017-05-25 MED ORDER — CEFAZOLIN SODIUM-DEXTROSE 2-4 GM/100ML-% IV SOLN
2.0000 g | INTRAVENOUS | Status: AC
  Administered 2017-05-28: 2 g via INTRAVENOUS
  Filled 2017-05-25: qty 100

## 2017-05-28 ENCOUNTER — Encounter (HOSPITAL_COMMUNITY): Payer: Self-pay | Admitting: *Deleted

## 2017-05-28 ENCOUNTER — Inpatient Hospital Stay (HOSPITAL_COMMUNITY): Payer: Medicare Other | Admitting: Emergency Medicine

## 2017-05-28 ENCOUNTER — Encounter (HOSPITAL_COMMUNITY)
Admission: RE | Disposition: A | Discharge status: Home Health | Payer: Self-pay | Source: Ambulatory Visit | Attending: Orthopedic Surgery

## 2017-05-28 ENCOUNTER — Inpatient Hospital Stay (HOSPITAL_COMMUNITY): Payer: Medicare Other

## 2017-05-28 ENCOUNTER — Inpatient Hospital Stay (HOSPITAL_COMMUNITY)
Admission: RE | Admit: 2017-05-28 | Discharge: 2017-05-30 | DRG: 470 | Disposition: A | Discharge status: Home Health | Payer: Medicare Other | Source: Ambulatory Visit | Attending: Orthopedic Surgery | Admitting: Orthopedic Surgery

## 2017-05-28 ENCOUNTER — Inpatient Hospital Stay (HOSPITAL_COMMUNITY): Payer: Medicare Other | Admitting: Anesthesiology

## 2017-05-28 DIAGNOSIS — E785 Hyperlipidemia, unspecified: Secondary | ICD-10-CM | POA: Diagnosis not present

## 2017-05-28 DIAGNOSIS — Z96652 Presence of left artificial knee joint: Secondary | ICD-10-CM | POA: Diagnosis present

## 2017-05-28 DIAGNOSIS — I10 Essential (primary) hypertension: Secondary | ICD-10-CM | POA: Diagnosis not present

## 2017-05-28 DIAGNOSIS — E119 Type 2 diabetes mellitus without complications: Secondary | ICD-10-CM | POA: Diagnosis present

## 2017-05-28 DIAGNOSIS — G8918 Other acute postprocedural pain: Secondary | ICD-10-CM | POA: Diagnosis not present

## 2017-05-28 DIAGNOSIS — Z471 Aftercare following joint replacement surgery: Secondary | ICD-10-CM | POA: Diagnosis not present

## 2017-05-28 DIAGNOSIS — M1711 Unilateral primary osteoarthritis, right knee: Secondary | ICD-10-CM | POA: Diagnosis not present

## 2017-05-28 DIAGNOSIS — Z96651 Presence of right artificial knee joint: Secondary | ICD-10-CM | POA: Diagnosis not present

## 2017-05-28 DIAGNOSIS — Z79899 Other long term (current) drug therapy: Secondary | ICD-10-CM

## 2017-05-28 DIAGNOSIS — Z09 Encounter for follow-up examination after completed treatment for conditions other than malignant neoplasm: Secondary | ICD-10-CM

## 2017-05-28 DIAGNOSIS — M25561 Pain in right knee: Secondary | ICD-10-CM | POA: Diagnosis not present

## 2017-05-28 HISTORY — PX: KNEE ARTHROPLASTY: SHX992

## 2017-05-28 LAB — GLUCOSE, CAPILLARY
Glucose-Capillary: 115 mg/dL — ABNORMAL HIGH (ref 65–99)
Glucose-Capillary: 123 mg/dL — ABNORMAL HIGH (ref 65–99)
Glucose-Capillary: 151 mg/dL — ABNORMAL HIGH (ref 65–99)
Glucose-Capillary: 179 mg/dL — ABNORMAL HIGH (ref 65–99)

## 2017-05-28 SURGERY — ARTHROPLASTY, KNEE, TOTAL, USING IMAGELESS COMPUTER-ASSISTED NAVIGATION
Anesthesia: Spinal | Laterality: Right

## 2017-05-28 MED ORDER — FENTANYL CITRATE (PF) 100 MCG/2ML IJ SOLN
INTRAMUSCULAR | Status: AC
  Filled 2017-05-28: qty 2

## 2017-05-28 MED ORDER — ASPIRIN 81 MG PO CHEW
81.0000 mg | CHEWABLE_TABLET | Freq: Two times a day (BID) | ORAL | Status: DC
  Administered 2017-05-28 – 2017-05-30 (×4): 81 mg via ORAL
  Filled 2017-05-28 (×4): qty 1

## 2017-05-28 MED ORDER — METFORMIN HCL ER 500 MG PO TB24
500.0000 mg | ORAL_TABLET | Freq: Two times a day (BID) | ORAL | Status: DC
  Administered 2017-05-28 – 2017-05-30 (×4): 500 mg via ORAL
  Filled 2017-05-28 (×4): qty 1

## 2017-05-28 MED ORDER — CHLORHEXIDINE GLUCONATE 4 % EX LIQD: 60.0000 mL | Freq: Once | CUTANEOUS | Status: DC

## 2017-05-28 MED ORDER — KETOROLAC TROMETHAMINE 30 MG/ML IJ SOLN
INTRAMUSCULAR | Status: AC
  Filled 2017-05-28: qty 1

## 2017-05-28 MED ORDER — FENTANYL CITRATE (PF) 100 MCG/2ML IJ SOLN
25.0000 ug | INTRAMUSCULAR | Status: DC | PRN
  Administered 2017-05-28 (×2): 50 ug via INTRAVENOUS

## 2017-05-28 MED ORDER — HYDROCHLOROTHIAZIDE 25 MG PO TABS
25.0000 mg | ORAL_TABLET | Freq: Every day | ORAL | Status: DC
  Administered 2017-05-29 – 2017-05-30 (×2): 25 mg via ORAL
  Filled 2017-05-28 (×2): qty 1

## 2017-05-28 MED ORDER — LOSARTAN POTASSIUM 50 MG PO TABS
50.0000 mg | ORAL_TABLET | Freq: Two times a day (BID) | ORAL | Status: DC
  Administered 2017-05-28 – 2017-05-30 (×4): 50 mg via ORAL
  Filled 2017-05-28 (×5): qty 1

## 2017-05-28 MED ORDER — METOCLOPRAMIDE HCL 5 MG PO TABS: 5.0000 mg | ORAL_TABLET | Freq: Three times a day (TID) | ORAL | Status: DC | PRN

## 2017-05-28 MED ORDER — FENTANYL CITRATE (PF) 100 MCG/2ML IJ SOLN
50.0000 ug | Freq: Once | INTRAMUSCULAR | Status: AC
  Administered 2017-05-28: 50 ug via INTRAVENOUS

## 2017-05-28 MED ORDER — HYDROCODONE-ACETAMINOPHEN 5-325 MG PO TABS
1.0000 | ORAL_TABLET | ORAL | Status: DC | PRN
  Administered 2017-05-28 – 2017-05-30 (×6): 2 via ORAL
  Filled 2017-05-28 (×6): qty 2

## 2017-05-28 MED ORDER — BUPIVACAINE-EPINEPHRINE (PF) 0.5% -1:200000 IJ SOLN
INTRAMUSCULAR | Status: AC
  Filled 2017-05-28: qty 30

## 2017-05-28 MED ORDER — DEXAMETHASONE SODIUM PHOSPHATE 10 MG/ML IJ SOLN
10.0000 mg | Freq: Once | INTRAMUSCULAR | Status: AC
  Administered 2017-05-29: 10 mg via INTRAVENOUS
  Filled 2017-05-28: qty 1

## 2017-05-28 MED ORDER — POTASSIUM CHLORIDE CRYS ER 10 MEQ PO TBCR
10.0000 meq | EXTENDED_RELEASE_TABLET | Freq: Every day | ORAL | Status: DC
  Administered 2017-05-29 – 2017-05-30 (×2): 10 meq via ORAL
  Filled 2017-05-28 (×2): qty 1

## 2017-05-28 MED ORDER — PROPOFOL 500 MG/50ML IV EMUL
INTRAVENOUS | Status: DC | PRN
  Administered 2017-05-28: 50 ug/kg/min via INTRAVENOUS

## 2017-05-28 MED ORDER — PROPOFOL 1000 MG/100ML IV EMUL
INTRAVENOUS | Status: AC
  Filled 2017-05-28: qty 100

## 2017-05-28 MED ORDER — ONDANSETRON HCL 4 MG/2ML IJ SOLN
4.0000 mg | Freq: Four times a day (QID) | INTRAMUSCULAR | Status: DC | PRN
  Administered 2017-05-28 – 2017-05-29 (×2): 4 mg via INTRAVENOUS
  Filled 2017-05-28 (×3): qty 2

## 2017-05-28 MED ORDER — ACETAMINOPHEN 325 MG PO TABS: 650.0000 mg | ORAL_TABLET | Freq: Four times a day (QID) | ORAL | Status: DC | PRN

## 2017-05-28 MED ORDER — SODIUM CHLORIDE 0.9 % IV SOLN: INTRAVENOUS | Status: DC

## 2017-05-28 MED ORDER — FLUTICASONE PROPIONATE 50 MCG/ACT NA SUSP
1.0000 | Freq: Every day | NASAL | Status: DC
  Administered 2017-05-29: 1 via NASAL
  Filled 2017-05-28: qty 16

## 2017-05-28 MED ORDER — MIDAZOLAM HCL 2 MG/2ML IJ SOLN
INTRAMUSCULAR | Status: AC
  Filled 2017-05-28: qty 2

## 2017-05-28 MED ORDER — CEFAZOLIN SODIUM-DEXTROSE 2-4 GM/100ML-% IV SOLN
2.0000 g | Freq: Four times a day (QID) | INTRAVENOUS | Status: AC
  Administered 2017-05-28 (×2): 2 g via INTRAVENOUS
  Filled 2017-05-28 (×2): qty 100

## 2017-05-28 MED ORDER — HYDROMORPHONE HCL 1 MG/ML IJ SOLN
0.5000 mg | INTRAMUSCULAR | Status: DC | PRN
  Administered 2017-05-28: 1 mg via INTRAVENOUS
  Filled 2017-05-28: qty 1

## 2017-05-28 MED ORDER — MENTHOL 3 MG MT LOZG
1.0000 | LOZENGE | OROMUCOSAL | Status: DC | PRN
Start: 2017-05-28 — End: 2017-05-30

## 2017-05-28 MED ORDER — PHENOL 1.4 % MT LIQD: 1.0000 | OROMUCOSAL | Status: DC | PRN

## 2017-05-28 MED ORDER — FENTANYL CITRATE (PF) 100 MCG/2ML IJ SOLN
INTRAMUSCULAR | Status: DC | PRN
  Administered 2017-05-28: 50 ug via INTRAVENOUS

## 2017-05-28 MED ORDER — BUPIVACAINE-EPINEPHRINE (PF) 0.5% -1:200000 IJ SOLN
INTRAMUSCULAR | Status: DC | PRN
  Administered 2017-05-28: 30 mL

## 2017-05-28 MED ORDER — ROPIVACAINE HCL 7.5 MG/ML IJ SOLN
INTRAMUSCULAR | Status: DC | PRN
  Administered 2017-05-28: 20 mL via PERINEURAL

## 2017-05-28 MED ORDER — POVIDONE-IODINE 10 % EX SWAB: 2.0000 "application " | Freq: Once | CUTANEOUS | Status: DC

## 2017-05-28 MED ORDER — SODIUM CHLORIDE 0.9 % IJ SOLN
INTRAMUSCULAR | Status: DC | PRN
  Administered 2017-05-28: 30 mL

## 2017-05-28 MED ORDER — INSULIN ASPART 100 UNIT/ML ~~LOC~~ SOLN
0.0000 [IU] | Freq: Three times a day (TID) | SUBCUTANEOUS | Status: DC
  Administered 2017-05-28 – 2017-05-29 (×2): 3 [IU] via SUBCUTANEOUS
  Administered 2017-05-30: 2 [IU] via SUBCUTANEOUS

## 2017-05-28 MED ORDER — MIDAZOLAM HCL 5 MG/5ML IJ SOLN
INTRAMUSCULAR | Status: DC | PRN
  Administered 2017-05-28: 2 mg via INTRAVENOUS

## 2017-05-28 MED ORDER — ALUM & MAG HYDROXIDE-SIMETH 200-200-20 MG/5ML PO SUSP: 30.0000 mL | ORAL | Status: DC | PRN

## 2017-05-28 MED ORDER — KETOROLAC TROMETHAMINE 30 MG/ML IJ SOLN
INTRAMUSCULAR | Status: DC | PRN
  Administered 2017-05-28: 30 mg via INTRAVENOUS

## 2017-05-28 MED ORDER — MIDAZOLAM HCL 2 MG/2ML IJ SOLN
2.0000 mg | Freq: Once | INTRAMUSCULAR | Status: AC
  Administered 2017-05-28: 2 mg via INTRAVENOUS

## 2017-05-28 MED ORDER — DIPHENHYDRAMINE HCL 12.5 MG/5ML PO ELIX: 12.5000 mg | ORAL_SOLUTION | ORAL | Status: DC | PRN

## 2017-05-28 MED ORDER — PROPOFOL 10 MG/ML IV BOLUS
INTRAVENOUS | Status: AC
  Filled 2017-05-28: qty 20

## 2017-05-28 MED ORDER — ONDANSETRON HCL 4 MG PO TABS: 4.0000 mg | ORAL_TABLET | Freq: Four times a day (QID) | ORAL | Status: DC | PRN

## 2017-05-28 MED ORDER — ACETAMINOPHEN 650 MG RE SUPP: 650.0000 mg | Freq: Four times a day (QID) | RECTAL | Status: DC | PRN

## 2017-05-28 MED ORDER — METHOCARBAMOL 1000 MG/10ML IJ SOLN: 500.0000 mg | Freq: Four times a day (QID) | INTRAVENOUS | Status: DC | PRN

## 2017-05-28 MED ORDER — KETOROLAC TROMETHAMINE 15 MG/ML IJ SOLN
7.5000 mg | Freq: Four times a day (QID) | INTRAMUSCULAR | Status: AC
  Administered 2017-05-28 – 2017-05-29 (×4): 7.5 mg via INTRAVENOUS
  Filled 2017-05-28 (×4): qty 1

## 2017-05-28 MED ORDER — METOCLOPRAMIDE HCL 5 MG/ML IJ SOLN: 5.0000 mg | Freq: Three times a day (TID) | INTRAMUSCULAR | Status: DC | PRN

## 2017-05-28 MED ORDER — FENTANYL CITRATE (PF) 250 MCG/5ML IJ SOLN
INTRAMUSCULAR | Status: AC
  Filled 2017-05-28: qty 5

## 2017-05-28 MED ORDER — ATORVASTATIN CALCIUM 20 MG PO TABS
20.0000 mg | ORAL_TABLET | Freq: Every day | ORAL | Status: DC
  Administered 2017-05-28 – 2017-05-29 (×2): 20 mg via ORAL
  Filled 2017-05-28 (×2): qty 1

## 2017-05-28 MED ORDER — FENTANYL CITRATE (PF) 100 MCG/2ML IJ SOLN
INTRAMUSCULAR | Status: AC
  Administered 2017-05-28: 50 ug via INTRAVENOUS
  Filled 2017-05-28: qty 2

## 2017-05-28 MED ORDER — MIDAZOLAM HCL 2 MG/2ML IJ SOLN
INTRAMUSCULAR | Status: AC
Start: 2017-05-28 — End: 2017-05-28
  Administered 2017-05-28: 2 mg via INTRAVENOUS
  Filled 2017-05-28: qty 2

## 2017-05-28 MED ORDER — CANAGLIFLOZIN 100 MG PO TABS
100.0000 mg | ORAL_TABLET | Freq: Every day | ORAL | Status: DC
  Administered 2017-05-28 – 2017-05-30 (×3): 100 mg via ORAL
  Filled 2017-05-28 (×3): qty 1

## 2017-05-28 MED ORDER — TETRAHYDROZOLINE HCL 0.05 % OP SOLN: 2.0000 [drp] | Freq: Three times a day (TID) | OPHTHALMIC | Status: DC | PRN

## 2017-05-28 MED ORDER — SODIUM CHLORIDE 0.9 % IR SOLN
Status: DC | PRN
  Administered 2017-05-28: 3000 mL

## 2017-05-28 MED ORDER — DOCUSATE SODIUM 100 MG PO CAPS
100.0000 mg | ORAL_CAPSULE | Freq: Two times a day (BID) | ORAL | Status: DC
  Administered 2017-05-28 – 2017-05-30 (×4): 100 mg via ORAL
  Filled 2017-05-28 (×5): qty 1

## 2017-05-28 MED ORDER — METHYLCELLULOSE (LAXATIVE) 500 MG PO TABS: 500.0000 mg | ORAL_TABLET | Freq: Every day | ORAL | Status: DC

## 2017-05-28 MED ORDER — LACTATED RINGERS IV SOLN
INTRAVENOUS | Status: DC | PRN
  Administered 2017-05-28 (×2): via INTRAVENOUS

## 2017-05-28 MED ORDER — SENNA 8.6 MG PO TABS: 2.0000 | ORAL_TABLET | Freq: Every day | ORAL | Status: DC

## 2017-05-28 MED ORDER — LIDOCAINE 2% (20 MG/ML) 5 ML SYRINGE
INTRAMUSCULAR | Status: AC
  Filled 2017-05-28: qty 5

## 2017-05-28 MED ORDER — METHOCARBAMOL 500 MG PO TABS
500.0000 mg | ORAL_TABLET | Freq: Four times a day (QID) | ORAL | Status: DC | PRN
  Administered 2017-05-29 – 2017-05-30 (×3): 500 mg via ORAL
  Filled 2017-05-28 (×3): qty 1

## 2017-05-28 SURGICAL SUPPLY — 45 items
ADH SKN CLS APL DERMABOND .7 (GAUZE/BANDAGES/DRESSINGS) ×1
ALCOHOL ISOPROPYL (RUBBING) (MISCELLANEOUS) ×3 IMPLANT
BANDAGE ELASTIC 6 VELCRO ST LF (GAUZE/BANDAGES/DRESSINGS) ×3 IMPLANT
BLADE SAW RECIP 87.9 MT (BLADE) ×3 IMPLANT
BNDG ELASTIC 6X10 VLCR STRL LF (GAUZE/BANDAGES/DRESSINGS) ×3 IMPLANT
CAPT KNEE TRIATH TK-4 ×3 IMPLANT
CHLORAPREP W/TINT 26ML (MISCELLANEOUS) ×6 IMPLANT
CUFF TOURNIQUET SINGLE 34IN LL (TOURNIQUET CUFF) ×3 IMPLANT
DERMABOND ADVANCED (GAUZE/BANDAGES/DRESSINGS) ×2
DERMABOND ADVANCED .7 DNX12 (GAUZE/BANDAGES/DRESSINGS) ×1 IMPLANT
DRAIN HEMOVAC 7FR (DRAIN) IMPLANT
DRAPE EXTREMITY T 121X128X90 (DRAPE) ×3 IMPLANT
DRAPE U-SHAPE 47X51 STRL (DRAPES) ×3 IMPLANT
DRAPE UNIVERSAL PACK (DRAPES) ×3 IMPLANT
DRESSING AQUACEL AQ EXTRA 4X5 (GAUZE/BANDAGES/DRESSINGS) ×3 IMPLANT
DRSG AQUACEL AG ADV 3.5X14 (GAUZE/BANDAGES/DRESSINGS) ×3 IMPLANT
ELECT REM PT RETURN 9FT ADLT (ELECTROSURGICAL) ×3
ELECTRODE REM PT RTRN 9FT ADLT (ELECTROSURGICAL) ×1 IMPLANT
EVACUATOR 1/8 PVC DRAIN (DRAIN) IMPLANT
GLOVE BIO SURGEON STRL SZ8.5 (GLOVE) ×6 IMPLANT
GLOVE BIOGEL PI IND STRL 8.5 (GLOVE) ×1 IMPLANT
GLOVE BIOGEL PI INDICATOR 8.5 (GLOVE) ×2
GOWN STRL REUS W/TWL 2XL LVL3 (GOWN DISPOSABLE) ×3 IMPLANT
HANDPIECE INTERPULSE COAX TIP (DISPOSABLE) ×2
HOOD PEEL AWAY FLYTE STAYCOOL (MISCELLANEOUS) ×6 IMPLANT
KIT BASIN OR (CUSTOM PROCEDURE TRAY) ×3 IMPLANT
MANIFOLD NEPTUNE II (INSTRUMENTS) ×3 IMPLANT
NEEDLE SPNL 18GX3.5 QUINCKE PK (NEEDLE) ×6 IMPLANT
PACK TOTAL JOINT (CUSTOM PROCEDURE TRAY) ×3 IMPLANT
PACK TOTAL KNEE CUSTOM (KITS) ×3 IMPLANT
SAW OSC TIP CART 19.5X105X1.3 (SAW) ×3 IMPLANT
SEALER BIPOLAR AQUA 6.0 (INSTRUMENTS) ×3 IMPLANT
SET HNDPC FAN SPRY TIP SCT (DISPOSABLE) ×1 IMPLANT
SET PAD KNEE POSITIONER (MISCELLANEOUS) ×3 IMPLANT
SUT MNCRL AB 3-0 PS2 27 (SUTURE) ×3 IMPLANT
SUT MON AB 2-0 CT1 36 (SUTURE) ×6 IMPLANT
SUT VIC AB 1 CTX 27 (SUTURE) ×6 IMPLANT
SUT VIC AB 2-0 CT1 27 (SUTURE) ×2
SUT VIC AB 2-0 CT1 TAPERPNT 27 (SUTURE) ×1 IMPLANT
SUT VLOC 180 0 24IN GS25 (SUTURE) ×3 IMPLANT
SYR 50ML LL SCALE MARK (SYRINGE) ×6 IMPLANT
TOWER CARTRIDGE SMART MIX (DISPOSABLE) IMPLANT
TRAY CATH 16FR W/PLASTIC CATH (SET/KITS/TRAYS/PACK) IMPLANT
TRAY FOLEY CATH SILVER 14FR (SET/KITS/TRAYS/PACK) ×3 IMPLANT
WRAP KNEE MAXI GEL POST OP (GAUZE/BANDAGES/DRESSINGS) ×3 IMPLANT

## 2017-05-28 NOTE — Op Note (Signed)
OPERATIVE REPORT  SURGEON: Samson Frederic, MD   ASSISTANT: Hart Carwin, RNFA  PREOPERATIVE DIAGNOSIS: Right knee arthritis.   POSTOPERATIVE DIAGNOSIS: Right knee arthritis.   PROCEDURE: Right total knee arthroplasty.   IMPLANTS: Stryker Triathlon CR femur, size 3. Stryker Tritanium tibia, size 3. X3 polyethelyene insert, size 9 mm, CR. 3 button asymmetric patella, size 29 mm.  ANESTHESIA:  Regional and Spinal  TOURNIQUET TIME: Not utilized.   ESTIMATED BLOOD LOSS: 350 mL.  ANTIBIOTICS: 2 g Ancef.  DRAINS: None.  COMPLICATIONS: None   CONDITION: PACU - hemodynamically stable.   BRIEF CLINICAL NOTE: Tracy Bonilla is a 66 y.o. female with a long-standing history of Right knee arthritis. After failing conservative management, the patient was indicated for total knee arthroplasty. The risks, benefits, and alternatives to the procedure were explained, and the patient elected to proceed.  PROCEDURE IN DETAIL: Adductor canal block was obtained in the pre-op holding area. Once inside the operative room, spinal anesthesia was obtained, and a foley catheter was inserted. The patient was then positioned, a nonsterile tourniquet was placed, and the lower extremity was prepped and draped in the normal sterile surgical fashion. A time-out was called verifying side and site of surgery. The patient received IV antibiotics within 60 minutes of beginning the procedure. The tourniquet was not utilized.  An anterior approach to the knee was performed utilizing a midvastus arthrotomy. A medial release was performed and the patellar fat pad was excised. Stryker navigation was used to cut the distal femur perpendicular to the mechanical axis. A freehand patellar resection was performed, and the patella was sized an prepared with 3 lug holes.  Nagivation was used to make a neutral proximal tibia  resection, taking 8 mm of bone from the less affected lateral side with 3 degrees of slope. The menisci were excised. A spacer block was placed, and the alignment and balance in extension were confirmed.   The distal femur was sized using the 3-degree external rotation guide referencing the posterior femoral cortex. The appropriate 4-in-1 cutting block was pinned into place. Rotation was checked using Whiteside's line, the epicondylar axis, and then confirmed with a spacer block in flexion. The remaining femoral cuts were performed, taking care to protect the MCL.  The tibia was sized and the trial tray was pinned into place. The remaining trail components were inserted. The knee was stable to varus and valgus stress through a full range of motion. The patella tracked centrally, and the PCL was well balanced. The trial components were removed, and the proximal tibial surface was prepared. Final components were impacted into place. The knee was tested for a final time and found to be well balanced.  The wound was copiously irrigated with a dilute betadine solution followed by normal saline with pulse lavage. Marcaine solution was injected into the periarticular soft tissue. The wound was closed in layers using #1 Vicryl and Stratafix for the fascia, 2-0 Vicryl for the subcutaneous fat, 2-0 Monocryl for the deep dermal layer, 3-0 running Monocryl subcuticular Stitch, and Dermabond for the skin. Once the glue was fully dried, an Aquacell Ag and compressive dressing were applied. Tthe patient was transported to the recovery room in stable condition. Sponge, needle, and instrument counts were correct at the end of the case x2. The patient tolerated the procedure well and there were no known complications.

## 2017-05-28 NOTE — H&P (View-Only) (Signed)
TOTAL KNEE ADMISSION H&P  Patient is being admitted for right total knee arthroplasty.  Subjective:  Chief Complaint:right knee pain.  HPI: Tracy Bonilla, 66 y.o. female, has a history of pain and functional disability in the right knee due to arthritis and has failed non-surgical conservative treatments for greater than 12 weeks to includeNSAID's and/or analgesics, corticosteriod injections, flexibility and strengthening excercises, use of assistive devices, weight reduction as appropriate and activity modification.  Onset of symptoms was gradual, starting 5 years ago with gradually worsening course since that time. The patient noted no past surgery on the right knee(s).  Patient currently rates pain in the right knee(s) at 10 out of 10 with activity. Patient has night pain, worsening of pain with activity and weight bearing, pain that interferes with activities of daily living, pain with passive range of motion, crepitus and joint swelling.  Patient has evidence of subchondral cysts, subchondral sclerosis, periarticular osteophytes and joint space narrowing by imaging studies. There is no active infection.  Patient Active Problem List   Diagnosis Date Noted  . Primary osteoarthritis of left knee 05/14/2015  . Unspecified essential hypertension 03/03/2014    Class: History of  . Type 2 diabetes mellitus (HCC) 03/03/2014    Class: History of  . CONTRACTURE OF SHOULDER JOINT 12/03/2007  . SHOULDER PAIN 12/03/2007  . IMPINGEMENT SYNDROME 12/03/2007   Past Medical History:  Diagnosis Date  . Arthritis   . Complication of anesthesia   . Diabetes mellitus without complication (HCC)   . Hyperlipidemia   . Hypertension   . PONV (postoperative nausea and vomiting)     Past Surgical History:  Procedure Laterality Date  . arthroscopic surgery Left   . BREAST SURGERY Left    1998  . cataract surgery    . DILATION AND CURETTAGE OF UTERUS    . extra digit removed     thumb  . FOOT SURGERY  Right   . REFRACTIVE SURGERY     both eyes  . TOTAL KNEE ARTHROPLASTY Left 05/14/2015   Procedure: LEFT TOTAL KNEE ARTHROPLASTY;  Surgeon: Samson Frederic, MD;  Location: [facility];  Service: Orthopedics;  Laterality: Left;  . TUBAL LIGATION       (Not in a hospital admission) No Known Allergies  Social History  Substance Use Topics  . Smoking status: Never Smoker  . Smokeless tobacco: Never Used  . Alcohol use No    Family History  Problem Relation Age of Onset  . Cancer Mother        liver  . Hypertension Mother   . Heart disease Mother        CVD  . Cancer Father        lymphoma     Review of Systems  Constitutional: Negative.   HENT: Negative.   Eyes: Negative.   Respiratory: Negative.   Cardiovascular: Negative.   Gastrointestinal: Negative.   Genitourinary: Negative.   Musculoskeletal: Positive for joint pain.  Skin: Negative.   Neurological: Negative.   Endo/Heme/Allergies: Negative.   Psychiatric/Behavioral: Negative.     Objective:  Physical Exam  Vitals reviewed. Constitutional: She is oriented to person, place, and time. She appears well-developed and well-nourished.  HENT:  Head: Normocephalic and atraumatic.  Eyes: Pupils are equal, round, and reactive to light. Conjunctivae and EOM are normal.  Neck: Normal range of motion. Neck supple.  Cardiovascular: Normal rate and normal heart sounds.   Respiratory: Effort normal and breath sounds normal.  GI: Soft. Bowel sounds are  normal.  Genitourinary:  Genitourinary Comments: deferred  Musculoskeletal:       Right knee: She exhibits decreased range of motion, swelling, effusion, deformity and abnormal alignment. Tenderness found. Medial joint line and lateral joint line tenderness noted.  Neurological: She is alert and oriented to person, place, and time. She has normal reflexes.  Skin: Skin is warm and dry.  Psychiatric: She has a normal mood and affect. Her behavior is normal. Judgment and thought  content normal.    Vital signs in last 24 hours: @  Labs:   Estimated body mass index is 31.58 kg/m as calculated from the following:   Height as of 02/26/17:  (1.626 m).   Weight as of 02/26/17: 83.5 kg (184 lb).   Imaging Review Plain radiographs demonstrate severe degenerative joint disease of the right knee(s). The overall alignment issignificant varus. The bone quality appears to be adequate for age and reported activity level.  Assessment/Plan:  End stage arthritis, right knee   The patient history, physical examination, clinical judgment of the provider and imaging studies are consistent with end stage degenerative joint disease of the right knee(s) and total knee arthroplasty is deemed medically necessary. The treatment options including medical management, injection therapy arthroscopy and arthroplasty were discussed at length. The risks and benefits of total knee arthroplasty were presented and reviewed. The risks due to aseptic loosening, infection, stiffness, patella tracking problems, thromboembolic complications and other imponderables were discussed. The patient acknowledged the explanation, agreed to proceed with the plan and consent was signed. Patient is being admitted for inpatient treatment for surgery, pain control, PT, OT, prophylactic antibiotics, VTE prophylaxis, progressive ambulation and ADL's and discharge planning. The patient is planning to be discharged home with home health services

## 2017-05-28 NOTE — Anesthesia Procedure Notes (Signed)
Spinal  Patient location during procedure: OR Staffing Anesthesiologist: Koben Daman Spinal Block Patient position: sitting Prep: DuraPrep Patient monitoring: heart rate, blood pressure and continuous pulse ox Approach: right paramedian Location: L3-4 Injection technique: single-shot Needle Needle type: Sprotte  Needle gauge: 24 G Needle length: 9 cm Assessment Sensory level: T4 Additional Notes Spinal Dosage in OR  .75% Bupivicaine ml       1.8      

## 2017-05-28 NOTE — Anesthesia Postprocedure Evaluation (Signed)
Anesthesia Post Note  Patient: Tracy Bonilla  Procedure(s) Performed: COMPUTER ASSISTED TOTAL KNEE ARTHROPLASTY (Right )     Patient location during evaluation: PACU Anesthesia Type: Spinal Level of consciousness: awake and alert Pain management: pain level controlled Vital Signs Assessment: post-procedure vital signs reviewed and stable Respiratory status: spontaneous breathing and respiratory function stable Cardiovascular status: blood pressure returned to baseline and stable Postop Assessment: spinal receding Anesthetic complications: no    Last Vitals:  Vitals:   05/28/17 1535 05/28/17 1550  BP: (!) 147/70 139/64  Pulse: 75 67  Resp: 11 12  Temp:  36.4 C  SpO2: 96% 94%    Last Pain:  Vitals:   05/28/17 1550  TempSrc:   PainSc: Asleep                 [name] DANIEL

## 2017-05-28 NOTE — Anesthesia Preprocedure Evaluation (Signed)
Anesthesia Evaluation  Patient identified by MRN, date of birth, ID band Patient awake    Reviewed: Allergy & Precautions, H&P , Patient's Chart, lab work & pertinent test results  Airway Mallampati: II  TM Distance: >3 FB Neck ROM: full    Dental no notable dental hx.    Pulmonary    Pulmonary exam normal breath sounds clear to auscultation       Cardiovascular Exercise Tolerance: Good hypertension,  Rhythm:regular Rate:Normal     Neuro/Psych    GI/Hepatic   Endo/Other  diabetes  Renal/GU      Musculoskeletal   Abdominal   Peds  Hematology   Anesthesia Other Findings   Reproductive/Obstetrics                             Anesthesia Physical  Anesthesia Plan  ASA: II  Anesthesia Plan: Spinal   Post-op Pain Management:    Induction:   PONV Risk Score and Plan:   Airway Management Planned:   Additional Equipment:   Intra-op Plan:   Post-operative Plan:   Informed Consent: I have reviewed the patients History and Physical, chart, labs and discussed the procedure including the risks, benefits and alternatives for the proposed anesthesia with the patient or authorized representative who has indicated his/her understanding and acceptance.   Dental Advisory Given  Plan Discussed with: CRNA  Anesthesia Plan Comments: (Lab work confirmed with CRNA in room. Platelets okay. Discussed spinal anesthetic, and patient consents to the procedure:  included risk of possible headache,backache, failed block, allergic reaction, and nerve injury. This patient was asked if she had any questions or concerns before the procedure started. )        Anesthesia Quick Evaluation

## 2017-05-28 NOTE — Interval H&P Note (Signed)
History and Physical Interval Note:  05/28/2017 11:57 AM  Tracy Bonilla  has presented today for surgery, with the diagnosis of Degenerative joint disease right knee  The various methods of treatment have been discussed with the patient and family. After consideration of risks, benefits and other options for treatment, the patient has consented to  Procedure(s): COMPUTER ASSISTED TOTAL KNEE ARTHROPLASTY (Right) as a surgical intervention .  The patient's history has been reviewed, patient examined, no change in status, stable for surgery.  I have reviewed the patient's chart and labs.  Questions were answered to the patient's satisfaction.     Xaniyah Buchholz, Cloyde Reams

## 2017-05-28 NOTE — Evaluation (Signed)
Physical Therapy Evaluation Patient Details Name: Tracy Bonilla MRN: [id number] DOB: March 07, 1951 Today's Date: 05/28/2017   History of Present Illness  Pt is a 66 y/o female s/p elective R TKA. PMH includes HTN, DM, arthritis, and L TKA.   Clinical Impression  Pt is s/p surgery above with deficits below. PTA, pt reports she was independent with functional mobility. Upon eval, pt very limited by pain and nausea. Only agreeable to performing bed mobility and RLE exercise and had limited tolerance for exercise despite education about importance of mobility. Required min guard to supervision for bed mobility this session. Pt reports she will be going home with assist from her husband. Anticipate pt will progress well once pain controlled. Follow up recommendations per MD arrangements. Will continue to follow acutely to maximize functional mobility independence and safety.     Follow Up Recommendations DC plan and follow up therapy as arranged by surgeon;Supervision/Assistance - 24 hour    Equipment Recommendations  None recommended by PT    Recommendations for Other Services       Precautions / Restrictions Precautions Precautions: Knee Precaution Booklet Issued: Yes (comment) Precaution Comments: Very limited tolerance secondary to pain.  Restrictions Weight Bearing Restrictions: Yes RLE Weight Bearing: Weight bearing as tolerated      Mobility  Bed Mobility Overal bed mobility: Needs Assistance Bed Mobility: Supine to Sit;Sit to Supine     Supine to sit: Min guard Sit to supine: Supervision   General bed mobility comments: Min guard for safety during supine to sit. Once pt up at EOB, refusing further therapy. States she felt nauseous and was in increased pain. Supervision for return to supine.   Transfers                 General transfer comment: Refused despite encouragement for movement.   Ambulation/Gait                Stairs            Wheelchair  Mobility    Modified Rankin (Stroke Patients Only)       Balance Overall balance assessment: Needs assistance Sitting-balance support: No upper extremity supported;Feet supported Sitting balance-Leahy Scale: Fair                                       Pertinent Vitals/Pain Pain Assessment: 0-10 Pain Score: 2  Pain Location: R knee  Pain Descriptors / Indicators: Aching;Operative site guarding Pain Intervention(s): Limited activity within patient's tolerance;Monitored during session;Repositioned    Home Living Family/patient expects to be discharged to:: Private residence Living Arrangements: Spouse/significant other Available Help at Discharge: Family;Available 24 hours/day Type of Home: House Home Access: Stairs to enter Entrance Stairs-Rails: Right;Left;Can reach both Entrance Stairs-Number of Steps: 3 Home Layout: One level Home Equipment: Walker - 2 wheels;Bedside commode;Cane - single point;Shower seat - built in      Prior Function Level of Independence: Independent               Hand Dominance   Dominant Hand: Left    Extremity/Trunk Assessment   Upper Extremity Assessment Upper Extremity Assessment: Defer to OT evaluation    Lower Extremity Assessment Lower Extremity Assessment: RLE deficits/detail RLE Deficits / Details: Sensory in tact. Deficits consistent with post op pain and weakness. able to perform ther ex below.     Cervical / Trunk Assessment Cervical / Trunk Assessment: Normal  Communication   Communication: No difficulties  Cognition Arousal/Alertness: Awake/alert Behavior During Therapy: WFL for tasks assessed/performed Overall Cognitive Status: Within Functional Limits for tasks assessed                                        General Comments General comments (skin integrity, edema, etc.): Pt's husband and son present during session.     Exercises Total Joint Exercises Ankle Circles/Pumps:  AROM;Both;20 reps Quad Sets: AROM;Right;10 reps Towel Squeeze: AROM;Both;10 reps   Assessment/Plan    PT Assessment Patient needs continued PT services  PT Problem List Decreased strength;Decreased range of motion;Decreased activity tolerance;Decreased balance;Decreased mobility;Decreased knowledge of use of DME;Decreased knowledge of precautions;Pain       PT Treatment Interventions DME instruction;Gait training;Stair training;Therapeutic activities;Functional mobility training;Therapeutic exercise;Balance training;Neuromuscular re-education;Patient/family education    PT Goals (Current goals can be found in the Care Plan section)  Acute Rehab PT Goals Patient Stated Goal: to feel better  PT Goal Formulation: With patient Time For Goal Achievement: 06/04/17 Potential to Achieve Goals: Good    Frequency 7X/week   Barriers to discharge        Co-evaluation               AM-PAC PT "6 Clicks" Daily Activity  Outcome Measure Difficulty turning over in bed (including adjusting bedclothes, sheets and blankets)?: None Difficulty moving from lying on back to sitting on the side of the bed? : None Difficulty sitting down on and standing up from a chair with arms (e.g., wheelchair, bedside commode, etc,.)?: Unable Help needed moving to and from a bed to chair (including a wheelchair)?: A Lot Help needed walking in hospital room?: A Lot Help needed climbing 3-5 steps with a railing? : Total 6 Click Score: 14    End of Session   Activity Tolerance: Patient limited by pain Patient left: in bed;with call bell/phone within reach;with family/visitor present Nurse Communication: Mobility status PT Visit Diagnosis: Other abnormalities of gait and mobility (R26.89);Pain Pain - Right/Left: Right Pain - part of body: Knee    Time: 1749-1810 PT Time Calculation (min) (ACUTE ONLY): 21 min   Charges:   PT Evaluation $PT Eval Low Complexity: 1 Low     PT G Codes:         Gladys Damme, PT, DPT  Acute Rehabilitation Services  Pager: [phone]   Lehman Prom 05/28/2017, 6:22 PM

## 2017-05-28 NOTE — Transfer of Care (Signed)
Immediate Anesthesia Transfer of Care Note  Patient: Tracy Bonilla  Procedure(s) Performed: COMPUTER ASSISTED TOTAL KNEE ARTHROPLASTY (Right )  Patient Location: PACU  Anesthesia Type:Regional and Spinal  Level of Consciousness: awake, alert , oriented and patient cooperative  Airway & Oxygen Therapy: Patient Spontanous Breathing and Patient connected to nasal cannula oxygen  Post-op Assessment: Report given to RN and Post -op Vital signs reviewed and stable  Post vital signs: Reviewed and stable  Last Vitals:  Vitals:   05/28/17 1205 05/28/17 1450  BP: (!) 163/71 125/72  Pulse: 88 88  Resp: (!) 6 15  Temp:  (!) (P) 36.4 C  SpO2: 98% 99%    Last Pain:  Vitals:   05/28/17 1104  TempSrc: Oral      Patients Stated Pain Goal: 3 (05/28/17 1132)  Complications: No apparent anesthesia complications

## 2017-05-28 NOTE — Anesthesia Procedure Notes (Addendum)
Anesthesia Regional Block: Adductor canal block   Pre-Anesthetic Checklist: ,, timeout performed, Correct Patient, Correct Site, Correct Laterality, Correct Procedure, Correct Position, site marked, Risks and benefits discussed, pre-op evaluation,  At surgeon's request and post-op pain management  Laterality: Right  Prep: chloraprep       Needles:   Needle Type: Echogenic Needle     Needle Length: 9cm  Needle Gauge: 21     Additional Needles:   Procedures:,,,, ultrasound used (permanent image in chart),,,,  Narrative:  Start time: 05/28/2017 12:01 PM End time: 05/28/2017 12:07 PM Injection made incrementally with aspirations every 5 mL. Anesthesiologist: Cristela Blue

## 2017-05-28 NOTE — Discharge Instructions (Signed)
° °Dr. Dafne Nield °Total Joint Specialist °[facility] °[address] °[place], [place] [address] °[phone] ° °TOTAL KNEE REPLACEMENT POSTOPERATIVE DIRECTIONS ° ° ° °Knee Rehabilitation, Guidelines Following Surgery  °Results after knee surgery are often greatly improved when you follow the exercise, range of motion and muscle strengthening exercises prescribed by your doctor. Safety measures are also important to protect the knee from further injury. Any time any of these exercises cause you to have increased pain or swelling in your knee joint, decrease the amount until you are comfortable again and slowly increase them. If you have problems or questions, call your caregiver or physical therapist for advice.  ° °WEIGHT BEARING °Weight bearing as tolerated with assist device (walker, cane, etc) as directed, use it as long as suggested by your surgeon or therapist, typically at least 4-6 weeks. ° °HOME CARE INSTRUCTIONS  °Remove items at home which could result in a fall. This includes throw rugs or furniture in walking pathways.  °Continue medications as instructed at time of discharge. °You may have some home medications which will be placed on hold until you complete the course of blood thinner medication.  °You may start showering once you are discharged home but do not submerge the incision under water. Just pat the incision dry and apply a dry gauze dressing on daily. °Walk with walker as instructed.  °You may resume a sexual relationship in one month or when given the OK by your doctor.  °· Use walker as long as suggested by your caregivers. °· Avoid periods of inactivity such as sitting longer than an hour when not asleep. This helps prevent blood clots.  °You may put full weight on your legs and walk as much as is comfortable.  °You may return to work once you are cleared by your doctor.  °Do not drive a car for 6 weeks or until released by you surgeon.  °· Do not drive  while taking narcotics.  °Wear the elastic stockings for three weeks following surgery during the day but you may remove then at night. °Make sure you keep all of your appointments after your operation with all of your doctors and caregivers. You should call the office at the above phone number and make an appointment for approximately two weeks after the date of your surgery. °Do not remove your surgical dressing. The dressing is waterproof; you may take showers in 3 days, but do not take tub baths or submerge the dressing. °Please pick up a stool softener and laxative for home use as long as you are requiring pain medications. °· ICE to the affected knee every three hours for 30 minutes at a time and then as needed for pain and swelling.  Continue to use ice on the knee for pain and swelling from surgery. You may notice swelling that will progress down to the foot and ankle.  This is normal after surgery.  Elevate the leg when you are not up walking on it.   °It is important for you to complete the blood thinner medication as prescribed by your doctor. °· Continue to use the breathing machine which will help keep your temperature down.  It is common for your temperature to cycle up and down following surgery, especially at night when you are not up moving around and exerting yourself.  The breathing machine keeps your lungs expanded and your temperature down. ° °RANGE OF MOTION AND STRENGTHENING EXERCISES  °Rehabilitation of the knee is important following   a knee injury or an operation. After just a few days of immobilization, the muscles of the thigh which control the knee become weakened and shrink (atrophy). Knee exercises are designed to build up the tone and strength of the thigh muscles and to improve knee motion. Often times heat used for twenty to thirty minutes before working out will loosen up your tissues and help with improving the range of motion but do not use heat for the first two weeks following  surgery. These exercises can be done on a training (exercise) mat, on the floor, on a table or on a bed. Use what ever works the best and is most comfortable for you Knee exercises include:  °Leg Lifts - While your knee is still immobilized in a splint or cast, you can do straight leg raises. Lift the leg to 60 degrees, hold for 3 sec, and slowly lower the leg. Repeat 10-20 times 2-3 times daily. Perform this exercise against resistance later as your knee gets better.  °Quad and Hamstring Sets - Tighten up the muscle on the front of the thigh (Quad) and hold for 5-10 sec. Repeat this 10-20 times hourly. Hamstring sets are done by pushing the foot backward against an object and holding for 5-10 sec. Repeat as with quad sets.  °A rehabilitation program following serious knee injuries can speed recovery and prevent re-injury in the future due to weakened muscles. Contact your doctor or a physical therapist for more information on knee rehabilitation.  ° °SKILLED REHAB INSTRUCTIONS: °If the patient is transferred to a skilled rehab facility following release from the hospital, a list of the current medications will be sent to the facility for the patient to continue.  When discharged from the skilled rehab facility, please have the facility set up the patient's Home Health Physical Therapy prior to being released. Also, the skilled facility will be responsible for providing the patient with their medications at time of release from the facility to include their pain medication, the muscle relaxants, and their blood thinner medication. If the patient is still at the rehab facility at time of the two week follow up appointment, the skilled rehab facility will also need to assist the patient in arranging follow up appointment in our office and any transportation needs. ° °MAKE SURE YOU:  °Understand these instructions.  °Will watch your condition.  °Will get help right away if you are not doing well or get worse.   ° ° °Pick up stool softner and laxative for home use following surgery while on pain medications. °Do NOT remove your dressing. You may shower.  °Do not take tub baths or submerge incision under water. °May shower starting three days after surgery. °Please use a clean towel to pat the incision dry following showers. °Continue to use ice for pain and swelling after surgery. °Do not use any lotions or creams on the incision until instructed by your surgeon. ° °

## 2017-05-29 ENCOUNTER — Encounter (HOSPITAL_COMMUNITY): Payer: Self-pay | Admitting: Orthopedic Surgery

## 2017-05-29 LAB — GLUCOSE, CAPILLARY
GLUCOSE-CAPILLARY: 201 mg/dL — AB (ref 65–99)
Glucose-Capillary: 119 mg/dL — ABNORMAL HIGH (ref 65–99)
Glucose-Capillary: 104 mg/dL — ABNORMAL HIGH (ref 65–99)
Glucose-Capillary: 171 mg/dL — ABNORMAL HIGH (ref 65–99)

## 2017-05-29 LAB — BASIC METABOLIC PANEL
ANION GAP: 9 (ref 5–15)
BUN: 5 mg/dL — ABNORMAL LOW (ref 6–20)
CALCIUM: 8.7 mg/dL — AB (ref 8.9–10.3)
CO2: 30 mmol/L (ref 22–32)
Chloride: 101 mmol/L (ref 101–111)
Creatinine, Ser: 0.54 mg/dL (ref 0.44–1.00)
Glucose, Bld: 101 mg/dL — ABNORMAL HIGH (ref 65–99)
POTASSIUM: 3.5 mmol/L (ref 3.5–5.1)
SODIUM: 140 mmol/L (ref 135–145)

## 2017-05-29 LAB — CBC
HCT: 35.2 % — ABNORMAL LOW (ref 36.0–46.0)
Hemoglobin: 10.8 g/dL — ABNORMAL LOW (ref 12.0–15.0)
MCH: 24.9 pg — ABNORMAL LOW (ref 26.0–34.0)
MCHC: 30.7 g/dL (ref 30.0–36.0)
MCV: 81.3 fL (ref 78.0–100.0)
PLATELETS: 139 10*3/uL — AB (ref 150–400)
RBC: 4.33 MIL/uL (ref 3.87–5.11)
RDW: 15.2 % (ref 11.5–15.5)
WBC: 9.1 10*3/uL (ref 4.0–10.5)

## 2017-05-29 MED ORDER — SENNA 8.6 MG PO TABS: 2.0000 | ORAL_TABLET | Freq: Every day | ORAL | 0 refills | Status: DC

## 2017-05-29 MED ORDER — DOCUSATE SODIUM 100 MG PO CAPS: 100.0000 mg | ORAL_CAPSULE | Freq: Two times a day (BID) | ORAL | 1 refills | Status: DC

## 2017-05-29 MED ORDER — HYDROCODONE-ACETAMINOPHEN 5-325 MG PO TABS: 1.0000 | ORAL_TABLET | Freq: Four times a day (QID) | ORAL | 0 refills | Status: DC | PRN

## 2017-05-29 MED ORDER — ASPIRIN 81 MG PO CHEW: 81.0000 mg | CHEWABLE_TABLET | Freq: Two times a day (BID) | ORAL | 1 refills | Status: DC

## 2017-05-29 MED ORDER — ONDANSETRON HCL 4 MG PO TABS: 4.0000 mg | ORAL_TABLET | Freq: Four times a day (QID) | ORAL | 0 refills | Status: DC | PRN

## 2017-05-29 NOTE — Discharge Summary (Signed)
Physician Discharge Summary  Patient ID: Tracy Bonilla MRN: [id number] DOB/AGE: 65-May-1952 65 y.o.  Admit date: 05/28/2017 Discharge date: 05/30/2017  Admission Diagnoses:  Osteoarthritis of right knee  Discharge Diagnoses:  Principal Problem:   Osteoarthritis of right knee   Past Medical History:  Diagnosis Date  . Arthritis   . Complication of anesthesia   . Diabetes mellitus without complication (HCC)   . Hyperlipidemia   . Hypertension   . PONV (postoperative nausea and vomiting)     Surgeries: Procedure(s): COMPUTER ASSISTED TOTAL KNEE ARTHROPLASTY on 05/28/2017   Consultants (if any):   Discharged Condition: Improved  Hospital Course: Tracy Bonilla is an 66 y.o. female who was admitted 05/28/2017 with a diagnosis of Osteoarthritis of right knee and went to the operating room on 05/28/2017 and underwent the above named procedures.    She was given perioperative antibiotics:  Anti-infectives    Start     Dose/Rate Route Frequency Ordered Stop   05/28/17 1800  ceFAZolin (ANCEF) IVPB 2g/100 mL premix     2 g 200 mL/hr over 30 Minutes Intravenous Every 6 hours 05/28/17 1454 05/28/17 2346   05/28/17 1230  ceFAZolin (ANCEF) IVPB 2g/100 mL premix     2 g 200 mL/hr over 30 Minutes Intravenous To Short Stay 05/25/17 1304 05/28/17 1218    .  She was given sequential compression devices, early ambulation, and ASA for DVT prophylaxis.  She benefited maximally from the hospital stay and there were no complications.    Recent vital signs:  Vitals:   05/29/17 2009 05/30/17 0500  BP: (!) 115/57 (!) 118/40  Pulse: 74 64  Resp: 17 16  Temp: 98.9 F (37.2 C) 97.8 F (36.6 C)  SpO2: 97% 96%    Recent laboratory studies:  Lab Results  Component Value Date   HGB 11.5 (L) 05/30/2017   HGB 10.8 (L) 05/29/2017   HGB 13.2 05/17/2017   Lab Results  Component Value Date   WBC 10.5 05/30/2017   PLT 180 05/30/2017   Lab Results  Component Value Date   INR 0.96 05/06/2015    Lab Results  Component Value Date   NA 140 05/29/2017   K 3.5 05/29/2017   CL 101 05/29/2017   CO2 30 05/29/2017   BUN 5 (L) 05/29/2017   CREATININE 0.54 05/29/2017   GLUCOSE 101 (H) 05/29/2017    Discharge Medications:   Allergies as of 05/30/2017   No Known Allergies     Medication List    STOP taking these medications   acetaminophen 325 MG tablet Commonly known as:  TYLENOL   LINIMENTS EX     TAKE these medications   ALLEGRA-D 12 HOUR PO Take 1 tablet by mouth daily as needed (for allergies).   aspirin 81 MG chewable tablet Chew 1 tablet (81 mg total) by mouth 2 (two) times daily.   atorvastatin 20 MG tablet Commonly known as:  LIPITOR Take 20 mg by mouth at bedtime.   BIOFREEZE EX Apply 1-2 application topically 3 (three) times daily as needed (for knee pain.(DOES NOT USE WITH HORSE LINIMENT)). ROLL ON FORMULATION   CITRUCEL 500 MG Tabs Generic drug:  Methylcellulose (Laxative) Take 500 mg by mouth at bedtime.   dimenhyDRINATE 50 MG tablet Commonly known as:  DRAMAMINE Take 25 mg by mouth every 8 (eight) hours as needed for dizziness.   docusate sodium 100 MG capsule Commonly known as:  COLACE Take 1 capsule (100 mg total) by mouth 2 (two) times  daily.   EARACHE DROPS OT Place 1-2 drops in ear(s) daily as needed (for sinus/ear pain.).   fluticasone 50 MCG/ACT nasal spray Commonly known as:  FLONASE Place 1 spray into both nostrils at bedtime.   hydrochlorothiazide 25 MG tablet Commonly known as:  HYDRODIURIL Take 25 mg by mouth daily with lunch.   HYDROcodone-acetaminophen 5-325 MG tablet Commonly known as:  NORCO/VICODIN Take 1-2 tablets by mouth every 6 (six) hours as needed (breakthrough pain).   INVOKANA 100 MG Tabs tablet Generic drug:  canagliflozin Take 100 mg by mouth daily.   losartan 50 MG tablet Commonly known as:  COZAAR Take 50 mg by mouth 2 (two) times daily.   metFORMIN 500 MG 24 hr tablet Commonly known as:   GLUCOPHAGE-XR Take 500 mg by mouth 2 (two) times daily.   nabumetone 500 MG tablet Commonly known as:  RELAFEN Take 500 mg by mouth 2 (two) times daily.   ondansetron 4 MG tablet Commonly known as:  ZOFRAN Take 1 tablet (4 mg total) by mouth every 6 (six) hours as needed for nausea.   OSCAL 500/200 D-3 500-200 MG-UNIT tablet Generic drug:  calcium-vitamin D Take 1 tablet by mouth daily with lunch.   potassium chloride 10 MEQ tablet Commonly known as:  K-DUR,KLOR-CON Take 10 mEq by mouth daily.   senna 8.6 MG Tabs tablet Commonly known as:  SENOKOT Take 2 tablets (17.2 mg total) by mouth at bedtime.   VISINE EXTRA OP Place 1-2 drops into both eyes 3 (three) times daily as needed (dry eyes.).       Diagnostic Studies: Dg Knee Right Port  Result Date: 05/28/2017 CLINICAL DATA:  Status post right total knee replacement today. EXAM: PORTABLE RIGHT KNEE - 1-2 VIEW COMPARISON:  None. FINDINGS: Right total knee arthroplasty is in place. The device is located. No fracture is identified. Gas in the soft tissues from surgery noted. IMPRESSION: Right total knee arthrosis present status post right total knee replacement. No acute abnormality. Electronically Signed   By: Drusilla Kanner M.D.   On: 05/28/2017 15:49    Disposition: 01-Home or Self Care  Discharge Instructions    Call MD / Call 911    Complete by:  As directed    If you experience chest pain or shortness of breath, CALL 911 and be transported to the hospital emergency room.  If you develope a fever above 101 F, pus (white drainage) or increased drainage or redness at the wound, or calf pain, call your surgeon's office.   Constipation Prevention    Complete by:  As directed    Drink plenty of fluids.  Prune juice may be helpful.  You may use a stool softener, such as Colace (over the counter) 100 mg twice a day.  Use MiraLax (over the counter) for constipation as needed.   Diet - low sodium heart healthy    Complete by:  As  directed    Do not put a pillow under the knee. Place it under the heel.    Complete by:  As directed    Driving restrictions    Complete by:  As directed    No driving for 6 weeks   Increase activity slowly as tolerated    Complete by:  As directed    Lifting restrictions    Complete by:  As directed    No lifting for 6 weeks   TED hose    Complete by:  As directed    Use stockings (  TED hose) for 2 weeks on both leg(s).  You may remove them at night for sleeping.      Follow-up Information    Jaiel Saraceno, Arlys John, MD. Schedule an appointment as soon as possible for a visit in 2 week(s).   Specialty:  Orthopedic Surgery Why:  For wound re-check Contact information: [address] [place] [address] [phone]        [facility] Follow up.   Specialty:  Home Health Services Why:  A representative from [facility] will contact you to arrange start date and time for your therapy. Contact information: [address] [place] [address] [phone]            Signed: Garnet Koyanagi 05/30/2017, 4:34 PM

## 2017-05-29 NOTE — Progress Notes (Signed)
OT Note - Addendum    05/29/17 1642  OT Visit Information  Last OT Received On 05/29/17  OT Time Calculation  OT Start Time (ACUTE ONLY) 1537  OT Stop Time (ACUTE ONLY) 1558  OT Time Calculation (min) 21 min  OT General Charges  $OT Visit 1 Visit  OT Evaluation  $OT Eval Low Complexity 1 Low  [facility], OT/L  [phone] 05/29/2017

## 2017-05-29 NOTE — Therapy (Signed)
Occupational Therapy Evaluation Patient Details Name: Tracy Bonilla MRN: [id number] DOB: 04-25-1951 Today's Date: 05/29/2017    History of Present Illness Pt is a 66 y/o female s/p elective R TKA on 05/28/17. Pertinent PMH includes HTN, DM, arthritis, and L TKA (~2 years ago).    Clinical Impression   Pt reports being independent with ADLs PTA. Currently, pt requires min assist for LB ADLs, min guard for functional mobility at RW level and set up/supervision for all other ADLs. Pt reports husband is available to provide assistance as needed upon d/c home. Pt would benefit from acute OT services to increase independence with functional mobility and ADLs. OT will follow acutely to address established goals.     Follow Up Recommendations  No OT follow up;Supervision - Intermittent    Equipment Recommendations  None recommended by OT (Pt has all needed DME)    Recommendations for Other Services       Precautions / Restrictions Precautions Precautions: Knee Precaution Comments: Verbally reviewed precautions Restrictions Weight Bearing Restrictions: Yes RLE Weight Bearing: Weight bearing as tolerated      Mobility Bed Mobility Overal bed mobility: Needs Assistance Bed Mobility: Supine to Sit;Sit to Supine     Supine to sit: Modified independent (Device/Increase time) Sit to supine: Modified independent (Device/Increase time)      Transfers Overall transfer level: Needs assistance Equipment used: Rolling walker (2 wheeled) Transfers: Sit to/from Stand Sit to Stand: Min guard         General transfer comment:    Balance Overall balance assessment: Needs assistance Sitting-balance support: No upper extremity supported;Feet supported Sitting balance-Leahy Scale: Good     Standing balance support: Bilateral upper extremity supported;Single extremity supported Standing balance-Leahy Scale: Fair Standing balance comment: Reliant on UE support during functional mobility                             ADL either performed or assessed with clinical judgement   ADL Overall ADL's : Needs assistance/impaired     Grooming: Supervision/safety;Set up;Sitting   Upper Body Bathing: Supervision/ safety;Set up;Sitting   Lower Body Bathing: Minimal assistance;Sitting/lateral leans   Upper Body Dressing : Supervision/safety;Set up;Sitting   Lower Body Dressing: Minimal assistance;Sit to/from stand   Toilet Transfer: Min guard;Ambulation;RW;Comfort height toilet Toilet Transfer Details (indicate cue type and reason): Min guard for safety and management of lines  Toileting- Clothing Manipulation and Hygiene: Supervision/safety;Sit to/from Nurse, children's Details (indicate cue type and reason): Pt has a walk in shower with small step and a built in shower seat. Plan to practice shower transfer technique tomorrow.  Functional mobility during ADLs: Min guard;Rolling walker       Vision         Perception     Praxis      Pertinent Vitals/Pain Pain Assessment: No/denies pain Faces Pain Scale: Hurts a little bit Pain Location: R knee  Pain Descriptors / Indicators: Aching;Operative site guarding Pain Intervention(s): Monitored during session;Ice applied     Hand Dominance Left   Extremity/Trunk Assessment Upper Extremity Assessment Upper Extremity Assessment: Overall WFL for tasks assessed   Lower Extremity Assessment Lower Extremity Assessment: Defer to PT evaluation   Cervical / Trunk Assessment Cervical / Trunk Assessment: Normal   Communication Communication Communication: No difficulties   Cognition Arousal/Alertness: Awake/alert Behavior During Therapy: WFL for tasks assessed/performed Overall Cognitive Status: Within Functional Limits for tasks assessed  General Comments: Pt very talkative, requiring intermittent cues to stay on task and cues for safety   General  Comments  Pt reports this is her second knee surgery and is aware of the precautions. Pt reports her husband will be able to assist as needed at home.    Exercises     Shoulder Instructions      Home Living Family/patient expects to be discharged to:: Private residence Living Arrangements: Spouse/significant other Available Help at Discharge: Family;Available 24 hours/day Type of Home: House Home Access: Stairs to enter [employer] of Steps: 3 Entrance Stairs-Rails: Right;Left;Can reach both Home Layout: One level     Bathroom Shower/Tub: Producer, television/film/video: Handicapped height Bathroom Accessibility: Yes How Accessible: Accessible via walker Home Equipment: Walker - 2 wheels;Bedside commode;Cane - single point;Shower seat - built in          Prior Functioning/Environment Level of Independence: Independent        Comments: Pt enjoys gardening and traveling with her husband. She is also very active in her church.         OT Problem List: Decreased knowledge of precautions;Decreased knowledge of use of DME or AE;Decreased activity tolerance      OT Treatment/Interventions: Self-care/ADL training;DME and/or AE instruction;Therapeutic activities;Patient/family education;Balance training    OT Goals(Current goals can be found in the care plan section) Acute Rehab OT Goals Patient Stated Goal: to feel better  OT Goal Formulation: With patient Time For Goal Achievement: 06/12/17 Potential to Achieve Goals: Good ADL Goals Pt Will Perform Lower Body Bathing: with supervision;sit to/from stand (AE as needed ) Pt Will Perform Lower Body Dressing: with supervision;sit to/from stand (AE as needed) Pt Will Perform Tub/Shower Transfer: Shower transfer;with supervision;ambulating;shower seat;rolling walker  OT Frequency: Min 2X/week   Barriers to D/C:            Co-evaluation              AM-PAC PT "6 Clicks" Daily Activity     Outcome  Measure Help from another person eating meals?: None Help from another person taking care of personal grooming?: None Help from another person toileting, which includes using toliet, bedpan, or urinal?: None Help from another person bathing (including washing, rinsing, drying)?: A Little Help from another person to put on and taking off regular upper body clothing?: None Help from another person to put on and taking off regular lower body clothing?: A Little 6 Click Score: 22   End of Session Equipment Utilized During Treatment: Gait belt;Rolling walker CPM Right Knee CPM Right Knee: Off Nurse Communication: Mobility status  Activity Tolerance: Patient tolerated treatment well Patient left: in bed;with call bell/phone within reach  OT Visit Diagnosis: Other abnormalities of gait and mobility (R26.89)                Time: 1610-9604 OT Time Calculation (min): 21 min Charges:    G-Codes:     Cammy Copa, OTS [phone]   Cammy Copa 05/29/2017, 4:10 PM

## 2017-05-29 NOTE — Progress Notes (Signed)
Physical Therapy Treatment Patient Details Name: Tracy Bonilla MRN: [id number] DOB: [birth year] Today's Date: 05/29/2017    History of Present Illness Pt is a 66 y/o female s/p elective R TKA on 05/28/17. Pertinent PMH includes HTN, DM, arthritis, and L TKA (~2 years ago).    PT Comments    Pt much improved this treatment session, able to increase amb distance and work on improved gait mechanics with RW and supervision; multiple standing rest breaks secondary to fatigue. Requires intermittent cues to stay on task and for safety with RW. Will plan for stair training tomorrow morning.    Follow Up Recommendations  DC plan and follow up therapy as arranged by surgeon;Supervision/Assistance - 24 hour     Equipment Recommendations  None recommended by PT (owns DME)    Recommendations for Other Services       Precautions / Restrictions Precautions Precautions: Knee Precaution Comments: Verbally reviewed precautions Restrictions Weight Bearing Restrictions: Yes RLE Weight Bearing: Weight bearing as tolerated    Mobility  Bed Mobility Overal bed mobility: Needs Assistance Bed Mobility: Supine to Sit;Sit to Supine     Supine to sit: Modified independent (Device/Increase time) Sit to supine: Modified independent (Device/Increase time)      Transfers Overall transfer level: Needs assistance Equipment used: Rolling walker (2 wheeled) Transfers: Sit to/from Stand Sit to Stand: Supervision         General transfer comment: Stood x2 from bed and toilet; mod indep for pericare with single UE support. Intermittent cues to keep RW closer to body for all transfers; cues for hand placement  Ambulation/Gait Ambulation/Gait assistance: Supervision Ambulation Distance (Feet): 120 Feet Assistive device: Rolling walker (2 wheeled) Gait Pattern/deviations: Step-through pattern;Decreased weight shift to right;Antalgic;Trunk flexed Gait velocity: Decreased Gait velocity interpretation: <1.8  ft/sec, indicative of risk for recurrent falls General Gait Details: Slow, antalgic gait with RW and supervision for safety. Intermittent cues for step-through and heel-to-toe gait pattern - pt has good technique, but gets easily distracted when talking and loses form. Multiple standing rest breaks secondary to fatigue   Stairs            Wheelchair Mobility    Modified Rankin (Stroke Patients Only)       Balance Overall balance assessment: Needs assistance Sitting-balance support: No upper extremity supported;Feet supported Sitting balance-Leahy Scale: Good     Standing balance support: Bilateral upper extremity supported;Single extremity supported   Standing balance comment: Reliant on UE support                            Cognition Arousal/Alertness: Awake/alert Behavior During Therapy: WFL for tasks assessed/performed Overall Cognitive Status: Within Functional Limits for tasks assessed                                 General Comments: Pt very talkative, requiring intermittent cues to stay on task and cues for safety      Exercises      General Comments        Pertinent Vitals/Pain Pain Assessment: Faces Faces Pain Scale: Hurts a little bit Pain Location: R knee  Pain Descriptors / Indicators: Aching;Operative site guarding Pain Intervention(s): Monitored during session;Ice applied    Home Living Family/patient expects to be discharged to:: Private residence Living Arrangements: Spouse/significant other  Prior Function            PT Goals (current goals can now be found in the care plan section) Acute Rehab PT Goals Patient Stated Goal: to feel better  PT Goal Formulation: With patient Time For Goal Achievement: 06/04/17 Potential to Achieve Goals: Good Progress towards PT goals: Progressing toward goals    Frequency    7X/week      PT Plan      Co-evaluation              AM-PAC  PT "6 Clicks" Daily Activity  Outcome Measure  Difficulty turning over in bed (including adjusting bedclothes, sheets and blankets)?: None Difficulty moving from lying on back to sitting on the side of the bed? : None Difficulty sitting down on and standing up from a chair with arms (e.g., wheelchair, bedside commode, etc,.)?: A Little Help needed moving to and from a bed to chair (including a wheelchair)?: A Little Help needed walking in hospital room?: A Little Help needed climbing 3-5 steps with a railing? : A Lot 6 Click Score: 19    End of Session Equipment Utilized During Treatment: Gait belt Activity Tolerance: Patient tolerated treatment well;Patient limited by fatigue Patient left: in bed;with call bell/phone within reach;with family/visitor present Nurse Communication: Mobility status PT Visit Diagnosis: Other abnormalities of gait and mobility (R26.89);Pain Pain - Right/Left: Right Pain - part of body: Knee     Time: 1342-1410 PT Time Calculation (min) (ACUTE ONLY): 28 min  Charges:  $Gait Training: 23-37 mins                    G Codes:      Ina Homes, PT, DPT Acute Rehab Services  Pager: [phone]  Malachy Chamber 05/29/2017, 2:24 PM

## 2017-05-29 NOTE — Progress Notes (Signed)
   Subjective:  Patient reports pain as mild to moderate.  Denies N/V/CP/SOB.  Objective:   VITALS:   Vitals:   05/28/17 1535 05/28/17 1550 05/28/17 2120 05/29/17 0500  BP: (!) 147/70 139/64 127/60 (!) 124/59  Pulse: 75 67 72 67  Resp: Temp:  97.6 F (36.4 C) 97.6 F (36.4 C) 98.7 F (37.1 C)  TempSrc:   Axillary Oral  SpO2: 96% 94% 94% 100%  Weight:        NAD ABD soft Sensation intact distally Intact pulses distally Dorsiflexion/Plantar flexion intact Incision: dressing C/D/I Compartment soft   Lab Results  Component Value Date   WBC 9.1 05/29/2017   HGB 10.8 (L) 05/29/2017   HCT 35.2 (L) 05/29/2017   MCV 81.3 05/29/2017   PLT 139 (L) 05/29/2017   BMET    Component Value Date/Time   NA 140 05/29/2017 0453   K 3.5 05/29/2017 0453   CL 101 05/29/2017 0453   CO2 30 05/29/2017 0453   GLUCOSE 101 (H) 05/29/2017 0453   BUN 5 (L) 05/29/2017 0453   CREATININE 0.54 05/29/2017 0453   CALCIUM 8.7 (L) 05/29/2017 0453   GFRNONAA >60 05/29/2017 0453   GFRAA >60 05/29/2017 0453     Assessment/Plan: 1 Day Post-Op   Principal Problem:   Osteoarthritis of right knee   WBAT with walker DCT ppx: ASA, SCDs, TEDs PO pain control PT/OT Dispo: D/C home tomorrow with HHPT   Jaydrian Corpening, Cloyde Reams 05/29/2017, 8:02 AM   Samson Frederic, MD Cell [phone]

## 2017-05-29 NOTE — Progress Notes (Signed)
Physical Therapy Treatment Patient Details Name: Tracy Bonilla MRN: [id number] DOB: 07/05/1951 Today's Date: 05/29/2017    History of Present Illness Pt is a 66 y/o female s/p elective R TKA on 05/28/17. Pertinent PMH includes HTN, DM, arthritis, and L TKA (~2 years ago).    PT Comments    Pt slowly progressing with mobility. Able to amb in room with RW and min guard, requiring 2x seated rest breaks secondary to c/o pain and nausea; pt potentially self-limiting due to anxiety. Max education and encouragement regarding importance of mobility. Pt reports understanding of this, but still declining further amb this morning or sitting up in chair. Educ on plan for continued gait and stair training during this afternoon's treatment session.    Follow Up Recommendations  DC plan and follow up therapy as arranged by surgeon;Supervision/Assistance - 24 hour     Equipment Recommendations  None recommended by PT (owns DME)    Recommendations for Other Services       Precautions / Restrictions Precautions Precautions: Knee Precaution Comments: Verbally reviewed precautions Restrictions Weight Bearing Restrictions: Yes RLE Weight Bearing: Weight bearing as tolerated    Mobility  Bed Mobility Overal bed mobility: Needs Assistance Bed Mobility: Supine to Sit;Sit to Supine     Supine to sit: Supervision;HOB elevated Sit to supine: Supervision;HOB elevated      Transfers Overall transfer level: Needs assistance Equipment used: Rolling walker (2 wheeled) Transfers: Sit to/from Stand Sit to Stand: Min guard         General transfer comment: Able to stand on 2nd attempt with cues for BLE placement, with RW and min guard for balance. Cues for hand placement with RW  Ambulation/Gait Ambulation/Gait assistance: Min guard Ambulation Distance (Feet): 30 Feet Assistive device: Rolling walker (2 wheeled) Gait Pattern/deviations: Step-to pattern;Step-through pattern;Decreased stride  length;Antalgic;Decreased weight shift to right Gait velocity: Decreased Gait velocity interpretation: <1.8 ft/sec, indicative of risk for recurrent falls General Gait Details: Amb 15' in room with RW and min guard for balance, with pt requiring seated rest break secondary to nausea. After seated EOB therex, pt willing to amb again, but again requiring seated rest break secondary to pain and nausea. Pt unwilling to amb further despite max encouragement; potential self-limiting   Stairs            Wheelchair Mobility    Modified Rankin (Stroke Patients Only)       Balance Overall balance assessment: Needs assistance Sitting-balance support: No upper extremity supported;Feet supported Sitting balance-Leahy Scale: Fair     Standing balance support: Bilateral upper extremity supported Standing balance-Leahy Scale: Poor Standing balance comment: Reliant on UE support                            Cognition Arousal/Alertness: Awake/alert Behavior During Therapy: WFL for tasks assessed/performed Overall Cognitive Status: Within Functional Limits for tasks assessed                                        Exercises Total Joint Exercises Long Arc Quad: AROM;Right;10 reps;Seated Knee Flexion: AAROM;Right;10 reps;Seated    General Comments General comments (skin integrity, edema, etc.): Husband present throughout session      Pertinent Vitals/Pain Pain Assessment: 0-10 Pain Score: 3  Pain Location: R knee  Pain Descriptors / Indicators: Aching;Operative site guarding Pain Intervention(s): Monitored during session;Ice applied  Home Living                      Prior Function            PT Goals (current goals can now be found in the care plan section) Acute Rehab PT Goals Patient Stated Goal: to feel better  PT Goal Formulation: With patient Time For Goal Achievement: 06/04/17 Potential to Achieve Goals: Good Progress towards PT  goals: Progressing toward goals    Frequency    7X/week      PT Plan      Co-evaluation              AM-PAC PT "6 Clicks" Daily Activity  Outcome Measure  Difficulty turning over in bed (including adjusting bedclothes, sheets and blankets)?: None Difficulty moving from lying on back to sitting on the side of the bed? : None Difficulty sitting down on and standing up from a chair with arms (e.g., wheelchair, bedside commode, etc,.)?: A Little Help needed moving to and from a bed to chair (including a wheelchair)?: A Little Help needed walking in hospital room?: A Little Help needed climbing 3-5 steps with a railing? : A Lot 6 Click Score: 19    End of Session Equipment Utilized During Treatment: Gait belt Activity Tolerance: Patient limited by pain Patient left: in bed;with call bell/phone within reach;with family/visitor present Nurse Communication: Mobility status PT Visit Diagnosis: Other abnormalities of gait and mobility (R26.89);Pain Pain - Right/Left: Right Pain - part of body: Knee     Time: 4098-1191 PT Time Calculation (min) (ACUTE ONLY): 23 min  Charges:  $Gait Training: 8-22 mins $Therapeutic Exercise: 8-22 mins                    G Codes:      Ina Homes, PT, DPT Acute Rehab Services  Pager: [phone]  Malachy Chamber 05/29/2017, 9:28 AM

## 2017-05-30 LAB — CBC
HEMATOCRIT: 37.6 % (ref 36.0–46.0)
HEMOGLOBIN: 11.5 g/dL — AB (ref 12.0–15.0)
MCH: 25.1 pg — ABNORMAL LOW (ref 26.0–34.0)
MCHC: 30.6 g/dL (ref 30.0–36.0)
MCV: 82.1 fL (ref 78.0–100.0)
Platelets: 180 10*3/uL (ref 150–400)
RBC: 4.58 MIL/uL (ref 3.87–5.11)
RDW: 15.4 % (ref 11.5–15.5)
WBC: 10.5 10*3/uL (ref 4.0–10.5)

## 2017-05-30 LAB — GLUCOSE, CAPILLARY
GLUCOSE-CAPILLARY: 115 mg/dL — AB (ref 65–99)
Glucose-Capillary: 150 mg/dL — ABNORMAL HIGH (ref 65–99)

## 2017-05-30 NOTE — Progress Notes (Signed)
Removed IV, provided discharge education/instructions, all questions and concerns addressed, Pt not in distress, discharged home accompanied by husband with all Pt's belongings.

## 2017-05-30 NOTE — Progress Notes (Signed)
   Subjective:  Patient reports pain as mild to moderate.  Denies N/V/CP/SOB.  Objective:   VITALS:   Vitals:   05/29/17 0500 05/29/17 1446 05/29/17 2009 05/30/17 0500  BP: (!) 124/59 110/61 (!) 115/57 (!) 118/40  Pulse: 67 77 74 64  Resp: Temp: 98.7 F (37.1 C) 98.1 F (36.7 C) 98.9 F (37.2 C) 97.8 F (36.6 C)  TempSrc: Oral Oral Oral Oral  SpO2: 100% 100% 97% 96%  Weight:        NAD ABD soft Sensation intact distally Intact pulses distally Dorsiflexion/Plantar flexion intact Incision: dressing C/D/I Compartment soft   Lab Results  Component Value Date   WBC 10.5 05/30/2017   HGB 11.5 (L) 05/30/2017   HCT 37.6 05/30/2017   MCV 82.1 05/30/2017   PLT 180 05/30/2017   BMET    Component Value Date/Time   NA 140 05/29/2017 0453   K 3.5 05/29/2017 0453   CL 101 05/29/2017 0453   CO2 30 05/29/2017 0453   GLUCOSE 101 (H) 05/29/2017 0453   BUN 5 (L) 05/29/2017 0453   CREATININE 0.54 05/29/2017 0453   CALCIUM 8.7 (L) 05/29/2017 0453   GFRNONAA >60 05/29/2017 0453   GFRAA >60 05/29/2017 0453     Assessment/Plan: 2 Days Post-Op   Principal Problem:   Osteoarthritis of right knee   WBAT with walker DCT ppx: ASA, SCDs, TEDs PO pain control PT/OT Dispo: D/C home with HHPT   Sulema Braid, Cloyde Reams 05/30/2017, 7:34 AM   Samson Frederic, MD Cell [phone]

## 2017-05-30 NOTE — Progress Notes (Signed)
Physical Therapy Treatment Patient Details Name: Tracy Bonilla MRN: [id number] DOB: [birth year] Today's Date: 05/30/2017    History of Present Illness Pt is a 66 y/o female s/p elective R TKA on 05/28/17. Pertinent PMH includes HTN, DM, arthritis, and L TKA (~2 years ago).    PT Comments    Pt has progressed well with mobility. Transfers and amb 200' mod indep with RW; supervision for steps with BUE support on rails (pt will have supervision from husband). Reviewed positioning, precautions, therex, fall risk reduction, and importance of continued mobility. Encouraged to continue amb in hallway with family or nurse tech. Pt has met short-term acute PT goals. All education completed and questions answered. D/c acute PT.   Follow Up Recommendations  DC plan and follow up therapy as arranged by surgeon;Supervision/Assistance - 24 hour     Equipment Recommendations  None recommended by PT (owns DME)    Recommendations for Other Services       Precautions / Restrictions Precautions Precautions: Knee Precaution Comments: Verbally reviewed precautions Restrictions Weight Bearing Restrictions: Yes RLE Weight Bearing: Weight bearing as tolerated    Mobility  Bed Mobility Overal bed mobility: Modified Independent Bed Mobility: Supine to Sit;Sit to Supine     Supine to sit: Modified independent (Device/Increase time) Sit to supine: Modified independent (Device/Increase time)      Transfers Overall transfer level: Modified independent Equipment used: Rolling walker (2 wheeled) Transfers: Sit to/from Stand Sit to Stand: Modified independent (Device/Increase time)            Ambulation/Gait Ambulation/Gait assistance: Modified independent (Device/Increase time) Ambulation Distance (Feet): 200 Feet Assistive device: Rolling walker (2 wheeled) Gait Pattern/deviations: Step-through pattern;Decreased weight shift to right Gait velocity: Decreased Gait velocity interpretation: <1.8  ft/sec, indicative of risk for recurrent falls     Stairs Stairs: Yes   Stair Management: Two rails;Step to pattern;Forwards Number of Stairs: 4 General stair comments: Ascend/descended 4 steps with BUE support on rails  Wheelchair Mobility    Modified Rankin (Stroke Patients Only)       Balance Overall balance assessment: Needs assistance Sitting-balance support: No upper extremity supported;Feet supported Sitting balance-Leahy Scale: Good     Standing balance support: No upper extremity supported;During functional activity;Bilateral upper extremity supported Standing balance-Leahy Scale: Fair Standing balance comment: Reliant on UE support during functional mobility                             Cognition Arousal/Alertness: Awake/alert Behavior During Therapy: WFL for tasks assessed/performed Overall Cognitive Status: Within Functional Limits for tasks assessed                                        Exercises      General Comments        Pertinent Vitals/Pain Pain Assessment: Faces Faces Pain Scale: Hurts a little bit Pain Location: R knee  Pain Descriptors / Indicators: Discomfort;Sore Pain Intervention(s): Monitored during session;Ice applied    Home Living                      Prior Function            PT Goals (current goals can now be found in the care plan section) Acute Rehab PT Goals Patient Stated Goal: Return home PT Goal Formulation: With patient Time For Goal Achievement:  06/04/17 Potential to Achieve Goals: Good Progress towards PT goals: Goals met/education completed, patient discharged from PT    Frequency    7X/week      PT Plan Current plan remains appropriate    Co-evaluation              AM-PAC PT "6 Clicks" Daily Activity  Outcome Measure  Difficulty turning over in bed (including adjusting bedclothes, sheets and blankets)?: None Difficulty moving from lying on back to sitting  on the side of the bed? : None Difficulty sitting down on and standing up from a chair with arms (e.g., wheelchair, bedside commode, etc,.)?: None Help needed moving to and from a bed to chair (including a wheelchair)?: None Help needed walking in hospital room?: None Help needed climbing 3-5 steps with a railing? : A Little 6 Click Score: 23    End of Session Equipment Utilized During Treatment: Gait belt Activity Tolerance: Patient tolerated treatment well Patient left: in bed;with call bell/phone within reach Nurse Communication: Mobility status PT Visit Diagnosis: Other abnormalities of gait and mobility (R26.89);Pain Pain - Right/Left: Right Pain - part of body: Knee     Time: 0086-7619 PT Time Calculation (min) (ACUTE ONLY): 19 min  Charges:  $Gait Training: 8-22 mins                    G Codes:      Mabeline Caras, PT, DPT Acute Rehab Services  Pager: [place] 05/30/2017, 7:59 AM

## 2017-05-30 NOTE — Therapy (Signed)
Occupational Therapy Treatment Patient Details Name: Tracy Bonilla MRN: [id number] DOB: Feb 01, 1951 Today's Date: 05/30/2017    History of present illness Pt is a 66 y/o female s/p elective R TKA on 05/28/17. Pertinent PMH includes HTN, DM, arthritis, and L TKA (~2 years ago).    OT comments  Focus of today's treatment session on walk in shower transfer. Pt educated on compensatory technique to complete walk in shower transfer with RW. Pt was able to return demonstration of transfer with supervision for safety. Pt advised to have assist from spouse initially upon d/c home. OT will continue to follow acutely to address established goals and increase independence with LB ADLs.     Follow Up Recommendations  No OT follow up;Supervision - Intermittent    Equipment Recommendations  None recommended by OT    Recommendations for Other Services      Precautions / Restrictions Precautions Precautions: Knee Precaution Booklet Issued: Yes (comment) Precaution Comments: Verbally reviewed precautions Restrictions Weight Bearing Restrictions: Yes RLE Weight Bearing: Weight bearing as tolerated       Mobility Bed Mobility Overal bed mobility: Modified Independent Bed Mobility: Supine to Sit;Sit to Supine     Supine to sit: Modified independent (Device/Increase time) Sit to supine: Modified independent (Device/Increase time)      Transfers Overall transfer level: Modified independent Equipment used: Rolling walker (2 wheeled) Transfers: Sit to/from Stand Sit to Stand: Modified independent (Device/Increase time)              Balance Overall balance assessment: Needs assistance Sitting-balance support: No upper extremity supported;Feet supported Sitting balance-Leahy Scale: Good     Standing balance support: Bilateral upper extremity supported;During functional activity Standing balance-Leahy Scale: Good Standing balance comment: Pt able to complete walk in shower transfer  with no LOB.                            ADL either performed or assessed with clinical judgement   ADL Overall ADL's : Needs assistance/impaired                       Lower Body Dressing Details (indicate cue type and reason): Pt educated on compensatory techniques for LB dressing. Pt declines education on AE as husband will be able to assist as needed.          Tub/ Shower Transfer: Walk-in shower;Min guard;Ambulation;3 in 1;Rolling walker;Grab bars Tub/Shower Transfer Details (indicate cue type and reason): Pt able to complete walk in shower transfer with min guard for safety and verbal cues for technique. Pt advised to have husband present to assist during transfer initially upon d/c.  Functional mobility during ADLs: Min guard;Rolling walker       Vision       Perception     Praxis      Cognition Arousal/Alertness: Awake/alert Behavior During Therapy: WFL for tasks assessed/performed Overall Cognitive Status: Within Functional Limits for tasks assessed                                          Exercises     Shoulder Instructions       General Comments Pt's husband present during session and participated in all education. Husband agreeable to assist with ADLs as needed.     Pertinent Vitals/ Pain  Pain Assessment: No/denies pain Faces Pain Scale: Hurts a little bit Pain Location: R knee  Pain Descriptors / Indicators: Discomfort;Sore Pain Intervention(s): Monitored during session;Ice applied  Home Living                                          Prior Functioning/Environment              Frequency  Min 2X/week        Progress Toward Goals  OT Goals(current goals can now be found in the care plan section)  Progress towards OT goals: Progressing toward goals  Acute Rehab OT Goals Patient Stated Goal: Return home OT Goal Formulation: With patient Time For Goal Achievement:  06/12/17 Potential to Achieve Goals: Good ADL Goals Pt Will Perform Lower Body Bathing: with supervision;sit to/from stand Pt Will Perform Lower Body Dressing: with supervision;sit to/from stand Pt Will Perform Tub/Shower Transfer: Shower transfer;with supervision;ambulating;shower seat;rolling walker  Plan Discharge plan remains appropriate    Co-evaluation                 AM-PAC PT "6 Clicks" Daily Activity     Outcome Measure   Help from another person eating meals?: None Help from another person taking care of personal grooming?: None Help from another person toileting, which includes using toliet, bedpan, or urinal?: None Help from another person bathing (including washing, rinsing, drying)?: A Little Help from another person to put on and taking off regular upper body clothing?: None Help from another person to put on and taking off regular lower body clothing?: A Little 6 Click Score: 22    End of Session Equipment Utilized During Treatment: Gait belt;Rolling walker CPM Right Knee CPM Right Knee: Off  OT Visit Diagnosis: Other abnormalities of gait and mobility (R26.89)   Activity Tolerance Patient tolerated treatment well   Patient Left in bed;with call bell/phone within reach;with family/visitor present   Nurse Communication Mobility status        Time: 1000-1018 OT Time Calculation (min): 18 min  Charges: OT General Charges $OT Visit: 1 Visit OT Treatments $Self Care/Home Management : 8-22 mins  Cammy Copa, [place] #[phone]    Cammy Copa 05/30/2017, 10:47 AM

## 2017-05-31 DIAGNOSIS — Z7984 Long term (current) use of oral hypoglycemic drugs: Secondary | ICD-10-CM | POA: Diagnosis not present

## 2017-05-31 DIAGNOSIS — Z471 Aftercare following joint replacement surgery: Secondary | ICD-10-CM | POA: Diagnosis not present

## 2017-05-31 DIAGNOSIS — I10 Essential (primary) hypertension: Secondary | ICD-10-CM | POA: Diagnosis not present

## 2017-05-31 DIAGNOSIS — Z96653 Presence of artificial knee joint, bilateral: Secondary | ICD-10-CM | POA: Diagnosis not present

## 2017-05-31 DIAGNOSIS — E119 Type 2 diabetes mellitus without complications: Secondary | ICD-10-CM | POA: Diagnosis not present

## 2017-06-01 DIAGNOSIS — E119 Type 2 diabetes mellitus without complications: Secondary | ICD-10-CM | POA: Diagnosis not present

## 2017-06-01 DIAGNOSIS — Z7984 Long term (current) use of oral hypoglycemic drugs: Secondary | ICD-10-CM | POA: Diagnosis not present

## 2017-06-01 DIAGNOSIS — Z96653 Presence of artificial knee joint, bilateral: Secondary | ICD-10-CM | POA: Diagnosis not present

## 2017-06-01 DIAGNOSIS — I10 Essential (primary) hypertension: Secondary | ICD-10-CM | POA: Diagnosis not present

## 2017-06-01 DIAGNOSIS — Z471 Aftercare following joint replacement surgery: Secondary | ICD-10-CM | POA: Diagnosis not present

## 2017-06-04 DIAGNOSIS — Z7984 Long term (current) use of oral hypoglycemic drugs: Secondary | ICD-10-CM | POA: Diagnosis not present

## 2017-06-04 DIAGNOSIS — Z471 Aftercare following joint replacement surgery: Secondary | ICD-10-CM | POA: Diagnosis not present

## 2017-06-04 DIAGNOSIS — Z96653 Presence of artificial knee joint, bilateral: Secondary | ICD-10-CM | POA: Diagnosis not present

## 2017-06-04 DIAGNOSIS — I10 Essential (primary) hypertension: Secondary | ICD-10-CM | POA: Diagnosis not present

## 2017-06-04 DIAGNOSIS — E119 Type 2 diabetes mellitus without complications: Secondary | ICD-10-CM | POA: Diagnosis not present

## 2017-06-06 DIAGNOSIS — I10 Essential (primary) hypertension: Secondary | ICD-10-CM | POA: Diagnosis not present

## 2017-06-06 DIAGNOSIS — Z96653 Presence of artificial knee joint, bilateral: Secondary | ICD-10-CM | POA: Diagnosis not present

## 2017-06-06 DIAGNOSIS — Z7984 Long term (current) use of oral hypoglycemic drugs: Secondary | ICD-10-CM | POA: Diagnosis not present

## 2017-06-06 DIAGNOSIS — E119 Type 2 diabetes mellitus without complications: Secondary | ICD-10-CM | POA: Diagnosis not present

## 2017-06-06 DIAGNOSIS — Z471 Aftercare following joint replacement surgery: Secondary | ICD-10-CM | POA: Diagnosis not present

## 2017-06-08 DIAGNOSIS — Z471 Aftercare following joint replacement surgery: Secondary | ICD-10-CM | POA: Diagnosis not present

## 2017-06-08 DIAGNOSIS — E119 Type 2 diabetes mellitus without complications: Secondary | ICD-10-CM | POA: Diagnosis not present

## 2017-06-08 DIAGNOSIS — Z96653 Presence of artificial knee joint, bilateral: Secondary | ICD-10-CM | POA: Diagnosis not present

## 2017-06-08 DIAGNOSIS — I10 Essential (primary) hypertension: Secondary | ICD-10-CM | POA: Diagnosis not present

## 2017-06-08 DIAGNOSIS — Z7984 Long term (current) use of oral hypoglycemic drugs: Secondary | ICD-10-CM | POA: Diagnosis not present

## 2017-06-12 DIAGNOSIS — Z96651 Presence of right artificial knee joint: Secondary | ICD-10-CM | POA: Diagnosis not present

## 2017-06-12 DIAGNOSIS — Z471 Aftercare following joint replacement surgery: Secondary | ICD-10-CM | POA: Diagnosis not present

## 2017-06-21 DIAGNOSIS — R2689 Other abnormalities of gait and mobility: Secondary | ICD-10-CM | POA: Diagnosis not present

## 2017-06-21 DIAGNOSIS — M25561 Pain in right knee: Secondary | ICD-10-CM | POA: Diagnosis not present

## 2017-06-21 DIAGNOSIS — M25661 Stiffness of right knee, not elsewhere classified: Secondary | ICD-10-CM | POA: Diagnosis not present

## 2017-06-21 DIAGNOSIS — M6281 Muscle weakness (generalized): Secondary | ICD-10-CM | POA: Diagnosis not present

## 2017-06-25 DIAGNOSIS — R2689 Other abnormalities of gait and mobility: Secondary | ICD-10-CM | POA: Diagnosis not present

## 2017-06-25 DIAGNOSIS — M25661 Stiffness of right knee, not elsewhere classified: Secondary | ICD-10-CM | POA: Diagnosis not present

## 2017-06-25 DIAGNOSIS — M6281 Muscle weakness (generalized): Secondary | ICD-10-CM | POA: Diagnosis not present

## 2017-06-25 DIAGNOSIS — M25561 Pain in right knee: Secondary | ICD-10-CM | POA: Diagnosis not present

## 2017-06-28 DIAGNOSIS — R2689 Other abnormalities of gait and mobility: Secondary | ICD-10-CM | POA: Diagnosis not present

## 2017-06-28 DIAGNOSIS — M25661 Stiffness of right knee, not elsewhere classified: Secondary | ICD-10-CM | POA: Diagnosis not present

## 2017-06-28 DIAGNOSIS — M6281 Muscle weakness (generalized): Secondary | ICD-10-CM | POA: Diagnosis not present

## 2017-06-28 DIAGNOSIS — M25561 Pain in right knee: Secondary | ICD-10-CM | POA: Diagnosis not present

## 2017-07-02 DIAGNOSIS — M25561 Pain in right knee: Secondary | ICD-10-CM | POA: Diagnosis not present

## 2017-07-02 DIAGNOSIS — M25661 Stiffness of right knee, not elsewhere classified: Secondary | ICD-10-CM | POA: Diagnosis not present

## 2017-07-02 DIAGNOSIS — R2689 Other abnormalities of gait and mobility: Secondary | ICD-10-CM | POA: Diagnosis not present

## 2017-07-02 DIAGNOSIS — M6281 Muscle weakness (generalized): Secondary | ICD-10-CM | POA: Diagnosis not present

## 2017-07-06 DIAGNOSIS — M6281 Muscle weakness (generalized): Secondary | ICD-10-CM | POA: Diagnosis not present

## 2017-07-06 DIAGNOSIS — M25561 Pain in right knee: Secondary | ICD-10-CM | POA: Diagnosis not present

## 2017-07-06 DIAGNOSIS — R2689 Other abnormalities of gait and mobility: Secondary | ICD-10-CM | POA: Diagnosis not present

## 2017-07-06 DIAGNOSIS — M25661 Stiffness of right knee, not elsewhere classified: Secondary | ICD-10-CM | POA: Diagnosis not present

## 2017-07-07 DIAGNOSIS — J0100 Acute maxillary sinusitis, unspecified: Secondary | ICD-10-CM | POA: Diagnosis not present

## 2017-07-07 DIAGNOSIS — Z6828 Body mass index (BMI) 28.0-28.9, adult: Secondary | ICD-10-CM | POA: Diagnosis not present

## 2017-07-08 DIAGNOSIS — M6281 Muscle weakness (generalized): Secondary | ICD-10-CM | POA: Diagnosis not present

## 2017-07-08 DIAGNOSIS — M25661 Stiffness of right knee, not elsewhere classified: Secondary | ICD-10-CM | POA: Diagnosis not present

## 2017-07-08 DIAGNOSIS — M25561 Pain in right knee: Secondary | ICD-10-CM | POA: Diagnosis not present

## 2017-07-08 DIAGNOSIS — R2689 Other abnormalities of gait and mobility: Secondary | ICD-10-CM | POA: Diagnosis not present

## 2017-07-09 DIAGNOSIS — M25561 Pain in right knee: Secondary | ICD-10-CM | POA: Diagnosis not present

## 2017-07-09 DIAGNOSIS — R2689 Other abnormalities of gait and mobility: Secondary | ICD-10-CM | POA: Diagnosis not present

## 2017-07-09 DIAGNOSIS — M25661 Stiffness of right knee, not elsewhere classified: Secondary | ICD-10-CM | POA: Diagnosis not present

## 2017-07-09 DIAGNOSIS — M6281 Muscle weakness (generalized): Secondary | ICD-10-CM | POA: Diagnosis not present

## 2017-07-11 DIAGNOSIS — M1711 Unilateral primary osteoarthritis, right knee: Secondary | ICD-10-CM | POA: Diagnosis not present

## 2017-07-11 DIAGNOSIS — Z471 Aftercare following joint replacement surgery: Secondary | ICD-10-CM | POA: Diagnosis not present

## 2017-07-11 DIAGNOSIS — Z96651 Presence of right artificial knee joint: Secondary | ICD-10-CM | POA: Diagnosis not present

## 2017-07-16 DIAGNOSIS — R2689 Other abnormalities of gait and mobility: Secondary | ICD-10-CM | POA: Diagnosis not present

## 2017-07-16 DIAGNOSIS — M25561 Pain in right knee: Secondary | ICD-10-CM | POA: Diagnosis not present

## 2017-07-16 DIAGNOSIS — M25661 Stiffness of right knee, not elsewhere classified: Secondary | ICD-10-CM | POA: Diagnosis not present

## 2017-07-16 DIAGNOSIS — M6281 Muscle weakness (generalized): Secondary | ICD-10-CM | POA: Diagnosis not present

## 2017-07-17 DIAGNOSIS — R2689 Other abnormalities of gait and mobility: Secondary | ICD-10-CM | POA: Diagnosis not present

## 2017-07-17 DIAGNOSIS — M6281 Muscle weakness (generalized): Secondary | ICD-10-CM | POA: Diagnosis not present

## 2017-07-17 DIAGNOSIS — M25661 Stiffness of right knee, not elsewhere classified: Secondary | ICD-10-CM | POA: Diagnosis not present

## 2017-07-17 DIAGNOSIS — M25561 Pain in right knee: Secondary | ICD-10-CM | POA: Diagnosis not present

## 2017-07-18 DIAGNOSIS — Z1389 Encounter for screening for other disorder: Secondary | ICD-10-CM | POA: Diagnosis not present

## 2017-07-18 DIAGNOSIS — E663 Overweight: Secondary | ICD-10-CM | POA: Diagnosis not present

## 2017-07-18 DIAGNOSIS — J069 Acute upper respiratory infection, unspecified: Secondary | ICD-10-CM | POA: Diagnosis not present

## 2017-07-18 DIAGNOSIS — Z6828 Body mass index (BMI) 28.0-28.9, adult: Secondary | ICD-10-CM | POA: Diagnosis not present

## 2017-07-19 DIAGNOSIS — R2689 Other abnormalities of gait and mobility: Secondary | ICD-10-CM | POA: Diagnosis not present

## 2017-07-19 DIAGNOSIS — M6281 Muscle weakness (generalized): Secondary | ICD-10-CM | POA: Diagnosis not present

## 2017-07-19 DIAGNOSIS — M25561 Pain in right knee: Secondary | ICD-10-CM | POA: Diagnosis not present

## 2017-07-19 DIAGNOSIS — M25661 Stiffness of right knee, not elsewhere classified: Secondary | ICD-10-CM | POA: Diagnosis not present

## 2017-07-23 DIAGNOSIS — I10 Essential (primary) hypertension: Secondary | ICD-10-CM | POA: Diagnosis not present

## 2017-07-23 DIAGNOSIS — Z6828 Body mass index (BMI) 28.0-28.9, adult: Secondary | ICD-10-CM | POA: Diagnosis not present

## 2017-07-23 DIAGNOSIS — E119 Type 2 diabetes mellitus without complications: Secondary | ICD-10-CM | POA: Diagnosis not present

## 2017-07-23 DIAGNOSIS — E663 Overweight: Secondary | ICD-10-CM | POA: Diagnosis not present

## 2017-07-24 DIAGNOSIS — M6281 Muscle weakness (generalized): Secondary | ICD-10-CM | POA: Diagnosis not present

## 2017-07-24 DIAGNOSIS — R2689 Other abnormalities of gait and mobility: Secondary | ICD-10-CM | POA: Diagnosis not present

## 2017-07-24 DIAGNOSIS — M25561 Pain in right knee: Secondary | ICD-10-CM | POA: Diagnosis not present

## 2017-07-24 DIAGNOSIS — M25661 Stiffness of right knee, not elsewhere classified: Secondary | ICD-10-CM | POA: Diagnosis not present

## 2017-07-26 DIAGNOSIS — M25561 Pain in right knee: Secondary | ICD-10-CM | POA: Diagnosis not present

## 2017-07-26 DIAGNOSIS — M6281 Muscle weakness (generalized): Secondary | ICD-10-CM | POA: Diagnosis not present

## 2017-07-26 DIAGNOSIS — R2689 Other abnormalities of gait and mobility: Secondary | ICD-10-CM | POA: Diagnosis not present

## 2017-07-26 DIAGNOSIS — M25661 Stiffness of right knee, not elsewhere classified: Secondary | ICD-10-CM | POA: Diagnosis not present

## 2017-08-02 DIAGNOSIS — R2689 Other abnormalities of gait and mobility: Secondary | ICD-10-CM | POA: Diagnosis not present

## 2017-08-02 DIAGNOSIS — M25661 Stiffness of right knee, not elsewhere classified: Secondary | ICD-10-CM | POA: Diagnosis not present

## 2017-08-02 DIAGNOSIS — M25561 Pain in right knee: Secondary | ICD-10-CM | POA: Diagnosis not present

## 2017-08-02 DIAGNOSIS — M6281 Muscle weakness (generalized): Secondary | ICD-10-CM | POA: Diagnosis not present

## 2017-08-03 DIAGNOSIS — R2689 Other abnormalities of gait and mobility: Secondary | ICD-10-CM | POA: Diagnosis not present

## 2017-08-03 DIAGNOSIS — M6281 Muscle weakness (generalized): Secondary | ICD-10-CM | POA: Diagnosis not present

## 2017-08-03 DIAGNOSIS — M25661 Stiffness of right knee, not elsewhere classified: Secondary | ICD-10-CM | POA: Diagnosis not present

## 2017-08-03 DIAGNOSIS — M25561 Pain in right knee: Secondary | ICD-10-CM | POA: Diagnosis not present

## 2017-08-07 DIAGNOSIS — M25561 Pain in right knee: Secondary | ICD-10-CM | POA: Diagnosis not present

## 2017-08-07 DIAGNOSIS — R2689 Other abnormalities of gait and mobility: Secondary | ICD-10-CM | POA: Diagnosis not present

## 2017-08-07 DIAGNOSIS — M6281 Muscle weakness (generalized): Secondary | ICD-10-CM | POA: Diagnosis not present

## 2017-08-07 DIAGNOSIS — M25661 Stiffness of right knee, not elsewhere classified: Secondary | ICD-10-CM | POA: Diagnosis not present

## 2017-08-09 DIAGNOSIS — M25661 Stiffness of right knee, not elsewhere classified: Secondary | ICD-10-CM | POA: Diagnosis not present

## 2017-08-09 DIAGNOSIS — R2689 Other abnormalities of gait and mobility: Secondary | ICD-10-CM | POA: Diagnosis not present

## 2017-08-09 DIAGNOSIS — M6281 Muscle weakness (generalized): Secondary | ICD-10-CM | POA: Diagnosis not present

## 2017-08-09 DIAGNOSIS — M25561 Pain in right knee: Secondary | ICD-10-CM | POA: Diagnosis not present

## 2017-10-03 DIAGNOSIS — H35371 Puckering of macula, right eye: Secondary | ICD-10-CM | POA: Diagnosis not present

## 2017-10-03 DIAGNOSIS — H47323 Drusen of optic disc, bilateral: Secondary | ICD-10-CM | POA: Diagnosis not present

## 2017-10-03 DIAGNOSIS — E1165 Type 2 diabetes mellitus with hyperglycemia: Secondary | ICD-10-CM | POA: Diagnosis not present

## 2017-10-03 DIAGNOSIS — H26493 Other secondary cataract, bilateral: Secondary | ICD-10-CM | POA: Diagnosis not present

## 2017-10-03 DIAGNOSIS — E119 Type 2 diabetes mellitus without complications: Secondary | ICD-10-CM | POA: Diagnosis not present

## 2017-11-13 DIAGNOSIS — J3489 Other specified disorders of nose and nasal sinuses: Secondary | ICD-10-CM | POA: Diagnosis not present

## 2017-11-13 DIAGNOSIS — E119 Type 2 diabetes mellitus without complications: Secondary | ICD-10-CM | POA: Diagnosis not present

## 2017-11-13 DIAGNOSIS — E663 Overweight: Secondary | ICD-10-CM | POA: Diagnosis not present

## 2017-11-13 DIAGNOSIS — B373 Candidiasis of vulva and vagina: Secondary | ICD-10-CM | POA: Diagnosis not present

## 2017-11-13 DIAGNOSIS — Z6829 Body mass index (BMI) 29.0-29.9, adult: Secondary | ICD-10-CM | POA: Diagnosis not present

## 2017-11-13 DIAGNOSIS — T50905A Adverse effect of unspecified drugs, medicaments and biological substances, initial encounter: Secondary | ICD-10-CM | POA: Diagnosis not present

## 2018-01-01 DIAGNOSIS — E119 Type 2 diabetes mellitus without complications: Secondary | ICD-10-CM | POA: Diagnosis not present

## 2018-01-01 DIAGNOSIS — Z23 Encounter for immunization: Secondary | ICD-10-CM | POA: Diagnosis not present

## 2018-01-01 DIAGNOSIS — E663 Overweight: Secondary | ICD-10-CM | POA: Diagnosis not present

## 2018-01-01 DIAGNOSIS — Z1389 Encounter for screening for other disorder: Secondary | ICD-10-CM | POA: Diagnosis not present

## 2018-01-01 DIAGNOSIS — Z6828 Body mass index (BMI) 28.0-28.9, adult: Secondary | ICD-10-CM | POA: Diagnosis not present

## 2018-02-27 ENCOUNTER — Ambulatory Visit (INDEPENDENT_AMBULATORY_CARE_PROVIDER_SITE_OTHER): Payer: Medicare Other | Admitting: Obstetrics and Gynecology

## 2018-02-27 ENCOUNTER — Encounter: Payer: Self-pay | Admitting: Obstetrics and Gynecology

## 2018-02-27 ENCOUNTER — Other Ambulatory Visit: Payer: Self-pay

## 2018-02-27 VITALS — BP 114/60 | HR 64 | Resp 14 | Ht 63.0 in | Wt 175.0 lb

## 2018-02-27 DIAGNOSIS — Z01419 Encounter for gynecological examination (general) (routine) without abnormal findings: Secondary | ICD-10-CM | POA: Diagnosis not present

## 2018-02-27 DIAGNOSIS — K429 Umbilical hernia without obstruction or gangrene: Secondary | ICD-10-CM | POA: Diagnosis not present

## 2018-02-27 DIAGNOSIS — Z124 Encounter for screening for malignant neoplasm of cervix: Secondary | ICD-10-CM

## 2018-02-27 MED ORDER — NYSTATIN 100000 UNIT/GM EX CREA: 1.0000 "application " | TOPICAL_CREAM | Freq: Two times a day (BID) | CUTANEOUS | 0 refills | Status: DC

## 2018-02-27 MED ORDER — TRIAMCINOLONE ACETONIDE 0.025 % EX OINT: 1.0000 "application " | TOPICAL_OINTMENT | Freq: Two times a day (BID) | CUTANEOUS | 0 refills | Status: DC

## 2018-02-27 NOTE — Progress Notes (Signed)
67 y.o. G57P2012 Married Caucasian female here for annual exam.    Denies vaginal bleeding.  No bladder control problems.   Had a right knee replacement.   Has been on new medications for DM.  Now on Metformin and Amaryl. HgbA1C 6.7. Feeling well.   PCP: Dr. Elfredia Nevins    Patient's last menstrual period was 08/21/1988.           Sexually active: No.  The current method of family planning is post menopausal status.    Exercising: Yes.    walking Smoker:  no  Health Maintenance: Pap: 02/26/17 Negative History of abnormal Pap:  no MMG:  03/01/17 BIRADS 1 negative/density b.  Has appt next week.  Colonoscopy:  Never.  Does IFOBs through PCP.  BMD:   03/01/17  Result  Normal TDaP:  02/26/17 Gardasil:   n/a Hep C: 2018 w/ PCP Screening Labs:   PCP.    reports that she has never smoked. She has never used smokeless tobacco. She reports that she does not drink alcohol or use drugs.  Past Medical History:  Diagnosis Date  . Arthritis   . Complication of anesthesia   . Diabetes mellitus without complication (HCC)   . Hyperlipidemia   . Hypertension   . PONV (postoperative nausea and vomiting)     Past Surgical History:  Procedure Laterality Date  . arthroscopic surgery Left   . BREAST SURGERY Left    1998  . cataract surgery    . DILATION AND CURETTAGE OF UTERUS    . extra digit removed     thumb  . FOOT SURGERY Right   . KNEE ARTHROPLASTY Right 05/28/2017   Procedure: COMPUTER ASSISTED TOTAL KNEE ARTHROPLASTY;  Surgeon: Samson Frederic, MD;  Location: [facility] OR;  Service: Orthopedics;  Laterality: Right;  . REFRACTIVE SURGERY     both eyes  . TOTAL KNEE ARTHROPLASTY Left 05/14/2015   Procedure: LEFT TOTAL KNEE ARTHROPLASTY;  Surgeon: Samson Frederic, MD;  Location: [facility];  Service: Orthopedics;  Laterality: Left;  . TUBAL LIGATION      Current Outpatient Medications  Medication Sig Dispense Refill  . atorvastatin (LIPITOR) 20 MG tablet Take 20 mg by mouth at bedtime.     . calcium-vitamin D (OSCAL 500/200 D-3) 500-200 MG-UNIT per tablet Take 1 tablet by mouth daily with lunch.     . dimenhyDRINATE (DRAMAMINE) 50 MG tablet Take 25 mg by mouth every 8 (eight) hours as needed for dizziness.    Marland Kitchen Fexofenadine-Pseudoephedrine (ALLEGRA-D 12 HOUR PO) Take 1 tablet by mouth daily as needed (for allergies).    . fluticasone (FLONASE) 50 MCG/ACT nasal spray Place 1 spray into both nostrils at bedtime.    Marland Kitchen glimepiride (AMARYL) 1 MG tablet Take 1 tablet by mouth at bedtime.    . Homeopathic Products (EARACHE DROPS OT) Place 1-2 drops in ear(s) daily as needed (for sinus/ear pain.).    Marland Kitchen hydrochlorothiazide (HYDRODIURIL) 25 MG tablet Take 25 mg by mouth daily with lunch.     . losartan (COZAAR) 50 MG tablet Take 50 mg by mouth 2 (two) times daily.     . Menthol, Topical Analgesic, (BIOFREEZE EX) Apply 1-2 application topically 3 (three) times daily as needed (for knee pain.(DOES NOT USE WITH HORSE LINIMENT)). ROLL ON FORMULATION    . metFORMIN (GLUCOPHAGE-XR) 500 MG 24 hr tablet Take 500 mg by mouth 2 (two) times daily.    . Methylcellulose, Laxative, (CITRUCEL) 500 MG TABS Take 500 mg by mouth at  bedtime.    . nabumetone (RELAFEN) 500 MG tablet Take 500 mg by mouth 2 (two) times daily.     . potassium chloride (K-DUR,KLOR-CON) 10 MEQ tablet Take 10 mEq by mouth daily.     . Tetrahydrozoline HCl (VISINE EXTRA OP) Place 1-2 drops into both eyes 3 (three) times daily as needed (dry eyes.).     No current facility-administered medications for this visit.     Family History  Problem Relation Age of Onset  . Cancer Mother        liver  . Hypertension Mother   . Heart disease Mother        CVD  . Cancer Father        lymphoma    Review of Systems  Constitutional: Negative.   HENT: Negative.   Eyes: Negative.   Respiratory: Negative.   Cardiovascular: Negative.   Gastrointestinal: Negative.   Endocrine: Negative.   Genitourinary: Negative.   Musculoskeletal:  Negative.   Skin: Negative.   Allergic/Immunologic: Negative.   Neurological: Negative.   Hematological: Negative.   Psychiatric/Behavioral: Negative.     Exam:   BP 114/60 (BP Location: Right Arm, Patient Position: Sitting, Cuff Size: Normal)   Pulse 64   Resp 14   Ht 5\' 3"  (1.6 m)   Wt 175 lb (79.4 kg)   LMP 08/21/1988   BMI 31.00 kg/m     General appearance: alert, cooperative and appears stated age Head: Normocephalic, without obvious abnormality, atraumatic Neck: no adenopathy, supple, symmetrical, trachea midline and thyroid normal to inspection and palpation Lungs: clear to auscultation bilaterally Breasts: normal appearance, no masses or tenderness, No nipple retraction or dimpling, No nipple discharge or bleeding, No axillary or supraclavicular adenopathy Heart: regular rate and rhythm Abdomen: soft, non-tender; no masses, no organomegaly.  2.5 cm umbilical hernia - reducible.  Extremities: extremities normal, atraumatic, no cyanosis or edema Skin: Skin color, texture, turgor normal. No rashes or lesions Lymph nodes: Cervical, supraclavicular, and axillary nodes normal. No abnormal inguinal nodes palpated Neurologic: Grossly normal  Pelvic: External genitalia:  Generalized erythema of the vulva.               Urethra:  normal appearing urethra with no masses, tenderness or lesions              Bartholins and Skenes: normal                 Vagina: normal appearing vagina with normal color and discharge, no lesions              Cervix: no lesions              Pap taken: No. Bimanual Exam:  Uterus:  normal size, contour, position, consistency, mobility, non-tender              Adnexa: no mass, fullness, tenderness              Rectal exam: Yes.  .  Confirms.              Anus:  normal sphincter tone, no lesions  Chaperone was present for exam.  Assessment:   Well woman visit with normal exam. Umbilical hernia.  DM.  Vulvitis.  I suspect yeast.  Plan: Mammogram  screening. Recommended self breast awareness. Pap and HR HPV as above. Guidelines for Calcium, Vitamin D, regular exercise program including cardiovascular and weight bearing exercise. Nystatin ointment and Nystatin cream Rxs. I encouraged good sugar control.  Eat yogurt  daily also to reduce risk of yeast infections. Follow up annually and prn.   After visit summary provided.

## 2018-02-27 NOTE — Patient Instructions (Signed)

## 2018-04-09 DIAGNOSIS — Z6829 Body mass index (BMI) 29.0-29.9, adult: Secondary | ICD-10-CM | POA: Diagnosis not present

## 2018-04-09 DIAGNOSIS — M13842 Other specified arthritis, left hand: Secondary | ICD-10-CM | POA: Diagnosis not present

## 2018-04-09 DIAGNOSIS — E782 Mixed hyperlipidemia: Secondary | ICD-10-CM | POA: Diagnosis not present

## 2018-04-09 DIAGNOSIS — E119 Type 2 diabetes mellitus without complications: Secondary | ICD-10-CM | POA: Diagnosis not present

## 2018-04-09 DIAGNOSIS — E663 Overweight: Secondary | ICD-10-CM | POA: Diagnosis not present

## 2018-06-03 ENCOUNTER — Other Ambulatory Visit: Payer: Self-pay | Admitting: Obstetrics and Gynecology

## 2018-06-03 ENCOUNTER — Ambulatory Visit (HOSPITAL_COMMUNITY): Payer: No Typology Code available for payment source

## 2018-06-03 DIAGNOSIS — Z1231 Encounter for screening mammogram for malignant neoplasm of breast: Secondary | ICD-10-CM

## 2018-06-13 ENCOUNTER — Encounter (HOSPITAL_COMMUNITY): Payer: Self-pay

## 2018-06-13 ENCOUNTER — Ambulatory Visit (HOSPITAL_COMMUNITY)
Admission: RE | Admit: 2018-06-13 | Discharge: 2018-06-13 | Disposition: A | Discharge status: Home / Self Care | Payer: Medicare Other | Source: Ambulatory Visit | Attending: Obstetrics and Gynecology | Admitting: Obstetrics and Gynecology

## 2018-06-13 DIAGNOSIS — Z1231 Encounter for screening mammogram for malignant neoplasm of breast: Secondary | ICD-10-CM

## 2018-06-17 ENCOUNTER — Ambulatory Visit (HOSPITAL_COMMUNITY): Payer: No Typology Code available for payment source

## 2018-06-21 DIAGNOSIS — Z23 Encounter for immunization: Secondary | ICD-10-CM | POA: Diagnosis not present

## 2018-07-04 ENCOUNTER — Ambulatory Visit (HOSPITAL_COMMUNITY)
Admission: RE | Admit: 2018-07-04 | Discharge: 2018-07-04 | Disposition: A | Discharge status: Home / Self Care | Payer: Medicare Other | Source: Ambulatory Visit | Attending: Obstetrics and Gynecology | Admitting: Obstetrics and Gynecology

## 2018-07-04 DIAGNOSIS — Z1231 Encounter for screening mammogram for malignant neoplasm of breast: Secondary | ICD-10-CM | POA: Insufficient documentation

## 2018-07-16 DIAGNOSIS — E782 Mixed hyperlipidemia: Secondary | ICD-10-CM | POA: Diagnosis not present

## 2018-07-16 DIAGNOSIS — Z6831 Body mass index (BMI) 31.0-31.9, adult: Secondary | ICD-10-CM | POA: Diagnosis not present

## 2018-07-16 DIAGNOSIS — Z0000 Encounter for general adult medical examination without abnormal findings: Secondary | ICD-10-CM | POA: Diagnosis not present

## 2018-07-16 DIAGNOSIS — E785 Hyperlipidemia, unspecified: Secondary | ICD-10-CM | POA: Diagnosis not present

## 2018-07-16 DIAGNOSIS — Z1389 Encounter for screening for other disorder: Secondary | ICD-10-CM | POA: Diagnosis not present

## 2018-07-16 DIAGNOSIS — E6609 Other obesity due to excess calories: Secondary | ICD-10-CM | POA: Diagnosis not present

## 2018-07-16 DIAGNOSIS — Z0001 Encounter for general adult medical examination with abnormal findings: Secondary | ICD-10-CM | POA: Diagnosis not present

## 2018-07-16 DIAGNOSIS — I10 Essential (primary) hypertension: Secondary | ICD-10-CM | POA: Diagnosis not present

## 2018-07-16 DIAGNOSIS — E1165 Type 2 diabetes mellitus with hyperglycemia: Secondary | ICD-10-CM | POA: Diagnosis not present

## 2018-09-23 DIAGNOSIS — Z6831 Body mass index (BMI) 31.0-31.9, adult: Secondary | ICD-10-CM | POA: Diagnosis not present

## 2018-09-23 DIAGNOSIS — E6609 Other obesity due to excess calories: Secondary | ICD-10-CM | POA: Diagnosis not present

## 2018-09-23 DIAGNOSIS — J309 Allergic rhinitis, unspecified: Secondary | ICD-10-CM | POA: Diagnosis not present

## 2018-09-23 DIAGNOSIS — J019 Acute sinusitis, unspecified: Secondary | ICD-10-CM | POA: Diagnosis not present

## 2018-10-08 DIAGNOSIS — H26493 Other secondary cataract, bilateral: Secondary | ICD-10-CM | POA: Diagnosis not present

## 2018-10-08 DIAGNOSIS — E119 Type 2 diabetes mellitus without complications: Secondary | ICD-10-CM | POA: Diagnosis not present

## 2018-10-14 ENCOUNTER — Other Ambulatory Visit (HOSPITAL_COMMUNITY): Payer: Self-pay | Admitting: Family Medicine

## 2018-10-14 ENCOUNTER — Ambulatory Visit (HOSPITAL_COMMUNITY)
Admission: RE | Admit: 2018-10-14 | Discharge: 2018-10-14 | Disposition: A | Discharge status: Home / Self Care | Payer: Medicare Other | Source: Ambulatory Visit | Attending: Family Medicine | Admitting: Family Medicine

## 2018-10-14 DIAGNOSIS — Z1389 Encounter for screening for other disorder: Secondary | ICD-10-CM | POA: Diagnosis not present

## 2018-10-14 DIAGNOSIS — E6609 Other obesity due to excess calories: Secondary | ICD-10-CM | POA: Diagnosis not present

## 2018-10-14 DIAGNOSIS — M79642 Pain in left hand: Secondary | ICD-10-CM | POA: Diagnosis not present

## 2018-10-14 DIAGNOSIS — M25542 Pain in joints of left hand: Secondary | ICD-10-CM | POA: Diagnosis not present

## 2018-10-14 DIAGNOSIS — Z6831 Body mass index (BMI) 31.0-31.9, adult: Secondary | ICD-10-CM | POA: Diagnosis not present

## 2018-10-14 DIAGNOSIS — M13842 Other specified arthritis, left hand: Secondary | ICD-10-CM | POA: Diagnosis not present

## 2019-03-21 ENCOUNTER — Ambulatory Visit: Payer: Medicare Other | Admitting: Obstetrics and Gynecology

## 2019-05-07 ENCOUNTER — Ambulatory Visit: Payer: Medicare Other | Admitting: Obstetrics and Gynecology

## 2019-05-23 DIAGNOSIS — E119 Type 2 diabetes mellitus without complications: Secondary | ICD-10-CM | POA: Diagnosis not present

## 2019-05-23 DIAGNOSIS — Z23 Encounter for immunization: Secondary | ICD-10-CM | POA: Diagnosis not present

## 2019-05-23 DIAGNOSIS — E6609 Other obesity due to excess calories: Secondary | ICD-10-CM | POA: Diagnosis not present

## 2019-05-23 DIAGNOSIS — Z6833 Body mass index (BMI) 33.0-33.9, adult: Secondary | ICD-10-CM | POA: Diagnosis not present

## 2019-05-26 DIAGNOSIS — E119 Type 2 diabetes mellitus without complications: Secondary | ICD-10-CM | POA: Diagnosis not present

## 2019-07-21 DIAGNOSIS — E7849 Other hyperlipidemia: Secondary | ICD-10-CM | POA: Diagnosis not present

## 2019-07-21 DIAGNOSIS — E1165 Type 2 diabetes mellitus with hyperglycemia: Secondary | ICD-10-CM | POA: Diagnosis not present

## 2019-07-21 DIAGNOSIS — M1712 Unilateral primary osteoarthritis, left knee: Secondary | ICD-10-CM | POA: Diagnosis not present

## 2019-07-21 DIAGNOSIS — I10 Essential (primary) hypertension: Secondary | ICD-10-CM | POA: Diagnosis not present

## 2019-08-21 DIAGNOSIS — I10 Essential (primary) hypertension: Secondary | ICD-10-CM | POA: Diagnosis not present

## 2019-08-21 DIAGNOSIS — E1165 Type 2 diabetes mellitus with hyperglycemia: Secondary | ICD-10-CM | POA: Diagnosis not present

## 2019-08-21 DIAGNOSIS — E7849 Other hyperlipidemia: Secondary | ICD-10-CM | POA: Diagnosis not present

## 2019-08-21 DIAGNOSIS — M1712 Unilateral primary osteoarthritis, left knee: Secondary | ICD-10-CM | POA: Diagnosis not present

## 2019-09-21 DIAGNOSIS — E1165 Type 2 diabetes mellitus with hyperglycemia: Secondary | ICD-10-CM | POA: Diagnosis not present

## 2019-09-21 DIAGNOSIS — M1712 Unilateral primary osteoarthritis, left knee: Secondary | ICD-10-CM | POA: Diagnosis not present

## 2019-09-21 DIAGNOSIS — I10 Essential (primary) hypertension: Secondary | ICD-10-CM | POA: Diagnosis not present

## 2019-09-21 DIAGNOSIS — E7849 Other hyperlipidemia: Secondary | ICD-10-CM | POA: Diagnosis not present

## 2019-10-19 DIAGNOSIS — E1165 Type 2 diabetes mellitus with hyperglycemia: Secondary | ICD-10-CM | POA: Diagnosis not present

## 2019-10-19 DIAGNOSIS — M1712 Unilateral primary osteoarthritis, left knee: Secondary | ICD-10-CM | POA: Diagnosis not present

## 2019-10-19 DIAGNOSIS — I10 Essential (primary) hypertension: Secondary | ICD-10-CM | POA: Diagnosis not present

## 2019-10-19 DIAGNOSIS — E7849 Other hyperlipidemia: Secondary | ICD-10-CM | POA: Diagnosis not present

## 2019-10-21 DIAGNOSIS — Z23 Encounter for immunization: Secondary | ICD-10-CM | POA: Diagnosis not present

## 2019-10-28 DIAGNOSIS — Z6833 Body mass index (BMI) 33.0-33.9, adult: Secondary | ICD-10-CM | POA: Diagnosis not present

## 2019-10-28 DIAGNOSIS — Z0000 Encounter for general adult medical examination without abnormal findings: Secondary | ICD-10-CM | POA: Diagnosis not present

## 2019-10-28 DIAGNOSIS — M67874 Other specified disorders of tendon, left ankle and foot: Secondary | ICD-10-CM | POA: Diagnosis not present

## 2019-10-28 DIAGNOSIS — E119 Type 2 diabetes mellitus without complications: Secondary | ICD-10-CM | POA: Diagnosis not present

## 2019-10-28 DIAGNOSIS — E6609 Other obesity due to excess calories: Secondary | ICD-10-CM | POA: Diagnosis not present

## 2019-10-28 DIAGNOSIS — E782 Mixed hyperlipidemia: Secondary | ICD-10-CM | POA: Diagnosis not present

## 2019-11-12 ENCOUNTER — Encounter: Payer: Self-pay | Admitting: Certified Nurse Midwife

## 2019-11-18 DIAGNOSIS — Z23 Encounter for immunization: Secondary | ICD-10-CM | POA: Diagnosis not present

## 2019-11-19 DIAGNOSIS — M1712 Unilateral primary osteoarthritis, left knee: Secondary | ICD-10-CM | POA: Diagnosis not present

## 2019-11-19 DIAGNOSIS — E7849 Other hyperlipidemia: Secondary | ICD-10-CM | POA: Diagnosis not present

## 2019-11-19 DIAGNOSIS — I10 Essential (primary) hypertension: Secondary | ICD-10-CM | POA: Diagnosis not present

## 2019-11-19 DIAGNOSIS — E1165 Type 2 diabetes mellitus with hyperglycemia: Secondary | ICD-10-CM | POA: Diagnosis not present

## 2019-11-26 ENCOUNTER — Ambulatory Visit: Payer: Medicare Other | Admitting: Obstetrics and Gynecology

## 2019-12-19 DIAGNOSIS — E7849 Other hyperlipidemia: Secondary | ICD-10-CM | POA: Diagnosis not present

## 2019-12-19 DIAGNOSIS — I10 Essential (primary) hypertension: Secondary | ICD-10-CM | POA: Diagnosis not present

## 2019-12-19 DIAGNOSIS — E1165 Type 2 diabetes mellitus with hyperglycemia: Secondary | ICD-10-CM | POA: Diagnosis not present

## 2019-12-19 DIAGNOSIS — M1712 Unilateral primary osteoarthritis, left knee: Secondary | ICD-10-CM | POA: Diagnosis not present

## 2020-01-09 DIAGNOSIS — M25572 Pain in left ankle and joints of left foot: Secondary | ICD-10-CM | POA: Diagnosis not present

## 2020-01-09 DIAGNOSIS — M79672 Pain in left foot: Secondary | ICD-10-CM | POA: Diagnosis not present

## 2020-01-09 DIAGNOSIS — M67962 Unspecified disorder of synovium and tendon, left lower leg: Secondary | ICD-10-CM | POA: Diagnosis not present

## 2020-01-19 DIAGNOSIS — I10 Essential (primary) hypertension: Secondary | ICD-10-CM | POA: Diagnosis not present

## 2020-01-19 DIAGNOSIS — E7849 Other hyperlipidemia: Secondary | ICD-10-CM | POA: Diagnosis not present

## 2020-01-19 DIAGNOSIS — E1165 Type 2 diabetes mellitus with hyperglycemia: Secondary | ICD-10-CM | POA: Diagnosis not present

## 2020-01-19 DIAGNOSIS — M1712 Unilateral primary osteoarthritis, left knee: Secondary | ICD-10-CM | POA: Diagnosis not present

## 2020-01-28 ENCOUNTER — Other Ambulatory Visit: Payer: Self-pay

## 2020-01-28 ENCOUNTER — Ambulatory Visit (HOSPITAL_COMMUNITY): Payer: Medicare Other | Attending: Student | Admitting: Physical Therapy

## 2020-01-28 DIAGNOSIS — M25571 Pain in right ankle and joints of right foot: Secondary | ICD-10-CM | POA: Diagnosis present

## 2020-01-28 DIAGNOSIS — R262 Difficulty in walking, not elsewhere classified: Secondary | ICD-10-CM | POA: Insufficient documentation

## 2020-01-28 DIAGNOSIS — M25572 Pain in left ankle and joints of left foot: Secondary | ICD-10-CM | POA: Diagnosis present

## 2020-01-28 NOTE — Therapy (Signed)
[facility] [address] [place], [place], [address] Phone: [phone]   Fax:  [phone]  Physical Therapy Evaluation  Patient Details  Name: Tracy Bonilla MRN: [id number] Date of Birth: 1951/07/27 Referring Provider (PT): Alfredo Martinez PA   Encounter Date: 01/28/2020  PT End of Session - 01/28/20 1104    Visit Number  1    Number of Visits  12    Date for PT Re-Evaluation  03/10/20    Authorization Type  medicare primary, [place] secondary, no auth    Progress Note Due on Visit  10    PT Start Time  1045    PT Stop Time  1125    PT Time Calculation (min)  40 min    Equipment Utilized During Treatment  Other (comment)   left ankle brace   Activity Tolerance  Patient limited by pain    Behavior During Therapy  [facility] for tasks assessed/performed       Past Medical History:  Diagnosis Date  . Arthritis   . Complication of anesthesia   . Diabetes mellitus without complication (HCC)   . Hyperlipidemia   . Hypertension   . PONV (postoperative nausea and vomiting)     Past Surgical History:  Procedure Laterality Date  . arthroscopic surgery Left   . BREAST SURGERY Left    1998  . cataract surgery    . DILATION AND CURETTAGE OF UTERUS    . extra digit removed     thumb  . FOOT SURGERY Right   . KNEE ARTHROPLASTY Right 05/28/2017   Procedure: COMPUTER ASSISTED TOTAL KNEE ARTHROPLASTY;  Surgeon: Samson Frederic, MD;  Location: [facility] OR;  Service: Orthopedics;  Laterality: Right;  . REFRACTIVE SURGERY     both eyes  . TOTAL KNEE ARTHROPLASTY Left 05/14/2015   Procedure: LEFT TOTAL KNEE ARTHROPLASTY;  Surgeon: Samson Frederic, MD;  Location: [facility] ORS;  Service: Orthopedics;  Laterality: Left;  . TUBAL LIGATION      There were no vitals filed for this visit.   Subjective Assessment - 01/28/20 1053    Subjective  States that last summer church was outside and the lateral side of her foot really bothered her. States she was not going a lot  of places over the last year so she really didn't address it. States she would intermittently ice it. States on the first March she almost fell because of her foot. States she needs to use her cane. States she got an ankle brace to wear.  PLOF was not using cane, uses cane now as she feels unbalanced. She would be going to the mailbox and the pain would hit either side of her foot and she doesn't know what side it will hit. The more she gets up and moves around the more it swells on her and hurts. Tends to hurt more with activity    Pertinent History  DB, L TKA 2016, R TKA 2018    Currently in Pain?  Yes    Pain Score  1     Pain Location  Foot    Pain Orientation  Left    Pain Descriptors / Indicators  Aching    Pain Onset  More than a month ago    Aggravating Factors   walking and moving.         [facility] PT Assessment - 01/28/20 0001      Assessment   Medical Diagnosis  dysfunction of post tibial tendon L  Referring Provider (PT)  Alfredo Martinez PA    Next MD Visit  July 2nd 2021    Prior Therapy  yes for her knees      Restrictions   Weight Bearing Restrictions  No      Balance Screen   Has the patient fallen in the past 6 months  No    Has the patient had a decrease in activity level because of a fear of falling?   Yes    Is the patient reluctant to leave their home because of a fear of falling?   Yes      Home Environment   Living Environment  Private residence    Living Arrangements  Spouse/significant other    Available Help at Discharge  Family    Type of Home  House    Home Access  Stairs to enter    Entrance Stairs-Number of Steps  2    Home Layout  Laundry or work area in basement;Two level    Home Equipment  Cane - single point      Prior Function   Level of Independence  Independent      Cognition   Overall Cognitive Status  Within Functional Limits for tasks assessed      Observation/Other Assessments   Observations  swelling in bialteral lower legs not in  feet; left ankle rest in about 20 degrees of eversion, right ankle rests in neutral; left arch and calcaneus collapses in standing.     Focus on Therapeutic Outcomes (FOTO)   56% limited      Observation/Other Assessments-Edema    Edema  Figure 8      Figure 8 Edema   Figure 8 - Right   48cm    Figure 8 - Left   49cm      ROM / Strength   AROM / PROM / Strength  AROM;Strength      AROM   AROM Assessment Site  Ankle;Knee    Right/Left Ankle  Left;Right    Right Ankle Dorsiflexion  2    Right Ankle Plantar Flexion  50    Right Ankle Inversion  30    Right Ankle Eversion  5    Left Ankle Dorsiflexion  0   everts foot   Left Ankle Plantar Flexion  45    Left Ankle Inversion  15    Left Ankle Eversion  20      Strength   Strength Assessment Site  Ankle;Knee    Right/Left Ankle  Right;Left      Palpation   Palpation comment  tenderness to palpation along left post tib and ant tib, pain with inversion on Left      Ambulation/Gait   Ambulation/Gait  Yes    Ambulation/Gait Assistance  6: Modified independent (Device/Increase time)    Ambulation Distance (Feet)  220 Feet    Assistive device  Straight cane   no brace    Gait Pattern  Decreased arm swing - right;Decreased arm swing - left;Decreased step length - left;Decreased step length - right;Wide base of support;Left foot flat;Right foot flat    Ambulation Surface  Level;Indoor    Gait velocity  decreased    Gait Comments        Balance   Balance Assessed  Yes      Static Standing Balance   Static Standing - Comment/# of Minutes  SLS R - with 1 UE support 30second, unable without UE assist - L unable to  perform with one UE support - 14 seconds with 2 UE support                  Objective measurements completed on examination: See above findings.              PT Education - 01/28/20 1123    Education Details  on current condition, POC and FOTO score    Person(s) Educated  Patient    Methods   Explanation    Comprehension  Verbalized understanding       PT Short Term Goals - 01/28/20 1058      PT SHORT TERM GOAL #1   Title  Patient will be independent in self management strategies to improve quality of life and functional outcomes.    Time  3    Period  Weeks    Status  New    Target Date  02/18/20      PT SHORT TERM GOAL #2   Title  Patient will report at least 25% improvement in overall symptoms and/or functional ability.    Time  3    Period  Weeks    Status  New    Target Date  02/18/20        PT Long Term Goals - 01/28/20 1059      PT LONG TERM GOAL #1   Title  Patient will report at least 50% improvement in overall symptoms and/or functional ability.    Time  6    Period  Weeks    Status  New    Target Date  03/10/20      PT LONG TERM GOAL #2   Title  Patient will be able to stand on left leg with 1 UE support for at least 10 seconds to demonstrate improved static balance.    Time  6    Period  Weeks    Status  New    Target Date  03/10/20      PT LONG TERM GOAL #3   Title  Patient will score with at least 60% function on FOTO to demonstrate improved overall function.    Time  6    Period  Weeks    Status  New    Target Date  03/10/20             Plan - 01/28/20 1101    Clinical Impression Statement  Patient presents with medial and lateral left foot pain that is worse with standing and walking. Patient demonstrated left arch and calcaneus collapse into eversion with weight bearing which is not present on right side. Functionally patient is very weak in left lower extremity and limited in functional DF on left. Patient would greatly benefit from skilled physical therapy to improve functional mobility and decrease overall pain levels with weight bearing.    Personal Factors and Comorbidities  Comorbidity 1;Comorbidity 2;Age    Comorbidities  DB, L TKA 2016, R TKA 2018    Examination-Activity Limitations  Stand;Stairs;Locomotion Level     Examination-Participation Restrictions  Church;Cleaning;Community Activity;Laundry;Meal Prep    Stability/Clinical Decision Making  Stable/Uncomplicated    Clinical Decision Making  Low    Rehab Potential  Good    PT Frequency  2x / week    PT Duration  6 weeks    PT Treatment/Interventions  ADLs/Self Care Home Management;Aquatic Therapy;Electrical Stimulation;Iontophoresis 4mg /ml Dexamethasone;Moist Heat;Traction;Balance training;Therapeutic exercise;Therapeutic activities;Functional mobility training;Stair training;Gait training;DME Instruction;Neuromuscular re-education;Patient/family education;Manual techniques;Dry needling;Passive range of motion;Joint Manipulations;Cryotherapy;Taping  PT Next Visit Plan  gastroc stretch, ankle strengtheing, hip strentghtenign, balance, arch strengthening.    PT Home Exercise Plan  initiate next session    Consulted and Agree with Plan of Care  Patient       Patient will benefit from skilled therapeutic intervention in order to improve the following deficits and impairments:  Pain, Decreased balance, Decreased mobility, Difficulty walking, Hypermobility, Decreased range of motion, Abnormal gait, Decreased endurance, Increased edema, Decreased activity tolerance, Decreased knowledge of use of DME, Decreased strength  Visit Diagnosis: Pain in right ankle and joints of right foot  Difficulty in walking, not elsewhere classified     Problem List Patient Active Problem List   Diagnosis Date Noted  . Umbilical hernia 02/27/2018  . Osteoarthritis of right knee 05/28/2017  . Unspecified essential hypertension 03/03/2014    Class: History of  . Type 2 diabetes mellitus (HCC) 03/03/2014    Class: History of  . CONTRACTURE OF SHOULDER JOINT 12/03/2007  . SHOULDER PAIN 12/03/2007  . IMPINGEMENT SYNDROME 12/03/2007    11:53 AM, 01/28/20 Tereasa Coop, DPT Physical Therapy with [facility]  [phone] office  [facility] [address] [place], [place], [address] Phone: [phone]   Fax:  [phone]  Name: Tracy Bonilla MRN: [id number] Date of Birth: 05/22/1951

## 2020-02-03 ENCOUNTER — Encounter (HOSPITAL_COMMUNITY): Payer: Self-pay

## 2020-02-03 ENCOUNTER — Other Ambulatory Visit: Payer: Self-pay

## 2020-02-03 ENCOUNTER — Ambulatory Visit (HOSPITAL_COMMUNITY): Payer: Medicare Other

## 2020-02-03 DIAGNOSIS — M25572 Pain in left ankle and joints of left foot: Secondary | ICD-10-CM

## 2020-02-03 DIAGNOSIS — R262 Difficulty in walking, not elsewhere classified: Secondary | ICD-10-CM

## 2020-02-03 NOTE — Therapy (Signed)
[facility] [address] [place], [place], [address] Phone: [phone]   Fax:  [phone]  Physical Therapy Treatment  Patient Details  Name: Tracy Bonilla MRN: [id number] Date of Birth: [birth year] Referring Provider (PT): Alfredo Martinez PA   Encounter Date: 02/03/2020   PT End of Session - 02/03/20 1459    Visit Number 2    Number of Visits 12    Date for PT Re-Evaluation 03/10/20    Authorization Type medicare primary, [place] secondary, no auth    Progress Note Due on Visit 10    PT Start Time 1453    PT Stop Time 1539    PT Time Calculation (min) 46 min    Equipment Utilized During Treatment Other (comment)   brace and SPC   Activity Tolerance Patient tolerated treatment well    Behavior During Therapy [facility] for tasks assessed/performed           Past Medical History:  Diagnosis Date  . Arthritis   . Complication of anesthesia   . Diabetes mellitus without complication (HCC)   . Hyperlipidemia   . Hypertension   . PONV (postoperative nausea and vomiting)     Past Surgical History:  Procedure Laterality Date  . arthroscopic surgery Left   . BREAST SURGERY Left    1998  . cataract surgery    . DILATION AND CURETTAGE OF UTERUS    . extra digit removed     thumb  . FOOT SURGERY Right   . KNEE ARTHROPLASTY Right 05/28/2017   Procedure: COMPUTER ASSISTED TOTAL KNEE ARTHROPLASTY;  Surgeon: Samson Frederic, MD;  Location: [facility] OR;  Service: Orthopedics;  Laterality: Right;  . REFRACTIVE SURGERY     both eyes  . TOTAL KNEE ARTHROPLASTY Left 05/14/2015   Procedure: LEFT TOTAL KNEE ARTHROPLASTY;  Surgeon: Samson Frederic, MD;  Location: [facility];  Service: Orthopedics;  Laterality: Left;  . TUBAL LIGATION      There were no vitals filed for this visit.   Subjective Assessment - 02/03/20 1456    Subjective Pt arrived with SPC and brace on Lt ankle.  Reports intermittent pain 1-2/10 Lt foot.  Edema present Lt LE.    Pertinent  History DB, L TKA 2016, R TKA 2018    Patient Stated Goals No surgery, stop wearing brace, reduce pain and improve gait.    Currently in Pain? Yes    Pain Score 2     Pain Location Ankle    Pain Orientation Left    Pain Descriptors / Indicators Aching    Pain Type Chronic pain    Pain Onset More than a month ago    Pain Frequency Intermittent    Aggravating Factors  walking and moving              [facility] PT Assessment - 02/03/20 0001      Assessment   Medical Diagnosis dysfunction of post tibial tendon L    Referring Provider (PT) Alfredo Martinez PA    Next MD Visit July 2nd 2021    Prior Therapy yes for her knees                         [facility] Adult PT Treatment/Exercise - 02/03/20 0001      Ambulation/Gait   Ambulation/Gait Yes    Ambulation/Gait Assistance 6: Modified independent (Device/Increase time)    Ambulation Distance (Feet) 226 Feet    Assistive device Straight  cane    Ambulation Surface Level;Indoor    Gait velocity decreased    Gait Comments Educated sequence wiht Rt UE      Exercises   Exercises Ankle      Ankle Exercises: Standing   Heel Raises 10 reps    Toe Raise 10 reps      Ankle Exercises: Seated   Towel Crunch 1 rep    Towel Crunch Limitations to end of towel    Marble Pickup 10x small marbles    Heel Raises 10 reps    Heel Raises Limitations seated    Toe Raise 10 reps    Toe Raise Limitations seated                  PT Education - 02/03/20 1502    Education Details Reviewed goals, educated importance of HEP compliance which was established this session.  Educated benefits with compression garmets for edema control, butler to assist with donning; measurements taken and paperwork given.    Person(s) Educated Patient    Methods Explanation;Demonstration;Verbal cues;Handout    Comprehension Verbalized understanding;Returned demonstration            PT Short Term Goals - 01/28/20 1058      PT SHORT TERM GOAL #1    Title Patient will be independent in self management strategies to improve quality of life and functional outcomes.    Time 3    Period Weeks    Status New    Target Date 02/18/20      PT SHORT TERM GOAL #2   Title Patient will report at least 25% improvement in overall symptoms and/or functional ability.    Time 3    Period Weeks    Status New    Target Date 02/18/20             PT Long Term Goals - 01/28/20 1059      PT LONG TERM GOAL #1   Title Patient will report at least 50% improvement in overall symptoms and/or functional ability.    Time 6    Period Weeks    Status New    Target Date 03/10/20      PT LONG TERM GOAL #2   Title Patient will be able to stand on left leg with 1 UE support for at least 10 seconds to demonstrate improved static balance.    Time 6    Period Weeks    Status New    Target Date 03/10/20      PT LONG TERM GOAL #3   Title Patient will score with at least 60% function on FOTO to demonstrate improved overall function.    Time 6    Period Weeks    Status New    Target Date 03/10/20                 Plan - 02/03/20 1553    Clinical Impression Statement Began session revieweing goals and educating importance of HEP compliance for maximal benefits.  HEP established this session for ankle and intrinsic strengthening exercses to address the fallen arch.  Noted edema present around Lt ankle, pt educated on benefits of compression socks to reduced edema for pain control.  Due to mobility and difficulty recommend mild supprt and educated assisted devices to assist like the butler.  Measurements taken and paperwork given to pt.  Also educated benefits of ambulating with cane in Rt UE for pressure relief, gait training complete to assure proper  sequence.  EOS pt given HEP printout for improved follow thru at home.  No reports of pain through session.    Personal Factors and Comorbidities Comorbidity 1;Comorbidity 2;Age    Comorbidities DB, L TKA  2016, R TKA 2018    Examination-Activity Limitations Stand;Stairs;Locomotion Level    Examination-Participation Restrictions Church;Cleaning;Community Activity;Laundry;Meal Prep    Stability/Clinical Decision Making Stable/Uncomplicated    Clinical Decision Making Low    Rehab Potential Good    PT Frequency 2x / week    PT Duration 6 weeks    PT Treatment/Interventions ADLs/Self Care Home Management;Aquatic Therapy;Electrical Stimulation;Iontophoresis 4mg /ml Dexamethasone;Moist Heat;Traction;Balance training;Therapeutic exercise;Therapeutic activities;Functional mobility training;Stair training;Gait training;DME Instruction;Neuromuscular re-education;Patient/family education;Manual techniques;Dry needling;Passive range of motion;Joint Manipulations;Cryotherapy;Taping    PT Next Visit Plan gastroc stretch, ankle strengtheing, hip strentghtenign, balance, arch strengthening.  Pt may benefit from orthotic referral to address fallen arch.    PT Home Exercise Plan 6/15:  towel curl, marble pick up, heel/toe raises seated and standing with support.           Patient will benefit from skilled therapeutic intervention in order to improve the following deficits and impairments:  Pain, Decreased balance, Decreased mobility, Difficulty walking, Hypermobility, Decreased range of motion, Abnormal gait, Decreased endurance, Increased edema, Decreased activity tolerance, Decreased knowledge of use of DME, Decreased strength  Visit Diagnosis: Pain in left ankle and joints of left foot  Difficulty in walking, not elsewhere classified     Problem List Patient Active Problem List   Diagnosis Date Noted  . Umbilical hernia 86/76/1950  . Osteoarthritis of right knee 05/28/2017  . Unspecified essential hypertension 03/03/2014    Class: History of  . Type 2 diabetes mellitus ([place]) 03/03/2014    Class: History of  . CONTRACTURE OF SHOULDER JOINT 12/03/2007  . SHOULDER PAIN 12/03/2007  . IMPINGEMENT  SYNDROME 12/03/2007   Ihor Austin, LPTA/CLT; CBIS [phone]  Aldona Lento 02/03/2020, 4:01 PM  [facility] [address] [place], [place], [address] Phone: [phone]   Fax:  [phone]  Name: Tracy Bonilla MRN: [id number] Date of Birth: 08-25-1950

## 2020-02-03 NOTE — Patient Instructions (Signed)
Towel Curl    Sitting, crimp a towel up with toes of one foot. Relax foot by spreading towel out again. Repeat with other foot. Repeat 10 times. Do 1-2 sessions per day.  http://gt2.exer.us/412   Copyright  VHI. All rights reserved.   Place marbles on a towel.  Practice picking them up.  Toe / Heel Raise (Sitting)    Sitting, raise heels, then rock back on heels and raise toes. Repeat 10 times.  Copyright  VHI. All rights reserved. .   Toe / Heel Raise (Standing)    Standing with arm support, raise heels, then rock back on heels and raise toes. Repeat 10 times.  Copyright  VHI. All rights reserved.

## 2020-02-05 ENCOUNTER — Encounter (HOSPITAL_COMMUNITY): Payer: Self-pay

## 2020-02-05 ENCOUNTER — Other Ambulatory Visit: Payer: Self-pay

## 2020-02-05 ENCOUNTER — Ambulatory Visit (HOSPITAL_COMMUNITY): Payer: Medicare Other

## 2020-02-05 DIAGNOSIS — M25572 Pain in left ankle and joints of left foot: Secondary | ICD-10-CM

## 2020-02-05 DIAGNOSIS — R262 Difficulty in walking, not elsewhere classified: Secondary | ICD-10-CM

## 2020-02-05 DIAGNOSIS — M25571 Pain in right ankle and joints of right foot: Secondary | ICD-10-CM

## 2020-02-05 NOTE — Therapy (Signed)
[name] [address] [place], [place], [address] Phone: [phone]   Fax:  [phone]  Physical Therapy Treatment  Patient Details  Name: Tracy Bonilla MRN: [id number] Date of Birth: 1951-01-16 Referring Provider (PT): Alfredo Martinez PA   Encounter Date: 02/05/2020   PT End of Session - 02/05/20 1400    Visit Number 3    Number of Visits 12    Date for PT Re-Evaluation 03/10/20    Authorization Type medicare primary, [place] secondary, no auth    Progress Note Due on Visit 10    PT Start Time 1356    PT Stop Time 1440    PT Time Calculation (min) 44 min    Equipment Utilized During Treatment Other (comment)   brace and SPC   Activity Tolerance Patient tolerated treatment well    Behavior During Therapy [facility] for tasks assessed/performed           Past Medical History:  Diagnosis Date  . Arthritis   . Complication of anesthesia   . Diabetes mellitus without complication (HCC)   . Hyperlipidemia   . Hypertension   . PONV (postoperative nausea and vomiting)     Past Surgical History:  Procedure Laterality Date  . arthroscopic surgery Left   . BREAST SURGERY Left    1998  . cataract surgery    . DILATION AND CURETTAGE OF UTERUS    . extra digit removed     thumb  . FOOT SURGERY Right   . KNEE ARTHROPLASTY Right 05/28/2017   Procedure: COMPUTER ASSISTED TOTAL KNEE ARTHROPLASTY;  Surgeon: Samson Frederic, MD;  Location: [facility];  Service: Orthopedics;  Laterality: Right;  . REFRACTIVE SURGERY     both eyes  . TOTAL KNEE ARTHROPLASTY Left 05/14/2015   Procedure: LEFT TOTAL KNEE ARTHROPLASTY;  Surgeon: Samson Frederic, MD;  Location: [facility];  Service: Orthopedics;  Laterality: Left;  . TUBAL LIGATION      There were no vitals filed for this visit.   Subjective Assessment - 02/05/20 1358    Subjective Pt stated she feels the brace increases the swelling in her foot.  Stated she mowed yard and removed the brace to clean it and  her foot looked better.  Pt questions ability to wear compression garment in her shoes.    Pertinent History DB, L TKA 2016, R TKA 2018    Patient Stated Goals No surgery, stop wearing brace, reduce pain and improve gait.    Currently in Pain? No/denies                             [facility] Adult PT Treatment/Exercise - 02/05/20 0001      Exercises   Exercises Ankle      Ankle Exercises: Stretches   Slant Board Stretch 3 reps;30 seconds    Slant Board Stretch Limitations slant board      Ankle Exercises: Standing   Vector Stance Right;Left;3 reps;5 seconds   Bil UE A   SLS 5x 1" top    Heel Raises 10 reps    Heel Raises Limitations 2 sets    Toe Raise 10 reps    Toe Raise Limitations 2 sets    Other Standing Ankle Exercises tandem stance 3x 30"      Ankle Exercises: Seated   Towel Inversion/Eversion 3 reps    Other Seated Ankle Exercises STS 10x no HHA, eccentric control  PT Short Term Goals - 01/28/20 1058      PT SHORT TERM GOAL #1   Title Patient will be independent in self management strategies to improve quality of life and functional outcomes.    Time 3    Period Weeks    Status New    Target Date 02/18/20      PT SHORT TERM GOAL #2   Title Patient will report at least 25% improvement in overall symptoms and/or functional ability.    Time 3    Period Weeks    Status New    Target Date 02/18/20             PT Long Term Goals - 01/28/20 1059      PT LONG TERM GOAL #1   Title Patient will report at least 50% improvement in overall symptoms and/or functional ability.    Time 6    Period Weeks    Status New    Target Date 03/10/20      PT LONG TERM GOAL #2   Title Patient will be able to stand on left leg with 1 UE support for at least 10 seconds to demonstrate improved static balance.    Time 6    Period Weeks    Status New    Target Date 03/10/20      PT LONG TERM GOAL #3   Title Patient will score with  at least 60% function on FOTO to demonstrate improved overall function.    Time 6    Period Weeks    Status New    Target Date 03/10/20                 Plan - 02/05/20 1437    Clinical Impression Statement Session focus on ankle and hip stretches, gastroc stretches and balance activities.  Pt unsteady with SLS and NBOS required min A and/or HHA for LOB.  Pt able to complete all exercises with no reports of pain, did require some cueing for appropriate form with task.  Standing exercises complete out of brace, discussed weaning progressingly out of brace.    Personal Factors and Comorbidities Comorbidity 1;Comorbidity 2;Age    Comorbidities DB, L TKA 2016, R TKA 2018    Examination-Activity Limitations Stand;Stairs;Locomotion Level    Examination-Participation Restrictions Church;Cleaning;Community Activity;Laundry;Meal Prep    Stability/Clinical Decision Making Stable/Uncomplicated    Clinical Decision Making Low    Rehab Potential Good    PT Frequency 2x / week    PT Duration 6 weeks    PT Treatment/Interventions ADLs/Self Care Home Management;Aquatic Therapy;Electrical Stimulation;Iontophoresis 4mg /ml Dexamethasone;Moist Heat;Traction;Balance training;Therapeutic exercise;Therapeutic activities;Functional mobility training;Stair training;Gait training;DME Instruction;Neuromuscular re-education;Patient/family education;Manual techniques;Dry needling;Passive range of motion;Joint Manipulations;Cryotherapy;Taping    PT Next Visit Plan gastroc stretch, ankle strengtheing, hip strentghtenign, balance, arch strengthening.  Pt may benefit from orthotic referral to address fallen arch.    PT Home Exercise Plan 6/15:  towel curl, marble pick up, heel/toe raises seated and standing with support.           Patient will benefit from skilled therapeutic intervention in order to improve the following deficits and impairments:  Pain, Decreased balance, Decreased mobility, Difficulty walking,  Hypermobility, Decreased range of motion, Abnormal gait, Decreased endurance, Increased edema, Decreased activity tolerance, Decreased knowledge of use of DME, Decreased strength  Visit Diagnosis: Difficulty in walking, not elsewhere classified  Pain in left ankle and joints of left foot  Pain in right ankle and joints of right foot  Problem List Patient Active Problem List   Diagnosis Date Noted  . Umbilical hernia 76/16/0737  . Osteoarthritis of right knee 05/28/2017  . Unspecified essential hypertension 03/03/2014    Class: History of  . Type 2 diabetes mellitus ([place]) 03/03/2014    Class: History of  . CONTRACTURE OF SHOULDER JOINT 12/03/2007  . SHOULDER PAIN 12/03/2007  . IMPINGEMENT SYNDROME 12/03/2007   Ihor Austin, LPTA/CLT; CBIS [phone]  Aldona Lento 02/05/2020, 2:45 PM  [facility] [address] [place], [place], [address] Phone: [phone]   Fax:  [phone]  Name: SHAWNIQUE MARIOTTI MRN: [id number] Date of Birth: [birth year]

## 2020-02-09 ENCOUNTER — Encounter (HOSPITAL_COMMUNITY): Payer: Self-pay | Admitting: Physical Therapy

## 2020-02-09 ENCOUNTER — Other Ambulatory Visit: Payer: Self-pay

## 2020-02-09 ENCOUNTER — Ambulatory Visit (HOSPITAL_COMMUNITY): Payer: Medicare Other | Admitting: Physical Therapy

## 2020-02-09 DIAGNOSIS — M25571 Pain in right ankle and joints of right foot: Secondary | ICD-10-CM

## 2020-02-09 DIAGNOSIS — M25572 Pain in left ankle and joints of left foot: Secondary | ICD-10-CM | POA: Diagnosis not present

## 2020-02-09 DIAGNOSIS — R262 Difficulty in walking, not elsewhere classified: Secondary | ICD-10-CM

## 2020-02-09 NOTE — Therapy (Signed)
[facility] [address] [place], [place], [address] Phone: [phone]   Fax:  [phone]  Physical Therapy Treatment  Patient Details  Name: Tracy Bonilla MRN: [id number] Date of Birth: Feb 17, 1951 Referring Provider (PT): Alfredo Martinez PA   Encounter Date: 02/09/2020   PT End of Session - 02/09/20 1046    Visit Number 4    Number of Visits 12    Date for PT Re-Evaluation 03/10/20    Authorization Type medicare primary, [place] secondary, no auth    Progress Note Due on Visit 10    PT Start Time 1045    PT Stop Time 1123    PT Time Calculation (min) 38 min    Equipment Utilized During Treatment Other (comment)   brace and SPC   Activity Tolerance Patient tolerated treatment well    Behavior During Therapy [facility] for tasks assessed/performed           Past Medical History:  Diagnosis Date  . Arthritis   . Complication of anesthesia   . Diabetes mellitus without complication (HCC)   . Hyperlipidemia   . Hypertension   . PONV (postoperative nausea and vomiting)     Past Surgical History:  Procedure Laterality Date  . arthroscopic surgery Left   . BREAST SURGERY Left    1998  . cataract surgery    . DILATION AND CURETTAGE OF UTERUS    . extra digit removed     thumb  . FOOT SURGERY Right   . KNEE ARTHROPLASTY Right 05/28/2017   Procedure: COMPUTER ASSISTED TOTAL KNEE ARTHROPLASTY;  Surgeon: Samson Frederic, MD;  Location: [facility];  Service: Orthopedics;  Laterality: Right;  . REFRACTIVE SURGERY     both eyes  . TOTAL KNEE ARTHROPLASTY Left 05/14/2015   Procedure: LEFT TOTAL KNEE ARTHROPLASTY;  Surgeon: Samson Frederic, MD;  Location: [facility];  Service: Orthopedics;  Laterality: Left;  . TUBAL LIGATION      There were no vitals filed for this visit.   Subjective Assessment - 02/09/20 1049    Subjective States she is doing better. She is doing her exercises. States that she stopped taking the meloxicam as instructed by the MD and  her pain started back up. States she was at a travel ball game for her granddaughter and her leg really bothered her. States she feels the brace is helping. States that she doesn't want to put on compression stockings because she is so hot natured.    Pertinent History DB, L TKA 2016, R TKA 2018    Patient Stated Goals No surgery, stop wearing brace, reduce pain and improve gait.    Currently in Pain? Yes    Pain Score 1     Pain Location Ankle    Pain Orientation Left    Pain Descriptors / Indicators Aching              [facility] PT Assessment - 02/09/20 0001      Assessment   Medical Diagnosis dysfunction of post tibial tendon L    Referring Provider (PT) Alfredo Martinez PA                         [facility] Adult PT Treatment/Exercise - 02/09/20 0001      Ambulation/Gait   Ambulation/Gait Yes    Ambulation/Gait Assistance 6: Modified independent (Device/Increase time)    Ambulation Distance (Feet) 226 Feet    Assistive device Straight cane  Ambulation Surface Level;Indoor    Gait velocity decreased    Gait Comments focus on keeping feet straight with walking.       Ankle Exercises: Stretches   Gastroc Stretch 4 reps;30 seconds   L - cues to keep foot straight - with towel roll      Ankle Exercises: Seated   Other Seated Ankle Exercises DF with inversion and then eversion - PT assist to control knee ROM - 3x10 B                Balance Exercises - 02/09/20 0001      Balance Exercises: Standing   Standing Eyes Opened Narrow base of support (BOS);Foam/compliant surface;3 reps;30 secs   unable to get feet together due to knees, black foam    Tandem Stance Eyes open;Foam/compliant surface;Upper extremity support 1;30 secs;4 reps   black foam    Other Standing Exercises Comments foot taps on 2" step with 1 UE assist  3x10 B cue to keep left foot straight.              PT Education - 02/09/20 1120    Education Details on swelling, on compression stockings,  about pain with swelling.    Person(s) Educated Patient    Methods Explanation    Comprehension Verbalized understanding            PT Short Term Goals - 01/28/20 1058      PT SHORT TERM GOAL #1   Title Patient will be independent in self management strategies to improve quality of life and functional outcomes.    Time 3    Period Weeks    Status New    Target Date 02/18/20      PT SHORT TERM GOAL #2   Title Patient will report at least 25% improvement in overall symptoms and/or functional ability.    Time 3    Period Weeks    Status New    Target Date 02/18/20             PT Long Term Goals - 01/28/20 1059      PT LONG TERM GOAL #1   Title Patient will report at least 50% improvement in overall symptoms and/or functional ability.    Time 6    Period Weeks    Status New    Target Date 03/10/20      PT LONG TERM GOAL #2   Title Patient will be able to stand on left leg with 1 UE support for at least 10 seconds to demonstrate improved static balance.    Time 6    Period Weeks    Status New    Target Date 03/10/20      PT LONG TERM GOAL #3   Title Patient will score with at least 60% function on FOTO to demonstrate improved overall function.    Time 6    Period Weeks    Status New    Target Date 03/10/20                 Plan - 02/09/20 1104    Clinical Impression Statement Focused on balance and foot/ankle strengthening today. No increase in pain noted during today's session.  Limited active inversion noted but this improved with practice and verbal and tactile cues. Patient continues to have swelling in left lower leg with wearing brace but patient is firm about not wearing compression stockings secondary to being hot natured. Will continue to focus on balance  and arch/ankle strengthening as tolerated by patient.    Personal Factors and Comorbidities Comorbidity 1;Comorbidity 2;Age    Comorbidities DB, L TKA 2016, R TKA 2018    Examination-Activity  Limitations Stand;Stairs;Locomotion Level    Examination-Participation Restrictions Church;Cleaning;Community Activity;Laundry;Meal Prep    Stability/Clinical Decision Making Stable/Uncomplicated    Rehab Potential Good    PT Frequency 2x / week    PT Duration 6 weeks    PT Treatment/Interventions ADLs/Self Care Home Management;Aquatic Therapy;Electrical Stimulation;Iontophoresis 4mg /ml Dexamethasone;Moist Heat;Traction;Balance training;Therapeutic exercise;Therapeutic activities;Functional mobility training;Stair training;Gait training;DME Instruction;Neuromuscular re-education;Patient/family education;Manual techniques;Dry needling;Passive range of motion;Joint Manipulations;Cryotherapy;Taping    PT Next Visit Plan gastroc stretch, ankle strengtheing, hip strengthening, balance, arch strengthening.  Pt may benefit from orthotic referral to address fallen arch.    PT Home Exercise Plan 6/15:  towel curl, marble pick up, heel/toe raises seated and standing with support.           Patient will benefit from skilled therapeutic intervention in order to improve the following deficits and impairments:  Pain, Decreased balance, Decreased mobility, Difficulty walking, Hypermobility, Decreased range of motion, Abnormal gait, Decreased endurance, Increased edema, Decreased activity tolerance, Decreased knowledge of use of DME, Decreased strength  Visit Diagnosis: Difficulty in walking, not elsewhere classified  Pain in left ankle and joints of left foot  Pain in right ankle and joints of right foot     Problem List Patient Active Problem List   Diagnosis Date Noted  . Umbilical hernia 09/32/3557  . Osteoarthritis of right knee 05/28/2017  . Unspecified essential hypertension 03/03/2014    Class: History of  . Type 2 diabetes mellitus ([place]) 03/03/2014    Class: History of  . CONTRACTURE OF SHOULDER JOINT 12/03/2007  . SHOULDER PAIN 12/03/2007  . IMPINGEMENT SYNDROME 12/03/2007   11:26  AM, 02/09/20 Jerene Pitch, DPT Physical Therapy with [facility]  [phone] office  [place] [address] [place], [place], [address] Phone: [phone]   Fax:  [phone]  Name: Tracy Bonilla MRN: [id number] Date of Birth: 1950/12/07

## 2020-02-11 ENCOUNTER — Other Ambulatory Visit: Payer: Self-pay

## 2020-02-11 ENCOUNTER — Encounter (HOSPITAL_COMMUNITY): Payer: Self-pay | Admitting: Physical Therapy

## 2020-02-11 ENCOUNTER — Ambulatory Visit (HOSPITAL_COMMUNITY): Payer: Medicare Other | Admitting: Physical Therapy

## 2020-02-11 DIAGNOSIS — M25571 Pain in right ankle and joints of right foot: Secondary | ICD-10-CM

## 2020-02-11 DIAGNOSIS — M25572 Pain in left ankle and joints of left foot: Secondary | ICD-10-CM

## 2020-02-11 DIAGNOSIS — R262 Difficulty in walking, not elsewhere classified: Secondary | ICD-10-CM

## 2020-02-11 NOTE — Therapy (Signed)
[facility] [place], [place], [address] Phone: [phone]   Fax:  [phone]  Physical Therapy Treatment  Patient Details  Name: Tracy Bonilla MRN: [id number] Date of Birth: January 23, 1951 Referring Provider (PT): Mechele Claude PA   Encounter Date: 02/11/2020   PT End of Session - 02/11/20 1035    Visit Number 5    Number of Visits 12    Date for PT Re-Evaluation 03/10/20    Authorization Type medicare primary, [place] secondary, no auth    Progress Note Due on Visit 10    PT Start Time 1033    PT Stop Time 1113    PT Time Calculation (min) 40 min    Equipment Utilized During Treatment Other (comment)   brace and SPC   Activity Tolerance Patient tolerated treatment well    Behavior During Therapy [facility] for tasks assessed/performed           Past Medical History:  Diagnosis Date  . Arthritis   . Complication of anesthesia   . Diabetes mellitus without complication ([place])   . Hyperlipidemia   . Hypertension   . PONV (postoperative nausea and vomiting)     Past Surgical History:  Procedure Laterality Date  . arthroscopic surgery Left   . BREAST SURGERY Left    1998  . cataract surgery    . DILATION AND CURETTAGE OF UTERUS    . extra digit removed     thumb  . FOOT SURGERY Right   . KNEE ARTHROPLASTY Right 05/28/2017   Procedure: COMPUTER ASSISTED TOTAL KNEE ARTHROPLASTY;  Surgeon: Rod Can, MD;  Location: [facility];  Service: Orthopedics;  Laterality: Right;  . REFRACTIVE SURGERY     both eyes  . TOTAL KNEE ARTHROPLASTY Left 05/14/2015   Procedure: LEFT TOTAL KNEE ARTHROPLASTY;  Surgeon: Rod Can, MD;  Location: [facility];  Service: Orthopedics;  Laterality: Left;  . TUBAL LIGATION      There were no vitals filed for this visit.   Subjective Assessment - 02/11/20 1036    Subjective Patient says she is feeling better than last week. Says her ankle was flared up from being on her feet more over the weekends,  says she went to her granddaughter's ballgame. Denies current pain.    Pertinent History DB, L TKA 2016, R TKA 2018    Patient Stated Goals No surgery, stop wearing brace, reduce pain and improve gait.    Currently in Pain? No/denies                             [facility] Adult PT Treatment/Exercise - 02/11/20 0001      Ankle Exercises: Seated   BAPS Sitting;Level 1   20 x each FWD/ Lateral    Other Seated Ankle Exercises seated heel/toe raise x20, seated ankle tband 4 way RTB x 20 each       Ankle Exercises: Stretches   Gastroc Stretch 3 reps;30 seconds   with towel              Balance Exercises - 02/11/20 0001      Balance Exercises: Standing   Tandem Stance Eyes open;Foam/compliant surface;30 secs               PT Short Term Goals - 01/28/20 1058      PT SHORT TERM GOAL #1   Title Patient will be independent in self management strategies to improve  quality of life and functional outcomes.    Time 3    Period Weeks    Status New    Target Date 02/18/20      PT SHORT TERM GOAL #2   Title Patient will report at least 25% improvement in overall symptoms and/or functional ability.    Time 3    Period Weeks    Status New    Target Date 02/18/20             PT Long Term Goals - 01/28/20 1059      PT LONG TERM GOAL #1   Title Patient will report at least 50% improvement in overall symptoms and/or functional ability.    Time 6    Period Weeks    Status New    Target Date 03/10/20      PT LONG TERM GOAL #2   Title Patient will be able to stand on left leg with 1 UE support for at least 10 seconds to demonstrate improved static balance.    Time 6    Period Weeks    Status New    Target Date 03/10/20      PT LONG TERM GOAL #3   Title Patient will score with at least 60% function on FOTO to demonstrate improved overall function.    Time 6    Period Weeks    Status New    Target Date 03/10/20                 Plan - 02/11/20  1114    Clinical Impression Statement Patient tolerated session well today. Added ankle band 4 way for increased strengthening and ankle stabilization. Patient educated on proper form and function. Patient continues to be limited by decreased standing static balance impacted by decreased stability of LT ankle. Patient will continue to benefit from skilled therapy services to progress ankle strength and stabilization to improve functional mobility and reduce risk for falls.    Personal Factors and Comorbidities Comorbidity 1;Comorbidity 2;Age    Comorbidities DB, L TKA 2016, R TKA 2018    Examination-Activity Limitations Stand;Stairs;Locomotion Level    Examination-Participation Restrictions Church;Cleaning;Community Activity;Laundry;Meal Prep    Stability/Clinical Decision Making Stable/Uncomplicated    Rehab Potential Good    PT Frequency 2x / week    PT Duration 6 weeks    PT Treatment/Interventions ADLs/Self Care Home Management;Aquatic Therapy;Electrical Stimulation;Iontophoresis 4mg /ml Dexamethasone;Moist Heat;Traction;Balance training;Therapeutic exercise;Therapeutic activities;Functional mobility training;Stair training;Gait training;DME Instruction;Neuromuscular re-education;Patient/family education;Manual techniques;Dry needling;Passive range of motion;Joint Manipulations;Cryotherapy;Taping    PT Next Visit Plan gastroc stretch, ankle strengtheing, hip strengthening, balance, arch strengthening.  Pt may benefit from orthotic referral to address fallen arch.    PT Home Exercise Plan 6/15:  towel curl, marble pick up, heel/toe raises seated and standing with support. 02/11/20: ankle band 4 way    Consulted and Agree with Plan of Care Patient           Patient will benefit from skilled therapeutic intervention in order to improve the following deficits and impairments:  Pain, Decreased balance, Decreased mobility, Difficulty walking, Hypermobility, Decreased range of motion, Abnormal gait,  Decreased endurance, Increased edema, Decreased activity tolerance, Decreased knowledge of use of DME, Decreased strength  Visit Diagnosis: Difficulty in walking, not elsewhere classified  Pain in left ankle and joints of left foot  Pain in right ankle and joints of right foot     Problem List Patient Active Problem List   Diagnosis Date Noted  . Umbilical hernia  02/27/2018  . Osteoarthritis of right knee 05/28/2017  . Unspecified essential hypertension 03/03/2014    Class: History of  . Type 2 diabetes mellitus (HCC) 03/03/2014    Class: History of  . CONTRACTURE OF SHOULDER JOINT 12/03/2007  . SHOULDER PAIN 12/03/2007  . IMPINGEMENT SYNDROME 12/03/2007   11:16 AM, 02/11/20 Georges Lynch PT DPT  Physical Therapist with [place]  [facility]  [phone]   [facility] [address] [place], [place], [address] Phone: [phone]   Fax:  [phone]  Name: Tracy Bonilla MRN: [id number] Date of Birth: 05-21-1951

## 2020-02-11 NOTE — Patient Instructions (Signed)
Access Code: [email or web address] URL: [email or web address] Date: 02/11/2020 Prepared by: Georges Lynch  Exercises Seated Ankle Inversion with Resistance and Legs Crossed - 2 x daily - 7 x weekly - 2 sets - 10 reps Seated Ankle Eversion with Resistance - 2 x daily - 7 x weekly - 2 sets - 10 reps Seated Ankle Dorsiflexion with Resistance - 2 x daily - 7 x weekly - 2 sets - 10 reps Seated Ankle Plantarflexion with Resistance - 2 x daily - 7 x weekly - 2 sets - 10 reps

## 2020-02-17 ENCOUNTER — Other Ambulatory Visit: Payer: Self-pay

## 2020-02-17 ENCOUNTER — Encounter (HOSPITAL_COMMUNITY): Payer: Self-pay | Admitting: Physical Therapy

## 2020-02-17 ENCOUNTER — Ambulatory Visit (HOSPITAL_COMMUNITY): Payer: Medicare Other | Admitting: Physical Therapy

## 2020-02-17 DIAGNOSIS — M25572 Pain in left ankle and joints of left foot: Secondary | ICD-10-CM | POA: Diagnosis not present

## 2020-02-17 DIAGNOSIS — R262 Difficulty in walking, not elsewhere classified: Secondary | ICD-10-CM

## 2020-02-17 DIAGNOSIS — M25571 Pain in right ankle and joints of right foot: Secondary | ICD-10-CM

## 2020-02-17 NOTE — Therapy (Signed)
[facility] [address] [place], [place], [address] Phone: [phone]   Fax:  [phone]  Physical Therapy Treatment  Patient Details  Name: Tracy Bonilla MRN: [id number] Date of Birth: Aug 07, 1951 Referring Provider (PT): Alfredo Martinez PA   Encounter Date: 02/17/2020   PT End of Session - 02/17/20 1357    Visit Number 6    Number of Visits 12    Date for PT Re-Evaluation 03/10/20    Authorization Type medicare primary, [place] secondary, no auth    Progress Note Due on Visit 10    PT Start Time 1400    PT Stop Time 1440    PT Time Calculation (min) 40 min    Equipment Utilized During Treatment Other (comment)   brace and SPC   Activity Tolerance Patient tolerated treatment well    Behavior During Therapy [facility] for tasks assessed/performed           Past Medical History:  Diagnosis Date   Arthritis    Complication of anesthesia    Diabetes mellitus without complication (HCC)    Hyperlipidemia    Hypertension    PONV (postoperative nausea and vomiting)     Past Surgical History:  Procedure Laterality Date   arthroscopic surgery Left    BREAST SURGERY Left    1998   cataract surgery     DILATION AND CURETTAGE OF UTERUS     extra digit removed     thumb   FOOT SURGERY Right    KNEE ARTHROPLASTY Right 05/28/2017   Procedure: COMPUTER ASSISTED TOTAL KNEE ARTHROPLASTY;  Surgeon: Samson Frederic, MD;  Location: [facility] OR;  Service: Orthopedics;  Laterality: Right;   REFRACTIVE SURGERY     both eyes   TOTAL KNEE ARTHROPLASTY Left 05/14/2015   Procedure: LEFT TOTAL KNEE ARTHROPLASTY;  Surgeon: Samson Frederic, MD;  Location: [facility];  Service: Orthopedics;  Laterality: Left;   TUBAL LIGATION      There were no vitals filed for this visit.   Subjective Assessment - 02/17/20 1402    Subjective States she did too much yesterday and she didnt feel good this morning but now she feels pretty good. States she felt like she  was dragging this morning and her left foot was sore.    Pertinent History DB, L TKA 2016, R TKA 2018    Currently in Pain? No/denies              [facility] PT Assessment - 02/17/20 0001      Assessment   Medical Diagnosis dysfunction of post tibial tendon L    Referring Provider (PT) Alfredo Martinez PA    Next MD Visit July 2nd 2021                         [facility] Adult PT Treatment/Exercise - 02/17/20 0001      Ankle Exercises: Seated   BAPS Sitting;Level 1;15 reps   DF/PF, lateral, circles -x2, cues to decrease knee ROM   Other Seated Ankle Exercises seated heel/toe raises - focus on keeping foot straight x25    Other Seated Ankle Exercises foot windshield wipers seated - 2x15       Ankle Exercises: Stretches   Gastroc Stretch 3 reps;30 seconds   with strap              Balance Exercises - 02/17/20 0001      Balance Exercises: Standing   Standing Eyes Opened  Narrow base of support (BOS);Foam/compliant surface;3 reps;30 secs    Tandem Stance Eyes open;Foam/compliant surface;30 secs;4 reps   B, black foam, 1 UE assist - unable without UE assist    SLS with Vectors Solid surface;Upper extremity assist 2   4x5 B            PT Education - 02/17/20 1425    Education Details answered all questions about concerns of foot and about upcoming MD visit.    Person(s) Educated Patient    Methods Explanation    Comprehension Verbalized understanding            PT Short Term Goals - 01/28/20 1058      PT SHORT TERM GOAL #1   Title Patient will be independent in self management strategies to improve quality of life and functional outcomes.    Time 3    Period Weeks    Status New    Target Date 02/18/20      PT SHORT TERM GOAL #2   Title Patient will report at least 25% improvement in overall symptoms and/or functional ability.    Time 3    Period Weeks    Status New    Target Date 02/18/20             PT Long Term Goals - 01/28/20 1059      PT  LONG TERM GOAL #1   Title Patient will report at least 50% improvement in overall symptoms and/or functional ability.    Time 6    Period Weeks    Status New    Target Date 03/10/20      PT LONG TERM GOAL #2   Title Patient will be able to stand on left leg with 1 UE support for at least 10 seconds to demonstrate improved static balance.    Time 6    Period Weeks    Status New    Target Date 03/10/20      PT LONG TERM GOAL #3   Title Patient will score with at least 60% function on FOTO to demonstrate improved overall function.    Time 6    Period Weeks    Status New    Target Date 03/10/20                 Plan - 02/17/20 1357    Clinical Impression Statement Patient continues to demonstrate difficulties with ankle inversion and PF though this is improving from previous sessions. Continued swelling noted in foot/ankle but patient not interested in compression stockings at this time. Will reassess next session right before MD visit on Friday.    Personal Factors and Comorbidities Comorbidity 1;Comorbidity 2;Age    Comorbidities DB, L TKA 2016, R TKA 2018    Examination-Activity Limitations Stand;Stairs;Locomotion Level    Examination-Participation Restrictions Church;Cleaning;Community Activity;Laundry;Meal Prep    Stability/Clinical Decision Making Stable/Uncomplicated    Rehab Potential Good    PT Frequency 2x / week    PT Duration 6 weeks    PT Treatment/Interventions ADLs/Self Care Home Management;Aquatic Therapy;Electrical Stimulation;Iontophoresis 4mg /ml Dexamethasone;Moist Heat;Traction;Balance training;Therapeutic exercise;Therapeutic activities;Functional mobility training;Stair training;Gait training;DME Instruction;Neuromuscular re-education;Patient/family education;Manual techniques;Dry needling;Passive range of motion;Joint Manipulations;Cryotherapy;Taping    PT Next Visit Plan gastroc stretch, ankle strengtheing, hip strengthening, balance, arch strengthening.   Pt may benefit from orthotic referral to address fallen arch.    PT Home Exercise Plan 6/15:  towel curl, marble pick up, heel/toe raises seated and standing with support. 02/11/20: ankle band 4 way  Consulted and Agree with Plan of Care Patient           Patient will benefit from skilled therapeutic intervention in order to improve the following deficits and impairments:  Pain, Decreased balance, Decreased mobility, Difficulty walking, Hypermobility, Decreased range of motion, Abnormal gait, Decreased endurance, Increased edema, Decreased activity tolerance, Decreased knowledge of use of DME, Decreased strength  Visit Diagnosis: Difficulty in walking, not elsewhere classified  Pain in left ankle and joints of left foot  Pain in right ankle and joints of right foot     Problem List Patient Active Problem List   Diagnosis Date Noted   Umbilical hernia 02/27/2018   Osteoarthritis of right knee 05/28/2017   Unspecified essential hypertension 03/03/2014    Class: History of   Type 2 diabetes mellitus (HCC) 03/03/2014    Class: History of   CONTRACTURE OF SHOULDER JOINT 12/03/2007   SHOULDER PAIN 12/03/2007   IMPINGEMENT SYNDROME 12/03/2007   2:42 PM, 02/17/20 Tereasa Coop, DPT Physical Therapy with [facility]  [phone] office  [facility] [address] [place], [place], [address] Phone: [phone]   Fax:  [phone]  Name: Tracy Bonilla MRN: [id number] Date of Birth: Sep 15, 1950

## 2020-02-18 DIAGNOSIS — E1165 Type 2 diabetes mellitus with hyperglycemia: Secondary | ICD-10-CM | POA: Diagnosis not present

## 2020-02-18 DIAGNOSIS — E7849 Other hyperlipidemia: Secondary | ICD-10-CM | POA: Diagnosis not present

## 2020-02-18 DIAGNOSIS — M1712 Unilateral primary osteoarthritis, left knee: Secondary | ICD-10-CM | POA: Diagnosis not present

## 2020-02-18 DIAGNOSIS — I10 Essential (primary) hypertension: Secondary | ICD-10-CM | POA: Diagnosis not present

## 2020-02-19 ENCOUNTER — Ambulatory Visit (HOSPITAL_COMMUNITY): Payer: Medicare Other | Attending: Student | Admitting: Physical Therapy

## 2020-02-19 ENCOUNTER — Other Ambulatory Visit: Payer: Self-pay

## 2020-02-19 DIAGNOSIS — M25571 Pain in right ankle and joints of right foot: Secondary | ICD-10-CM | POA: Insufficient documentation

## 2020-02-19 DIAGNOSIS — M25572 Pain in left ankle and joints of left foot: Secondary | ICD-10-CM | POA: Insufficient documentation

## 2020-02-19 DIAGNOSIS — R262 Difficulty in walking, not elsewhere classified: Secondary | ICD-10-CM | POA: Diagnosis not present

## 2020-02-19 NOTE — Therapy (Signed)
[facility] [address] [place], [place], [address] Phone: [phone]   Fax:  [phone]  Physical Therapy Treatment  Patient Details  Name: Tracy Bonilla MRN: [id number] Date of Birth: 1951-03-18 Referring Provider (PT): Alfredo Martinez PA  Progress Note Reporting Period 01/28/2020  to 02/19/2020  See note below for Objective Data and Assessment of Progress/Goals.      Encounter Date: 02/19/2020   PT End of Session - 02/19/20 1406    Visit Number 7    Number of Visits 16    Date for PT Re-Evaluation 03/10/20    Authorization Type medicare primary, [place] secondary, no auth    Progress Note Due on Visit 16    PT Start Time 1315    PT Stop Time 1400    PT Time Calculation (min) 45 min    Equipment Utilized During Treatment Other (comment)   brace and SPC   Activity Tolerance Patient tolerated treatment well    Behavior During Therapy WFL for tasks assessed/performed           Past Medical History:  Diagnosis Date  . Arthritis   . Complication of anesthesia   . Diabetes mellitus without complication (HCC)   . Hyperlipidemia   . Hypertension   . PONV (postoperative nausea and vomiting)     Past Surgical History:  Procedure Laterality Date  . arthroscopic surgery Left   . BREAST SURGERY Left    1998  . cataract surgery    . DILATION AND CURETTAGE OF UTERUS    . extra digit removed     thumb  . FOOT SURGERY Right   . KNEE ARTHROPLASTY Right 05/28/2017   Procedure: COMPUTER ASSISTED TOTAL KNEE ARTHROPLASTY;  Surgeon: Samson Frederic, MD;  Location: [facility] OR;  Service: Orthopedics;  Laterality: Right;  . REFRACTIVE SURGERY     both eyes  . TOTAL KNEE ARTHROPLASTY Left 05/14/2015   Procedure: LEFT TOTAL KNEE ARTHROPLASTY;  Surgeon: Samson Frederic, MD;  Location: [facility];  Service: Orthopedics;  Laterality: Left;  . TUBAL LIGATION      There were no vitals filed for this visit.   Subjective Assessment - 02/19/20 1406     Subjective Pt states that her ankle brace is hot.  She is taking it off in the home.  She goes to the MD tomorrow    Pertinent History DB, L TKA 2016, R TKA 2018    Patient Stated Goals No surgery, stop wearing brace, reduce pain and improve gait.    Pain Onset More than a month ago              [facility] PT Assessment - 02/19/20 0001      Assessment   Medical Diagnosis dysfunction of post tibial tendon L    Referring Provider (PT) Alfredo Martinez PA    Next MD Visit July 2nd 2021    Prior Therapy yes for her knees      Restrictions   Weight Bearing Restrictions No      Home Environment   Living Environment Private residence    Living Arrangements Spouse/significant other    Available Help at Discharge Family    Type of Home House    Home Access Stairs to enter    Entrance Stairs-Number of Steps 2    Home Layout Laundry or work area in basement;Two level    Home Equipment Cane - single point      Prior Function   Level of  Independence Independent      Cognition   Overall Cognitive Status Within Functional Limits for tasks assessed      Observation/Other Assessments   Observations swelling in bialteral lower legs not in feet; left ankle rest in about 20 degrees of eversion, right ankle rests in neutral; left arch and calcaneus collapses in standing.     Focus on Therapeutic Outcomes (FOTO)  56% limited      Observation/Other Assessments-Edema    Edema Figure 8      Figure 8 Edema   Figure 8 - Right  48cm    Figure 8 - Left  49.5 cm      AROM   Right Ankle Dorsiflexion 14   was 2   Right Ankle Plantar Flexion 50    Right Ankle Inversion 30    Right Ankle Eversion 5    Left Ankle Dorsiflexion 10   everts foot; was 0    Left Ankle Plantar Flexion 50   was 45   Left Ankle Inversion 10   keeps foot at 12 degrees eversion can take foot to neutral    Left Ankle Eversion 15      Strength   Right Ankle Dorsiflexion 5/5    Right Ankle Plantar Flexion 4+/5    Right Ankle  Inversion 4-/5    Right Ankle Eversion 5/5    Left Ankle Dorsiflexion 4+/5    Left Ankle Plantar Flexion 4+/5    Left Ankle Inversion 2/5    Left Ankle Eversion 4+/5      Palpation   Palpation comment --      Ambulation/Gait   Ambulation/Gait --    Ambulation/Gait Assistance --    Ambulation Distance (Feet) --    Assistive device --    Gait Pattern --    Gait velocity --    Gait Comments --      Balance   Balance Assessed --                         [facility] Adult PT Treatment/Exercise - 02/19/20 0001      Exercises   Exercises Ankle      Ankle Exercises: Standing   SLS x3 with 1 hand hols     Heel Raises 10 reps    Toe Raise 10 reps      Ankle Exercises: Seated   Towel Crunch 1 rep    Heel Raises --    Heel Raises Limitations --    Toe Raise --    Toe Raise Limitations --    BAPS Sitting;Level 3;10 reps   heel toe/in/eversionand around the clock    Other Seated Ankle Exercises --    Other Seated Ankle Exercises foot windshield wipers seated - 2x15       Ankle Exercises: Supine   Isometrics x 10 for eversion                     PT Short Term Goals - 02/19/20 1409      PT SHORT TERM GOAL #1   Title Patient will be independent in self management strategies to improve quality of life and functional outcomes.    Time 3    Period Weeks    Status New    Target Date 02/18/20      PT SHORT TERM GOAL #2   Title Patient will report at least 25% improvement in overall symptoms and/or functional ability.    Time  3    Period Weeks    Status On-going    Target Date 02/18/20             PT Long Term Goals - 02/19/20 1409      PT LONG TERM GOAL #1   Title Patient will report at least 50% improvement in overall symptoms and/or functional ability.    Time 6    Period Weeks    Status On-going      PT LONG TERM GOAL #2   Title Patient will be able to stand on left leg with 1 UE support for at least 10 seconds to demonstrate improved  static balance.    Time 6    Period Weeks    Status On-going      PT LONG TERM GOAL #3   Title Patient will score with at least 60% function on FOTO to demonstrate improved overall function.    Time 6    Period Weeks    Status On-going                 Plan - 02/19/20 1407    Clinical Impression Statement PT reassessed for MD appointment.  She has improved with her ROM but has significant weakness especially her evertors.  PT continues to need skilled PT to increase strength as well as balance.  Recommending continued therapy for two additional weeks for a total of 16 visit    Personal Factors and Comorbidities Comorbidity 1;Comorbidity 2;Age    Comorbidities DB, L TKA 2016, R TKA 2018    Examination-Activity Limitations Stand;Stairs;Locomotion Level    Examination-Participation Restrictions Church;Cleaning;Community Activity;Laundry;Meal Prep    Stability/Clinical Decision Making Stable/Uncomplicated    Rehab Potential Good    PT Frequency 2x / week    PT Duration 8 weeks    PT Treatment/Interventions ADLs/Self Care Home Management;Aquatic Therapy;Electrical Stimulation;Iontophoresis 4mg /ml Dexamethasone;Moist Heat;Traction;Balance training;Therapeutic exercise;Therapeutic activities;Functional mobility training;Stair training;Gait training;DME Instruction;Neuromuscular re-education;Patient/family education;Manual techniques;Dry needling;Passive range of motion;Joint Manipulations;Cryotherapy;Taping    PT Next Visit Plan gastroc stretch, ankle strengtheing, hip strengthening, balance, arch strengthening.  Pt may benefit from orthotic referral to address fallen arch.    PT Home Exercise Plan 6/15:  towel curl, marble pick up, heel/toe raises seated and standing with support. 02/11/20: ankle band 4 way; 7/1: isometric, standing heel and toe raises as well as SLS    Consulted and Agree with Plan of Care Patient           Patient will benefit from skilled therapeutic intervention in  order to improve the following deficits and impairments:  Pain, Decreased balance, Decreased mobility, Difficulty walking, Hypermobility, Decreased range of motion, Abnormal gait, Decreased endurance, Increased edema, Decreased activity tolerance, Decreased knowledge of use of DME, Decreased strength  Visit Diagnosis: Difficulty in walking, not elsewhere classified  Pain in left ankle and joints of left foot  Pain in right ankle and joints of right foot     Problem List Patient Active Problem List   Diagnosis Date Noted  . Umbilical hernia 02/27/2018  . Osteoarthritis of right knee 05/28/2017  . Unspecified essential hypertension 03/03/2014    Class: History of  . Type 2 diabetes mellitus (HCC) 03/03/2014    Class: History of  . CONTRACTURE OF SHOULDER JOINT 12/03/2007  . SHOULDER PAIN 12/03/2007  . IMPINGEMENT SYNDROME 12/03/2007    Virgina Organynthia Ezme Duch, PT CLT [phone] 02/19/2020, 2:11 PM  [facility] [address] [place], [place], [address] Phone: [phone]  Fax:  [phone]  Name: Tracy Bonilla MRN: [id number] Date of Birth: June 20, 1951

## 2020-02-25 ENCOUNTER — Other Ambulatory Visit: Payer: Self-pay

## 2020-02-25 ENCOUNTER — Encounter (HOSPITAL_COMMUNITY): Payer: Self-pay | Admitting: Physical Therapy

## 2020-02-25 ENCOUNTER — Ambulatory Visit (HOSPITAL_COMMUNITY): Payer: Medicare Other | Admitting: Physical Therapy

## 2020-02-25 DIAGNOSIS — M25572 Pain in left ankle and joints of left foot: Secondary | ICD-10-CM | POA: Diagnosis not present

## 2020-02-25 DIAGNOSIS — R262 Difficulty in walking, not elsewhere classified: Secondary | ICD-10-CM | POA: Diagnosis not present

## 2020-02-25 DIAGNOSIS — M25571 Pain in right ankle and joints of right foot: Secondary | ICD-10-CM

## 2020-02-25 NOTE — Therapy (Signed)
[facility] [address] [place], [place], [address] Phone: [phone]   Fax:  [phone]  Physical Therapy Treatment  Patient Details  Name: Tracy Bonilla MRN: [id number] Date of Birth: Sep 18, 1950 Referring Provider (PT): Alfredo Martinez PA   Encounter Date: 02/25/2020   PT End of Session - 02/25/20 1535    Visit Number 8    Number of Visits 16    Date for PT Re-Evaluation 03/10/20    Authorization Type medicare primary, [place] secondary, no auth    Progress Note Due on Visit 16    PT Start Time 1514    PT Stop Time 1554    PT Time Calculation (min) 40 min    Equipment Utilized During Treatment Other (comment)   brace and SPC   Activity Tolerance Patient tolerated treatment well    Behavior During Therapy [facility] for tasks assessed/performed           Past Medical History:  Diagnosis Date   Arthritis    Complication of anesthesia    Diabetes mellitus without complication (HCC)    Hyperlipidemia    Hypertension    PONV (postoperative nausea and vomiting)     Past Surgical History:  Procedure Laterality Date   arthroscopic surgery Left    BREAST SURGERY Left    1998   cataract surgery     DILATION AND CURETTAGE OF UTERUS     extra digit removed     thumb   FOOT SURGERY Right    KNEE ARTHROPLASTY Right 05/28/2017   Procedure: COMPUTER ASSISTED TOTAL KNEE ARTHROPLASTY;  Surgeon: Samson Frederic, MD;  Location: [facility] OR;  Service: Orthopedics;  Laterality: Right;   REFRACTIVE SURGERY     both eyes   TOTAL KNEE ARTHROPLASTY Left 05/14/2015   Procedure: LEFT TOTAL KNEE ARTHROPLASTY;  Surgeon: Samson Frederic, MD;  Location: [facility];  Service: Orthopedics;  Laterality: Left;   TUBAL LIGATION      There were no vitals filed for this visit.   Subjective Assessment - 02/25/20 1515    Subjective Patient reported she is doing well. She stated that she saw the MD and that she is planning to get custom orthotics next week.  She reported that she did get a shot in her anterior ankle and she feels like it has helped a little. Patient reported that she dropped her body wash and it hit her toe and it left a bruise.    Pertinent History DB, L TKA 2016, R TKA 2018    Patient Stated Goals No surgery, stop wearing brace, reduce pain and improve gait.    Currently in Pain? Yes    Pain Score 2     Pain Location Ankle    Pain Orientation Left;Lateral    Pain Descriptors / Indicators Aching    Pain Type Chronic pain    Pain Onset More than a month ago                             [facility] Adult PT Treatment/Exercise - 02/25/20 0001      Exercises   Exercises Ankle      Ankle Exercises: Standing   Heel Raises 10 reps    Toe Raise 10 reps    Other Standing Ankle Exercises tandem stance 3x 30"      Ankle Exercises: Seated   Towel Crunch --   10 reps   Heel Raises Both  25   Toe Raise --   25   BAPS Sitting;Level 3;10 reps   Heel/toe, Inv/ev, CW/CCW     Ankle Exercises: Supine   Isometrics 10'' holds x 10 for eversion                     PT Short Term Goals - 02/19/20 1409      PT SHORT TERM GOAL #1   Title Patient will be independent in self management strategies to improve quality of life and functional outcomes.    Time 3    Period Weeks    Status New    Target Date 02/18/20      PT SHORT TERM GOAL #2   Title Patient will report at least 25% improvement in overall symptoms and/or functional ability.    Time 3    Period Weeks    Status On-going    Target Date 02/18/20             PT Long Term Goals - 02/19/20 1409      PT LONG TERM GOAL #1   Title Patient will report at least 50% improvement in overall symptoms and/or functional ability.    Time 6    Period Weeks    Status On-going      PT LONG TERM GOAL #2   Title Patient will be able to stand on left leg with 1 UE support for at least 10 seconds to demonstrate improved static balance.    Time 6    Period  Weeks    Status On-going      PT LONG TERM GOAL #3   Title Patient will score with at least 60% function on FOTO to demonstrate improved overall function.    Time 6    Period Weeks    Status On-going                 Plan - 02/25/20 1554    Clinical Impression Statement Continued with established POC this session. Patient required cueing to perform BAPS board correctly without compensations through the knee. Resumed tandem stance this session with patient requiring intermittent UE assist. Educated patient on the purpose and technique of interventions throughout the session. Patient denied any increase in pain throughout the session.    Personal Factors and Comorbidities Comorbidity 1;Comorbidity 2;Age    Comorbidities DB, L TKA 2016, R TKA 2018    Examination-Activity Limitations Stand;Stairs;Locomotion Level    Examination-Participation Restrictions Church;Cleaning;Community Activity;Laundry;Meal Prep    Stability/Clinical Decision Making Stable/Uncomplicated    Rehab Potential Good    PT Frequency 2x / week    PT Duration 8 weeks    PT Treatment/Interventions ADLs/Self Care Home Management;Aquatic Therapy;Electrical Stimulation;Iontophoresis 4mg /ml Dexamethasone;Moist Heat;Traction;Balance training;Therapeutic exercise;Therapeutic activities;Functional mobility training;Stair training;Gait training;DME Instruction;Neuromuscular re-education;Patient/family education;Manual techniques;Dry needling;Passive range of motion;Joint Manipulations;Cryotherapy;Taping    PT Next Visit Plan gastroc stretch, ankle strengtheing, hip strengthening, balance, arch strengthening.  Pt planning to get orthotic for fallen arch.    PT Home Exercise Plan 6/15:  towel curl, marble pick up, heel/toe raises seated and standing with support. 02/11/20: ankle band 4 way; 7/1: isometric, standing heel and toe raises as well as SLS    Consulted and Agree with Plan of Care Patient           Patient will benefit  from skilled therapeutic intervention in order to improve the following deficits and impairments:  Pain, Decreased balance, Decreased mobility, Difficulty walking, Hypermobility, Decreased range of motion, Abnormal  gait, Decreased endurance, Increased edema, Decreased activity tolerance, Decreased knowledge of use of DME, Decreased strength  Visit Diagnosis: Difficulty in walking, not elsewhere classified  Pain in left ankle and joints of left foot  Pain in right ankle and joints of right foot     Problem List Patient Active Problem List   Diagnosis Date Noted   Umbilical hernia 02/27/2018   Osteoarthritis of right knee 05/28/2017   Unspecified essential hypertension 03/03/2014    Class: History of   Type 2 diabetes mellitus (HCC) 03/03/2014    Class: History of   CONTRACTURE OF SHOULDER JOINT 12/03/2007   SHOULDER PAIN 12/03/2007   IMPINGEMENT SYNDROME 12/03/2007   Verne Carrow PT, DPT 3:56 PM, 02/25/20 [phone]  [facility] [address] [place], [place], [address] Phone: [phone]   Fax:  [phone]  Name: Tracy Bonilla MRN: [id number] Date of Birth: 30-Oct-1950

## 2020-02-27 ENCOUNTER — Encounter (HOSPITAL_COMMUNITY): Payer: Self-pay

## 2020-02-27 ENCOUNTER — Ambulatory Visit (HOSPITAL_COMMUNITY): Payer: Medicare Other

## 2020-02-27 ENCOUNTER — Other Ambulatory Visit: Payer: Self-pay

## 2020-02-27 DIAGNOSIS — R262 Difficulty in walking, not elsewhere classified: Secondary | ICD-10-CM

## 2020-02-27 DIAGNOSIS — M25571 Pain in right ankle and joints of right foot: Secondary | ICD-10-CM | POA: Diagnosis not present

## 2020-02-27 DIAGNOSIS — M25572 Pain in left ankle and joints of left foot: Secondary | ICD-10-CM | POA: Diagnosis not present

## 2020-02-27 NOTE — Therapy (Signed)
[facility] [address] [place], [place], [address] Phone: [phone]   Fax:  [phone]  Physical Therapy Treatment  Patient Details  Name: Tracy Bonilla MRN: [id number] Date of Birth: [birth year] Referring Provider (PT): Alfredo Martinez PA   Encounter Date: 02/27/2020   PT End of Session - 02/27/20 1124    Visit Number 9    Number of Visits 16    Date for PT Re-Evaluation 03/10/20    Authorization Type medicare primary, [place] secondary, no auth    Progress Note Due on Visit 16    PT Start Time 1118    PT Stop Time 1159    PT Time Calculation (min) 41 min    Equipment Utilized During Treatment Other (comment)   brace and SPC (brace removed during session)   Activity Tolerance Patient tolerated treatment well    Behavior During Therapy [facility] for tasks assessed/performed           Past Medical History:  Diagnosis Date  . Arthritis   . Complication of anesthesia   . Diabetes mellitus without complication (HCC)   . Hyperlipidemia   . Hypertension   . PONV (postoperative nausea and vomiting)     Past Surgical History:  Procedure Laterality Date  . arthroscopic surgery Left   . BREAST SURGERY Left    1998  . cataract surgery    . DILATION AND CURETTAGE OF UTERUS    . extra digit removed     thumb  . FOOT SURGERY Right   . KNEE ARTHROPLASTY Right 05/28/2017   Procedure: COMPUTER ASSISTED TOTAL KNEE ARTHROPLASTY;  Surgeon: Samson Frederic, MD;  Location: [facility] OR;  Service: Orthopedics;  Laterality: Right;  . REFRACTIVE SURGERY     both eyes  . TOTAL KNEE ARTHROPLASTY Left 05/14/2015   Procedure: LEFT TOTAL KNEE ARTHROPLASTY;  Surgeon: Samson Frederic, MD;  Location: [facility];  Service: Orthopedics;  Laterality: Left;  . TUBAL LIGATION      There were no vitals filed for this visit.   Subjective Assessment - 02/27/20 1121    Subjective Pt stated she is feeling good today.  Has bruise on 2nd shoe from dropping the body wash on it.   Reports she is preparing for orthortic referral for arch support.    Pertinent History DB, L TKA 2016, R TKA 2018    Currently in Pain? No/denies              [facility] PT Assessment - 02/27/20 0001      Assessment   Medical Diagnosis dysfunction of post tibial tendon L    Referring Provider (PT) Alfredo Martinez PA    Next MD Visit 03/08/20 for orthothic arch support; 2 months MD Veleta Miners    Prior Therapy yes for her knees                         [facility] Adult PT Treatment/Exercise - 02/27/20 0001      Exercises   Exercises Ankle      Ankle Exercises: Standing   Vector Stance Right;Left;3 reps;5 seconds    Vector Stance Limitations 1 HHA    SLS 3x 30" with 1 hand hold assistance    Heel Raises 10 reps    Toe Raise 10 reps    Other Standing Ankle Exercises tandem stance 3x 30"      Ankle Exercises: Seated   BAPS Sitting;Level 3;10 reps    Other Seated Ankle  Exercises toe extension 10x 10"    Other Seated Ankle Exercises foot windshield wipers seated - 2x15       Ankle Exercises: Supine   Isometrics 10'' holds x 10 for eversion                     PT Short Term Goals - 02/19/20 1409      PT SHORT TERM GOAL #1   Title Patient will be independent in self management strategies to improve quality of life and functional outcomes.    Time 3    Period Weeks    Status New    Target Date 02/18/20      PT SHORT TERM GOAL #2   Title Patient will report at least 25% improvement in overall symptoms and/or functional ability.    Time 3    Period Weeks    Status On-going    Target Date 02/18/20             PT Long Term Goals - 02/19/20 1409      PT LONG TERM GOAL #1   Title Patient will report at least 50% improvement in overall symptoms and/or functional ability.    Time 6    Period Weeks    Status On-going      PT LONG TERM GOAL #2   Title Patient will be able to stand on left leg with 1 UE support for at least 10 seconds to demonstrate improved  static balance.    Time 6    Period Weeks    Status On-going      PT LONG TERM GOAL #3   Title Patient will score with at least 60% function on FOTO to demonstrate improved overall function.    Time 6    Period Weeks    Status On-going                 Plan - 02/27/20 1248    Clinical Impression Statement Continued with established POC this session focusing on ankle strengthening and balance training.  Added seated toe extension to assist with arch formation and resumed vector stance to assist with hip strengthening with SLS activities.  Pt continues to require intermittent HHA during SLS and tandem stance due to weakness and instability with NBOS.  No reports of pain through session, was limited by fatigue.    Personal Factors and Comorbidities Comorbidity 1;Comorbidity 2;Age    Comorbidities DB, L TKA 2016, R TKA 2018    Examination-Activity Limitations Stand;Stairs;Locomotion Level    Examination-Participation Restrictions Church;Cleaning;Community Activity;Laundry;Meal Prep    Stability/Clinical Decision Making Stable/Uncomplicated    Clinical Decision Making Low    Rehab Potential Good    PT Frequency 2x / week    PT Duration 8 weeks    PT Treatment/Interventions ADLs/Self Care Home Management;Aquatic Therapy;Electrical Stimulation;Iontophoresis 4mg /ml Dexamethasone;Moist Heat;Traction;Balance training;Therapeutic exercise;Therapeutic activities;Functional mobility training;Stair training;Gait training;DME Instruction;Neuromuscular re-education;Patient/family education;Manual techniques;Dry needling;Passive range of motion;Joint Manipulations;Cryotherapy;Taping    PT Next Visit Plan gastroc stretch, ankle strengtheing, hip strengthening, balance, arch strengthening.  Pt planning to get orthotic for fallen arch.    PT Home Exercise Plan 6/15:  towel curl, marble pick up, heel/toe raises seated and standing with support. 02/11/20: ankle band 4 way; 7/1: isometric, standing heel and  toe raises as well as SLS           Patient will benefit from skilled therapeutic intervention in order to improve the following deficits and impairments:  Pain, Decreased balance,  Decreased mobility, Difficulty walking, Hypermobility, Decreased range of motion, Abnormal gait, Decreased endurance, Increased edema, Decreased activity tolerance, Decreased knowledge of use of DME, Decreased strength  Visit Diagnosis: Pain in right ankle and joints of right foot  Pain in left ankle and joints of left foot  Difficulty in walking, not elsewhere classified     Problem List Patient Active Problem List   Diagnosis Date Noted  . Umbilical hernia 02/27/2018  . Osteoarthritis of right knee 05/28/2017  . Unspecified essential hypertension 03/03/2014    Class: History of  . Type 2 diabetes mellitus (HCC) 03/03/2014    Class: History of  . CONTRACTURE OF SHOULDER JOINT 12/03/2007  . SHOULDER PAIN 12/03/2007  . IMPINGEMENT SYNDROME 12/03/2007   Becky Sax, LPTA/CLT; CBIS [phone]  Juel Burrow 02/27/2020, 12:52 PM  [facility] [address] [place], [place], [address] Phone: [phone]   Fax:  [phone]  Name: Tracy Bonilla MRN: [id number] Date of Birth: 01/21/1951

## 2020-03-01 ENCOUNTER — Ambulatory Visit (HOSPITAL_COMMUNITY): Payer: Medicare Other | Admitting: Physical Therapy

## 2020-03-01 ENCOUNTER — Other Ambulatory Visit: Payer: Self-pay

## 2020-03-01 DIAGNOSIS — M25572 Pain in left ankle and joints of left foot: Secondary | ICD-10-CM | POA: Diagnosis not present

## 2020-03-01 DIAGNOSIS — M25571 Pain in right ankle and joints of right foot: Secondary | ICD-10-CM | POA: Diagnosis not present

## 2020-03-01 DIAGNOSIS — R262 Difficulty in walking, not elsewhere classified: Secondary | ICD-10-CM | POA: Diagnosis not present

## 2020-03-01 NOTE — Therapy (Signed)
[facility] [address] [place], [place], [address] Phone: [phone]   Fax:  [phone]  Physical Therapy Treatment  Patient Details  Name: Tracy Bonilla MRN: [id number] Date of Birth: [birth year] Referring Provider (PT): Alfredo Martinez PA   Encounter Date: 03/01/2020   PT End of Session - 03/01/20 1451    Visit Number 10    Number of Visits 16    Date for PT Re-Evaluation 03/10/20    Authorization Type medicare primary, [place] secondary, no auth    Progress Note Due on Visit 16    PT Start Time 1405    PT Stop Time 1445    PT Time Calculation (min) 40 min    Equipment Utilized During Treatment Other (comment)   brace and SPC (brace removed during session)   Activity Tolerance Patient tolerated treatment well    Behavior During Therapy [facility] for tasks assessed/performed           Past Medical History:  Diagnosis Date  . Arthritis   . Complication of anesthesia   . Diabetes mellitus without complication (HCC)   . Hyperlipidemia   . Hypertension   . PONV (postoperative nausea and vomiting)     Past Surgical History:  Procedure Laterality Date  . arthroscopic surgery Left   . BREAST SURGERY Left    1998  . cataract surgery    . DILATION AND CURETTAGE OF UTERUS    . extra digit removed     thumb  . FOOT SURGERY Right   . KNEE ARTHROPLASTY Right 05/28/2017   Procedure: COMPUTER ASSISTED TOTAL KNEE ARTHROPLASTY;  Surgeon: Samson Frederic, MD;  Location: [facility] OR;  Service: Orthopedics;  Laterality: Right;  . REFRACTIVE SURGERY     both eyes  . TOTAL KNEE ARTHROPLASTY Left 05/14/2015   Procedure: LEFT TOTAL KNEE ARTHROPLASTY;  Surgeon: Samson Frederic, MD;  Location: [facility];  Service: Orthopedics;  Laterality: Left;  . TUBAL LIGATION      There were no vitals filed for this visit.   Subjective Assessment - 03/01/20 1420    Subjective pt states it is getting better.  Goes Monday (7/19) to get her orthotics molds at [facility].     Currently in Pain? No/denies                             [facility] Adult PT Treatment/Exercise - 03/01/20 0001      Ankle Exercises: Standing   Vector Stance Right;Left;5 seconds    Vector Stance Limitations 10 reps 1 HHA    Heel Raises 15 reps    Toe Raise 15 reps    Other Standing Ankle Exercises tandem stance 3x 30"    Other Standing Ankle Exercises hip abduction and extension 10 reps each      Ankle Exercises: Stretches   Slant Board Stretch 3 reps;30 seconds      Ankle Exercises: Seated   BAPS Sitting;Level 3;10 reps    Other Seated Ankle Exercises toe extension 10x 10"    Other Seated Ankle Exercises foot windshield wipers seated - 2x15                     PT Short Term Goals - 02/19/20 1409      PT SHORT TERM GOAL #1   Title Patient will be independent in self management strategies to improve quality of life and functional outcomes.    Time 3  Period Weeks    Status New    Target Date 02/18/20      PT SHORT TERM GOAL #2   Title Patient will report at least 25% improvement in overall symptoms and/or functional ability.    Time 3    Period Weeks    Status On-going    Target Date 02/18/20             PT Long Term Goals - 02/19/20 1409      PT LONG TERM GOAL #1   Title Patient will report at least 50% improvement in overall symptoms and/or functional ability.    Time 6    Period Weeks    Status On-going      PT LONG TERM GOAL #2   Title Patient will be able to stand on left leg with 1 UE support for at least 10 seconds to demonstrate improved static balance.    Time 6    Period Weeks    Status On-going      PT LONG TERM GOAL #3   Title Patient will score with at least 60% function on FOTO to demonstrate improved overall function.    Time 6    Period Weeks    Status On-going                 Plan - 03/01/20 1455    Clinical Impression Statement continued with established therex working on improving ankle strength  and stability.  Added hip strengthening and slant board stretch this session with good form.  No return or reproduction of pain this session.  No pain at end of session.    Personal Factors and Comorbidities Comorbidity 1;Comorbidity 2;Age    Comorbidities DB, L TKA 2016, R TKA 2018    Examination-Activity Limitations Stand;Stairs;Locomotion Level    Examination-Participation Restrictions Church;Cleaning;Community Activity;Laundry;Meal Prep    Stability/Clinical Decision Making Stable/Uncomplicated    Rehab Potential Good    PT Frequency 2x / week    PT Duration 8 weeks    PT Treatment/Interventions ADLs/Self Care Home Management;Aquatic Therapy;Electrical Stimulation;Iontophoresis 4mg /ml Dexamethasone;Moist Heat;Traction;Balance training;Therapeutic exercise;Therapeutic activities;Functional mobility training;Stair training;Gait training;DME Instruction;Neuromuscular re-education;Patient/family education;Manual techniques;Dry needling;Passive range of motion;Joint Manipulations;Cryotherapy;Taping    PT Next Visit Plan gastroc stretch, ankle strengtheing, hip strengthening, balance, arch strengthening.    PT Home Exercise Plan 6/15:  towel curl, marble pick up, heel/toe raises seated and standing with support. 02/11/20: ankle band 4 way; 7/1: isometric, standing heel and toe raises as well as SLS           Patient will benefit from skilled therapeutic intervention in order to improve the following deficits and impairments:  Pain, Decreased balance, Decreased mobility, Difficulty walking, Hypermobility, Decreased range of motion, Abnormal gait, Decreased endurance, Increased edema, Decreased activity tolerance, Decreased knowledge of use of DME, Decreased strength  Visit Diagnosis: Pain in right ankle and joints of right foot  Pain in left ankle and joints of left foot  Difficulty in walking, not elsewhere classified     Problem List Patient Active Problem List   Diagnosis Date Noted  .  Umbilical hernia 02/27/2018  . Osteoarthritis of right knee 05/28/2017  . Unspecified essential hypertension 03/03/2014    Class: History of  . Type 2 diabetes mellitus (HCC) 03/03/2014    Class: History of  . CONTRACTURE OF SHOULDER JOINT 12/03/2007  . SHOULDER PAIN 12/03/2007  . IMPINGEMENT SYNDROME 12/03/2007   12/05/2007, PTA/CLT [id number]  [phone], Meshell Abdulaziz B 03/01/2020, 3:02 PM  [facility] [address] [place], [place], [address] Phone: [phone]   Fax:  [phone]  Name: ANAISA RADI MRN: [id number] Date of Birth: 31-Aug-1950

## 2020-03-03 ENCOUNTER — Ambulatory Visit (HOSPITAL_COMMUNITY): Payer: Medicare Other

## 2020-03-03 ENCOUNTER — Encounter (HOSPITAL_COMMUNITY): Payer: Self-pay

## 2020-03-03 ENCOUNTER — Other Ambulatory Visit: Payer: Self-pay

## 2020-03-03 DIAGNOSIS — R262 Difficulty in walking, not elsewhere classified: Secondary | ICD-10-CM

## 2020-03-03 DIAGNOSIS — M25571 Pain in right ankle and joints of right foot: Secondary | ICD-10-CM

## 2020-03-03 DIAGNOSIS — M25572 Pain in left ankle and joints of left foot: Secondary | ICD-10-CM | POA: Diagnosis not present

## 2020-03-03 NOTE — Therapy (Signed)
[facility] [address] [place], [place], [address] Phone: [phone]   Fax:  [phone]  Physical Therapy Treatment  Patient Details  Name: Tracy Bonilla MRN: [id number] Date of Birth: 10-08-1950 Referring Provider (PT): Alfredo Martinez PA   Encounter Date: 03/03/2020   PT End of Session - 03/03/20 1453    Visit Number 11    Number of Visits 16    Date for PT Re-Evaluation 03/10/20    Authorization Type medicare primary, [place] secondary, no auth    Progress Note Due on Visit 16    PT Start Time 1447    PT Stop Time 1528    PT Time Calculation (min) 41 min    Equipment Utilized During Treatment --   SPC, no brace wore during session.   Activity Tolerance Patient tolerated treatment well    Behavior During Therapy [facility] for tasks assessed/performed           Past Medical History:  Diagnosis Date  . Arthritis   . Complication of anesthesia   . Diabetes mellitus without complication (HCC)   . Hyperlipidemia   . Hypertension   . PONV (postoperative nausea and vomiting)     Past Surgical History:  Procedure Laterality Date  . arthroscopic surgery Left   . BREAST SURGERY Left    1998  . cataract surgery    . DILATION AND CURETTAGE OF UTERUS    . extra digit removed     thumb  . FOOT SURGERY Right   . KNEE ARTHROPLASTY Right 05/28/2017   Procedure: COMPUTER ASSISTED TOTAL KNEE ARTHROPLASTY;  Surgeon: Samson Frederic, MD;  Location: [facility] OR;  Service: Orthopedics;  Laterality: Right;  . REFRACTIVE SURGERY     both eyes  . TOTAL KNEE ARTHROPLASTY Left 05/14/2015   Procedure: LEFT TOTAL KNEE ARTHROPLASTY;  Surgeon: Samson Frederic, MD;  Location: [facility];  Service: Orthopedics;  Laterality: Left;  . TUBAL LIGATION      There were no vitals filed for this visit.   Subjective Assessment - 03/03/20 1450    Subjective Pt stated she is doing a lot better.  Reports ability to spend all morning shopping in [place].  Reports she forgot  brace and no problems without.  Goes Monday (7/19) for orthotics molds at [facility].  Reports family members have noticed improved gait mechanics and ability to stand for longer periods of time.    Pertinent History DB, L TKA 2016, R TKA 2018    Patient Stated Goals No surgery, stop wearing brace, reduce pain and improve gait.    Currently in Pain? No/denies              [facility] PT Assessment - 03/03/20 0001      Assessment   Medical Diagnosis dysfunction of post tibial tendon L    Referring Provider (PT) Alfredo Martinez PA    Next MD Visit 03/08/20 for orthothic arch support; 2 months MD Veleta Miners PRN    Prior Therapy yes for her knees                         [facility] Adult PT Treatment/Exercise - 03/03/20 0001      Exercises   Exercises Ankle      Ankle Exercises: Standing   Vector Stance Right;Left;5 seconds    Vector Stance Limitations 5 reps 1 HHA BLE    Heel Raises 15 reps    Heel Raises Limitations incline slope  Toe Raise 15 reps    Toe Raise Limitations incline slope    Other Standing Ankle Exercises tandem stance 2x 30", tandem on foam 2x 30" wiht 1 HHA    Other Standing Ankle Exercises squats front of chair (cueing for mechanics); hip abd and extension 10x each      Ankle Exercises: Seated   BAPS Sitting;Level 3;10 reps   DF/PF; INV/EV; CW/CCW   Other Seated Ankle Exercises STS 10x                     PT Short Term Goals - 02/19/20 1409      PT SHORT TERM GOAL #1   Title Patient will be independent in self management strategies to improve quality of life and functional outcomes.    Time 3    Period Weeks    Status New    Target Date 02/18/20      PT SHORT TERM GOAL #2   Title Patient will report at least 25% improvement in overall symptoms and/or functional ability.    Time 3    Period Weeks    Status On-going    Target Date 02/18/20             PT Long Term Goals - 02/19/20 1409      PT LONG TERM GOAL #1   Title Patient will  report at least 50% improvement in overall symptoms and/or functional ability.    Time 6    Period Weeks    Status On-going      PT LONG TERM GOAL #2   Title Patient will be able to stand on left leg with 1 UE support for at least 10 seconds to demonstrate improved static balance.    Time 6    Period Weeks    Status On-going      PT LONG TERM GOAL #3   Title Patient will score with at least 60% function on FOTO to demonstrate improved overall function.    Time 6    Period Weeks    Status On-going                 Plan - 03/03/20 1520    Clinical Impression Statement Pt progressing well toward goals.  Continued therex focus on improving ankle strengthening and stability.  Added squats for hip strengthening to assist wiht balance wiht cueing for mechanics in front of chair.  Progressed to dynamic surface with tandem stance, did require some HHA and cueing for posture.  No reports of pain through session.    Personal Factors and Comorbidities Comorbidity 1;Comorbidity 2;Age    Comorbidities DB, L TKA 2016, R TKA 2018    Examination-Activity Limitations Stand;Stairs;Locomotion Level    Examination-Participation Restrictions Church;Cleaning;Community Activity;Laundry;Meal Prep    Stability/Clinical Decision Making Stable/Uncomplicated    Clinical Decision Making Low    PT Frequency 2x / week    PT Duration 8 weeks    PT Treatment/Interventions ADLs/Self Care Home Management;Aquatic Therapy;Electrical Stimulation;Iontophoresis 4mg /ml Dexamethasone;Moist Heat;Traction;Balance training;Therapeutic exercise;Therapeutic activities;Functional mobility training;Stair training;Gait training;DME Instruction;Neuromuscular re-education;Patient/family education;Manual techniques;Dry needling;Passive range of motion;Joint Manipulations;Cryotherapy;Taping    PT Next Visit Plan Begin step up training next session.  gastroc stretch, ankle strengtheing, hip strengthening, balance, arch strengthening.      PT Home Exercise Plan 6/15:  towel curl, marble pick up, heel/toe raises seated and standing with support. 02/11/20: ankle band 4 way; 7/1: isometric, standing heel and toe raises as well as SLS  Patient will benefit from skilled therapeutic intervention in order to improve the following deficits and impairments:  Pain, Decreased balance, Decreased mobility, Difficulty walking, Hypermobility, Decreased range of motion, Abnormal gait, Decreased endurance, Increased edema, Decreased activity tolerance, Decreased knowledge of use of DME, Decreased strength  Visit Diagnosis: Pain in right ankle and joints of right foot  Pain in left ankle and joints of left foot  Difficulty in walking, not elsewhere classified     Problem List Patient Active Problem List   Diagnosis Date Noted  . Umbilical hernia 02/27/2018  . Osteoarthritis of right knee 05/28/2017  . Unspecified essential hypertension 03/03/2014    Class: History of  . Type 2 diabetes mellitus (HCC) 03/03/2014    Class: History of  . CONTRACTURE OF SHOULDER JOINT 12/03/2007  . SHOULDER PAIN 12/03/2007  . IMPINGEMENT SYNDROME 12/03/2007   Becky Sax, LPTA/CLT; CBIS [phone]  Juel Burrow 03/03/2020, 3:27 PM  [facility] [address] [place], [place], [address] Phone: [phone]   Fax:  [phone]  Name: Tracy Bonilla MRN: [id number] Date of Birth: 05/28/1951

## 2020-03-08 ENCOUNTER — Ambulatory Visit (HOSPITAL_COMMUNITY): Payer: Medicare Other

## 2020-03-08 ENCOUNTER — Other Ambulatory Visit: Payer: Self-pay

## 2020-03-08 DIAGNOSIS — M25571 Pain in right ankle and joints of right foot: Secondary | ICD-10-CM | POA: Diagnosis not present

## 2020-03-08 DIAGNOSIS — R262 Difficulty in walking, not elsewhere classified: Secondary | ICD-10-CM

## 2020-03-08 DIAGNOSIS — M25572 Pain in left ankle and joints of left foot: Secondary | ICD-10-CM

## 2020-03-08 NOTE — Therapy (Signed)
[facility] [address] [place], [place], [address] Phone: [phone]   Fax:  [phone]  Physical Therapy Treatment  Patient Details  Name: TAZARIA DLUGOSZ MRN: [id number] Date of Birth: Jan 21, 1951 Referring Provider (PT): Alfredo Martinez PA   Encounter Date: 03/08/2020   PT End of Session - 03/08/20 1449    Visit Number 12    Number of Visits 16    Date for PT Re-Evaluation 03/10/20    Authorization Type medicare primary, [place] secondary, no auth    Progress Note Due on Visit 16    PT Start Time 1404    PT Stop Time 1442    PT Time Calculation (min) 38 min    Equipment Utilized During Treatment --   SPC, no brace wore during session.   Activity Tolerance Patient tolerated treatment well    Behavior During Therapy [facility] for tasks assessed/performed           Past Medical History:  Diagnosis Date  . Arthritis   . Complication of anesthesia   . Diabetes mellitus without complication (HCC)   . Hyperlipidemia   . Hypertension   . PONV (postoperative nausea and vomiting)     Past Surgical History:  Procedure Laterality Date  . arthroscopic surgery Left   . BREAST SURGERY Left    1998  . cataract surgery    . DILATION AND CURETTAGE OF UTERUS    . extra digit removed     thumb  . FOOT SURGERY Right   . KNEE ARTHROPLASTY Right 05/28/2017   Procedure: COMPUTER ASSISTED TOTAL KNEE ARTHROPLASTY;  Surgeon: Samson Frederic, MD;  Location: [facility];  Service: Orthopedics;  Laterality: Right;  . REFRACTIVE SURGERY     both eyes  . TOTAL KNEE ARTHROPLASTY Left 05/14/2015   Procedure: LEFT TOTAL KNEE ARTHROPLASTY;  Surgeon: Samson Frederic, MD;  Location: [facility];  Service: Orthopedics;  Laterality: Left;  . TUBAL LIGATION      There were no vitals filed for this visit.   Subjective Assessment - 03/08/20 1448    Subjective Patient reoprts she is doing so much better. She had a very active Friday and Saturday without carry over painful  effects. In the past, it would take 3-4 days for her pain to go away after doing too much. Fitted for custom orthotics at [facility] this am.    Pertinent History DB, L TKA 2016, R TKA 2018    Patient Stated Goals No surgery, stop wearing brace, reduce pain and improve gait.    Currently in Pain? No/denies                             [facility] Adult PT Treatment/Exercise - 03/08/20 0001      Exercises   Exercises Knee/Hip      Knee/Hip Exercises: Standing   Forward Step Up Both;1 set;10 reps;Hand Hold: 1;Step Height: 4"    Step Down Both;1 set;10 reps;Hand Hold: 1;Step Height: 4"    Stairs 4" and 7" riser 2 RT each      Ankle Exercises: Stretches   Slant Board Stretch 3 reps;30 seconds      Ankle Exercises: Standing   Vector Stance Right;Left;5 seconds    Vector Stance Limitations 5 reps 1 HHA BLE    Heel Raises 15 reps    Heel Raises Limitations incline slope    Toe Raise 15 reps    Toe Raise Limitations incline  slope    Other Standing Ankle Exercises tandem stance 2x 30", tandem on foam 2x 30" wiht 1 HHA    Other Standing Ankle Exercises squats front of chair (cueing for mechanics); hip abd and extension 10x each                  PT Education - 03/08/20 1449    Education Details Discussed technique and purpose throughout session.    Person(s) Educated Patient    Methods Explanation;Demonstration    Comprehension Verbalized understanding;Returned demonstration            PT Short Term Goals - 03/08/20 1408      PT SHORT TERM GOAL #1   Title Patient will be independent in self management strategies to improve quality of life and functional outcomes.    Time 3    Period Weeks    Status On-going    Target Date 02/18/20      PT SHORT TERM GOAL #2   Title Patient will report at least 25% improvement in overall symptoms and/or functional ability.    Baseline 03/08/20 - patient reports near 100% imrovement in symptoms and functional ability.     Time 3    Period Weeks    Status On-going    Target Date 02/18/20             PT Long Term Goals - 03/08/20 1410      PT LONG TERM GOAL #1   Title Patient will report at least 50% improvement in overall symptoms and/or functional ability.    Baseline 03/08/20 - patient reports near 100% imrovement in symptoms and functional ability.    Time 6    Period Weeks    Status Achieved      PT LONG TERM GOAL #2   Title Patient will be able to stand on left leg with 1 UE support for at least 10 seconds to demonstrate improved static balance.    Baseline 03/08/20 - on left with minimal uppper extremity assist - >15 seconds    Time 6    Period Weeks    Status Achieved      PT LONG TERM GOAL #3   Title Patient will score with at least 60% function on FOTO to demonstrate improved overall function.    Time 6    Period Weeks    Status On-going                 Plan - 03/08/20 1445    Clinical Impression Statement Pt progressing well toward goals. Continued therex focus on improving ankle strengthening, stability and functional abilities. Continue squats for hip strengthening to assist with balance with cueing for mechanics in front of chair. Continued dynamic surface with tandem stance; continues to require some HHA and cueing for posture. Added 4" and 7" riser stair practice with 1 HHA and cuing to use reciprocal pattern on descent. No reports of pain through session. Re-eval scheduled next session. Potential DC to HEP.    Personal Factors and Comorbidities Comorbidity 1;Comorbidity 2;Age    Comorbidities DB, L TKA 2016, R TKA 2018    Examination-Activity Limitations Stand;Stairs;Locomotion Level    Examination-Participation Restrictions Church;Cleaning;Community Activity;Laundry;Meal Prep    Stability/Clinical Decision Making Stable/Uncomplicated    PT Frequency 2x / week    PT Duration 8 weeks    PT Treatment/Interventions ADLs/Self Care Home Management;Aquatic Therapy;Electrical  Stimulation;Iontophoresis 4mg /ml Dexamethasone;Moist Heat;Traction;Balance training;Therapeutic exercise;Therapeutic activities;Functional mobility training;Stair training;Gait training;DME Instruction;Neuromuscular re-education;Patient/family education;Manual techniques;Dry needling;Passive  range of motion;Joint Manipulations;Cryotherapy;Taping    PT Next Visit Plan re-eval next session; potential DC to HEP.    PT Home Exercise Plan 6/15:  towel curl, marble pick up, heel/toe raises seated and standing with support. 02/11/20: ankle band 4 way; 7/1: isometric, standing heel and toe raises as well as SLS           Patient will benefit from skilled therapeutic intervention in order to improve the following deficits and impairments:  Pain, Decreased balance, Decreased mobility, Difficulty walking, Hypermobility, Decreased range of motion, Abnormal gait, Decreased endurance, Increased edema, Decreased activity tolerance, Decreased knowledge of use of DME, Decreased strength  Visit Diagnosis: Pain in right ankle and joints of right foot  Pain in left ankle and joints of left foot  Difficulty in walking, not elsewhere classified     Problem List Patient Active Problem List   Diagnosis Date Noted  . Umbilical hernia 02/27/2018  . Osteoarthritis of right knee 05/28/2017  . Unspecified essential hypertension 03/03/2014    Class: History of  . Type 2 diabetes mellitus (HCC) 03/03/2014    Class: History of  . CONTRACTURE OF SHOULDER JOINT 12/03/2007  . SHOULDER PAIN 12/03/2007  . IMPINGEMENT SYNDROME 12/03/2007    Katina Dung. Hartnett-Rands, MS, PT Per Ladoris Gene [facility] #[id number] 03/08/2020, 2:51 PM  [facility] [address] [place], [place], [address] Phone: [phone]   Fax:  [phone]  Name: CHAVON LUCARELLI MRN: [id number] Date of Birth: 1951/05/21

## 2020-03-12 ENCOUNTER — Other Ambulatory Visit: Payer: Self-pay

## 2020-03-12 ENCOUNTER — Encounter (HOSPITAL_COMMUNITY): Payer: Self-pay | Admitting: Physical Therapy

## 2020-03-12 ENCOUNTER — Ambulatory Visit (HOSPITAL_COMMUNITY): Payer: Medicare Other | Admitting: Physical Therapy

## 2020-03-12 DIAGNOSIS — M25571 Pain in right ankle and joints of right foot: Secondary | ICD-10-CM | POA: Diagnosis not present

## 2020-03-12 DIAGNOSIS — M25572 Pain in left ankle and joints of left foot: Secondary | ICD-10-CM

## 2020-03-12 DIAGNOSIS — R262 Difficulty in walking, not elsewhere classified: Secondary | ICD-10-CM

## 2020-03-12 NOTE — Therapy (Signed)
[place] [address] [place], [place], [address] Phone: [phone]   Fax:  [phone]   Physical Therapy Treatment and Discharge Note  Patient Details  Name: Tracy Bonilla MRN: [id number] Date of Birth: 03-18-1951 Referring Provider (PT): Mechele Claude PA  PHYSICAL THERAPY DISCHARGE SUMMARY  Visits from Start of Care: 13  Current functional level related to goals / functional outcomes:  all goals met at this time   Remaining deficits: See below  Education / Equipment: See below  Plan: Patient agrees to discharge.  Patient goals were met. Patient is being discharged due to meeting the stated rehab goals.  ?????        Encounter Date: 03/12/2020   PT End of Session - 03/12/20 1300    Visit Number 13    Number of Visits 16    Date for PT Re-Evaluation 03/10/20    Authorization Type medicare primary, [place] secondary, no auth    Progress Note Due on Visit 16    PT Start Time 1300    PT Stop Time 1340    PT Time Calculation (min) 40 min    Equipment Utilized During Treatment --   SPC, no brace wore during session.   Activity Tolerance Patient tolerated treatment well    Behavior During Therapy [facility] for tasks assessed/performed           Past Medical History:  Diagnosis Date  . Arthritis   . Complication of anesthesia   . Diabetes mellitus without complication ([place])   . Hyperlipidemia   . Hypertension   . PONV (postoperative nausea and vomiting)     Past Surgical History:  Procedure Laterality Date  . arthroscopic surgery Left   . BREAST SURGERY Left    1998  . cataract surgery    . DILATION AND CURETTAGE OF UTERUS    . extra digit removed     thumb  . FOOT SURGERY Right   . KNEE ARTHROPLASTY Right 05/28/2017   Procedure: COMPUTER ASSISTED TOTAL KNEE ARTHROPLASTY;  Surgeon: Rod Can, MD;  Location: [place];  Service: Orthopedics;  Laterality: Right;  . REFRACTIVE SURGERY     both eyes  . TOTAL KNEE  ARTHROPLASTY Left 05/14/2015   Procedure: LEFT TOTAL KNEE ARTHROPLASTY;  Surgeon: Rod Can, MD;  Location: [facility];  Service: Orthopedics;  Laterality: Left;  . TUBAL LIGATION      There were no vitals filed for this visit.   Subjective Assessment - 03/12/20 1319    Subjective Patient reports she feels 100% better since the start of PT.  States she is doing her exercises without difficulty and feels she can do it at home.    Pertinent History DB, L TKA 2016, R TKA 2018    Patient Stated Goals No surgery, stop wearing brace, reduce pain and improve gait.    Currently in Pain? No/denies              [facility] PT Assessment - 03/12/20 0001      Assessment   Medical Diagnosis dysfunction of post tibial tendon L    Referring Provider (PT) Mechele Claude PA      Observation/Other Assessments   Focus on Therapeutic Outcomes (FOTO)  75% function    was 56%      Observation/Other Assessments-Edema    Edema Figure 8      Figure 8 Edema   Figure 8 - Right  48.5cm    Figure 8 - Left  49.0 cm      Strength   Right Ankle Dorsiflexion 5/5    Right Ankle Plantar Flexion 2+/5    Right Ankle Inversion 4-/5    Right Ankle Eversion 5/5    Left Ankle Dorsiflexion 5/5    Left Ankle Plantar Flexion 2+/5    Left Ankle Inversion 3+/5    Left Ankle Eversion 4+/5                              Balance Exercises - 03/12/20 0001      Balance Exercises: Standing   Standing Eyes Opened Narrow base of support (BOS);Head turns;30 secs;4 reps;Foam/compliant surface    Tandem Stance Eyes open;Foam/compliant surface;Upper extremity support 1;3 reps;30 secs   B on towel            PT Education - 03/12/20 1334    Education Details in FOTO score, progress, goals and reviewed HEP    Person(s) Educated Patient    Methods Explanation    Comprehension Verbalized understanding            PT Short Term Goals - 03/12/20 1310      PT SHORT TERM GOAL #1   Title Patient will be  independent in self management strategies to improve quality of life and functional outcomes.    Time 3    Period Weeks    Status Achieved    Target Date 02/18/20      PT SHORT TERM GOAL #2   Title Patient will report at least 25% improvement in overall symptoms and/or functional ability.    Baseline 03/08/20 - patient reports near 100% imrovement in symptoms and functional ability.    Time 3    Period Weeks    Status Achieved    Target Date 02/18/20             PT Long Term Goals - 03/12/20 1311      PT LONG TERM GOAL #1   Title Patient will report at least 50% improvement in overall symptoms and/or functional ability.    Baseline 03/08/20 - patient reports near 100% imrovement in symptoms and functional ability.    Time 6    Period Weeks    Status Achieved      PT LONG TERM GOAL #2   Title Patient will be able to stand on left leg with 1 UE support for at least 10 seconds to demonstrate improved static balance.    Baseline 03/08/20 - on left with minimal uppper extremity assist - >15 seconds    Time 6    Period Weeks    Status Achieved      PT LONG TERM GOAL #3   Title Patient will score with at least 60% function on FOTO to demonstrate improved overall function.    Time 6    Period Weeks    Status Achieved                 Plan - 03/12/20 1321    Clinical Impression Statement Patient has met all short and long term goals at this time. Reviewed FOTO score, HEP and progress with patient. Answered all questions prior to end of session. Patient confident in home exercises and is happy with progress since the start of PT. Patient to discharge from PT to HEP at this time.    Personal Factors and Comorbidities Comorbidity 1;Comorbidity 2;Age    Comorbidities DB, L TKA 2016,  R TKA 2018    Examination-Activity Limitations Stand;Stairs;Locomotion Level    Examination-Participation Restrictions Church;Cleaning;Community Activity;Laundry;Meal Prep    Stability/Clinical  Decision Making Stable/Uncomplicated    PT Frequency 2x / week    PT Duration 8 weeks    PT Treatment/Interventions ADLs/Self Care Home Management;Aquatic Therapy;Electrical Stimulation;Iontophoresis 77m/ml Dexamethasone;Moist Heat;Traction;Balance training;Therapeutic exercise;Therapeutic activities;Functional mobility training;Stair training;Gait training;DME Instruction;Neuromuscular re-education;Patient/family education;Manual techniques;Dry needling;Passive range of motion;Joint Manipulations;Cryotherapy;Taping    PT Next Visit Plan Pt to DC from PT to HEP at this time    PT Home Exercise Plan 6/15:  towel curl, marble pick up, heel/toe raises seated and standing with support. 02/11/20: ankle band 4 way; 7/1: isometric, standing heel and toe raises as well as SLS           Patient will benefit from skilled therapeutic intervention in order to improve the following deficits and impairments:  Pain, Decreased balance, Decreased mobility, Difficulty walking, Hypermobility, Decreased range of motion, Abnormal gait, Decreased endurance, Increased edema, Decreased activity tolerance, Decreased knowledge of use of DME, Decreased strength  Visit Diagnosis: Pain in right ankle and joints of right foot  Pain in left ankle and joints of left foot  Difficulty in walking, not elsewhere classified     Problem List Patient Active Problem List   Diagnosis Date Noted  . Umbilical hernia 055/97/4163 . Osteoarthritis of right knee 05/28/2017  . Unspecified essential hypertension 03/03/2014    Class: History of  . [place] 03/03[place]    Class: History of  . CONTRACTURE OF SHOULDER JOINT 12/03/2007  . SHOULDER PAIN 12/03/2007  . IMPINGEMENT SYNDROME 12/03/2007   1:38 PM, 03/12/20 MJerene Pitch DPT [facility] [phone]  [place]  [address]Phone: [phone]  Fax:  [phone] Name: Tracy BURGENMRN: [id number] of Birth: [birth year]

## 2020-03-19 DIAGNOSIS — M1712 Unilateral primary osteoarthritis, left knee: Secondary | ICD-10-CM | POA: Diagnosis not present

## 2020-03-19 DIAGNOSIS — E7849 Other hyperlipidemia: Secondary | ICD-10-CM | POA: Diagnosis not present

## 2020-03-19 DIAGNOSIS — E1165 Type 2 diabetes mellitus with hyperglycemia: Secondary | ICD-10-CM | POA: Diagnosis not present

## 2020-03-19 DIAGNOSIS — I10 Essential (primary) hypertension: Secondary | ICD-10-CM | POA: Diagnosis not present

## 2020-04-20 DIAGNOSIS — E7849 Other hyperlipidemia: Secondary | ICD-10-CM | POA: Diagnosis not present

## 2020-04-20 DIAGNOSIS — I10 Essential (primary) hypertension: Secondary | ICD-10-CM | POA: Diagnosis not present

## 2020-04-20 DIAGNOSIS — M1712 Unilateral primary osteoarthritis, left knee: Secondary | ICD-10-CM | POA: Diagnosis not present

## 2020-04-20 DIAGNOSIS — E1165 Type 2 diabetes mellitus with hyperglycemia: Secondary | ICD-10-CM | POA: Diagnosis not present

## 2020-05-20 DIAGNOSIS — M1712 Unilateral primary osteoarthritis, left knee: Secondary | ICD-10-CM | POA: Diagnosis not present

## 2020-05-20 DIAGNOSIS — E1165 Type 2 diabetes mellitus with hyperglycemia: Secondary | ICD-10-CM | POA: Diagnosis not present

## 2020-05-20 DIAGNOSIS — I10 Essential (primary) hypertension: Secondary | ICD-10-CM | POA: Diagnosis not present

## 2020-05-20 DIAGNOSIS — E7849 Other hyperlipidemia: Secondary | ICD-10-CM | POA: Diagnosis not present

## 2020-05-26 DIAGNOSIS — M25572 Pain in left ankle and joints of left foot: Secondary | ICD-10-CM | POA: Diagnosis not present

## 2020-05-26 DIAGNOSIS — M67969 Unspecified disorder of synovium and tendon, unspecified lower leg: Secondary | ICD-10-CM | POA: Diagnosis not present

## 2020-05-26 DIAGNOSIS — M67962 Unspecified disorder of synovium and tendon, left lower leg: Secondary | ICD-10-CM | POA: Diagnosis not present

## 2020-06-03 DIAGNOSIS — M25572 Pain in left ankle and joints of left foot: Secondary | ICD-10-CM | POA: Diagnosis not present

## 2020-06-03 DIAGNOSIS — Z681 Body mass index (BMI) 19 or less, adult: Secondary | ICD-10-CM | POA: Diagnosis not present

## 2020-06-03 DIAGNOSIS — Z23 Encounter for immunization: Secondary | ICD-10-CM | POA: Diagnosis not present

## 2020-06-03 DIAGNOSIS — E119 Type 2 diabetes mellitus without complications: Secondary | ICD-10-CM | POA: Diagnosis not present

## 2020-06-12 DIAGNOSIS — M25572 Pain in left ankle and joints of left foot: Secondary | ICD-10-CM | POA: Diagnosis not present

## 2020-06-19 DIAGNOSIS — E7849 Other hyperlipidemia: Secondary | ICD-10-CM | POA: Diagnosis not present

## 2020-06-19 DIAGNOSIS — I10 Essential (primary) hypertension: Secondary | ICD-10-CM | POA: Diagnosis not present

## 2020-06-19 DIAGNOSIS — E1165 Type 2 diabetes mellitus with hyperglycemia: Secondary | ICD-10-CM | POA: Diagnosis not present

## 2020-06-19 DIAGNOSIS — M1712 Unilateral primary osteoarthritis, left knee: Secondary | ICD-10-CM | POA: Diagnosis not present

## 2020-06-21 DIAGNOSIS — M76822 Posterior tibial tendinitis, left leg: Secondary | ICD-10-CM | POA: Diagnosis not present

## 2020-06-29 IMAGING — MG DIGITAL SCREENING BILATERAL MAMMOGRAM WITH TOMO AND CAD
8 series · 8 of 24 positions shown · non-contrast
Comparison: Previous exam(s).

CLINICAL DATA: Screening.

EXAM:
DIGITAL SCREENING BILATERAL MAMMOGRAM WITH TOMO AND CAD

[R MLO synth-2D]
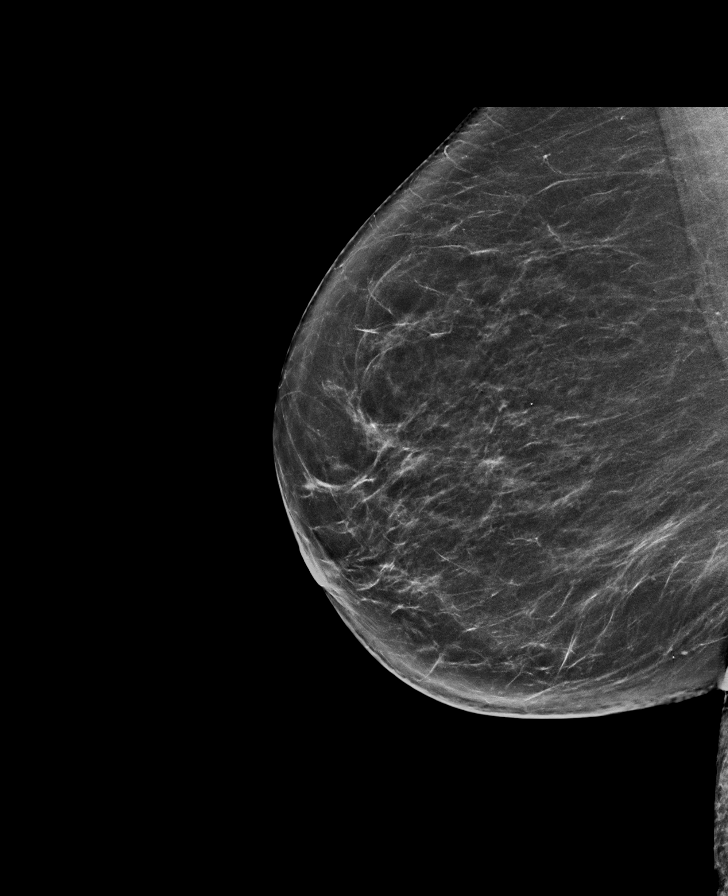

[L CC synth-2D]
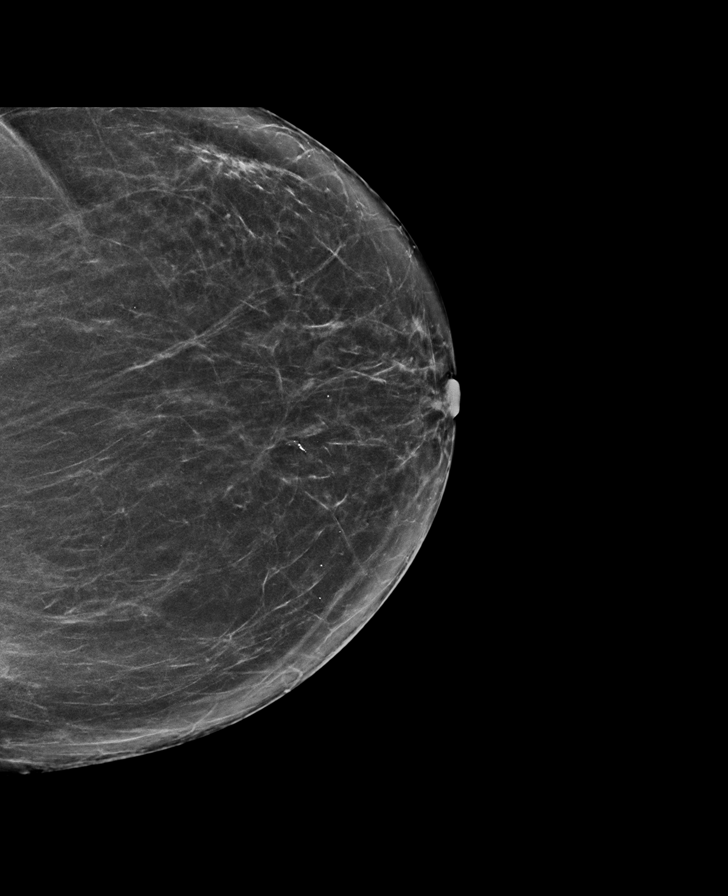

[R CC synth-2D]
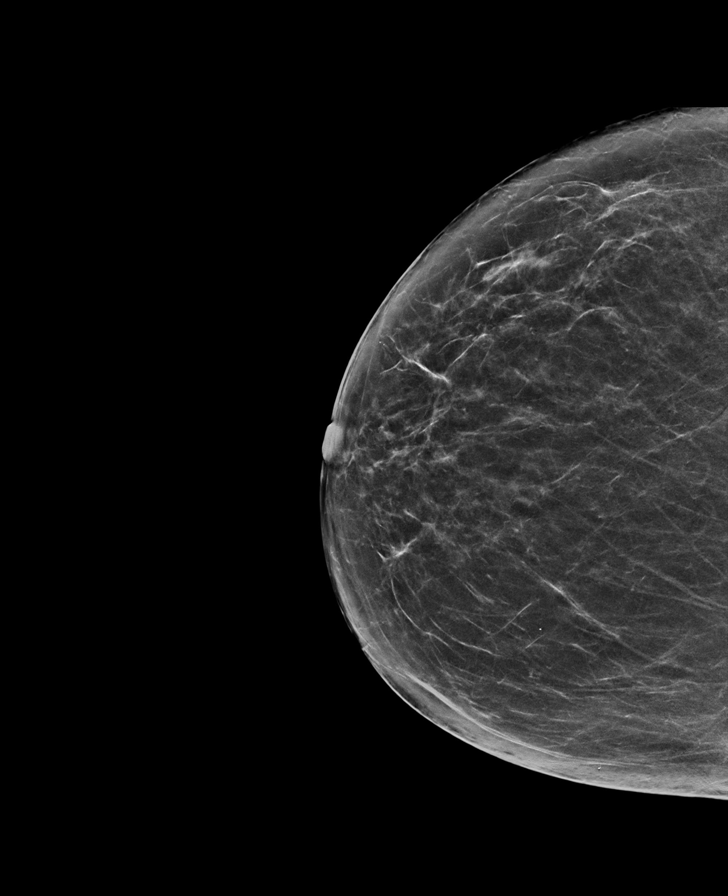

[L MLO synth-2D]
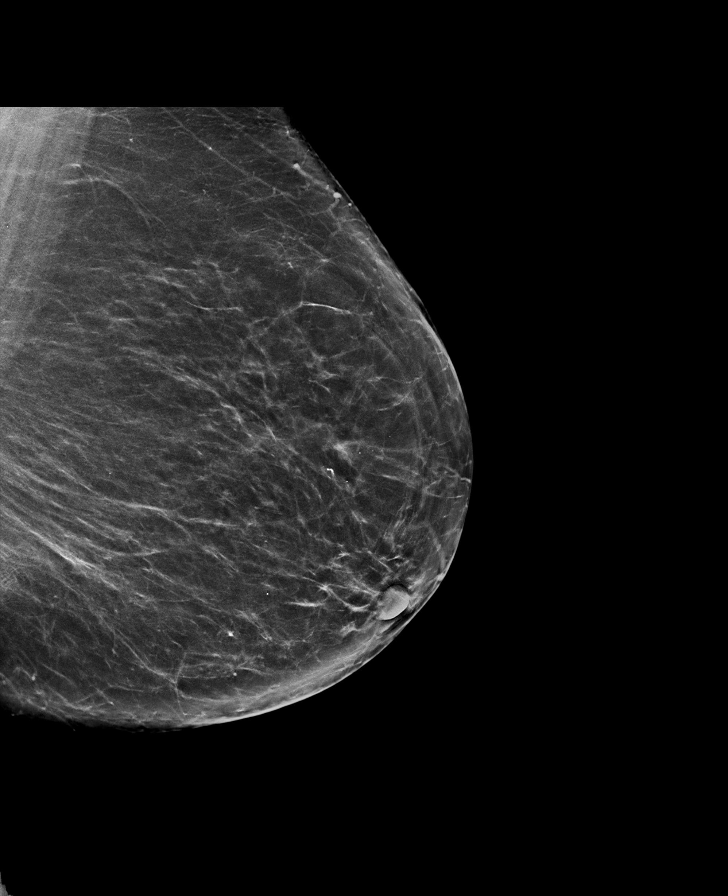

[L MLO tomo · tomo slice 41/81.0]
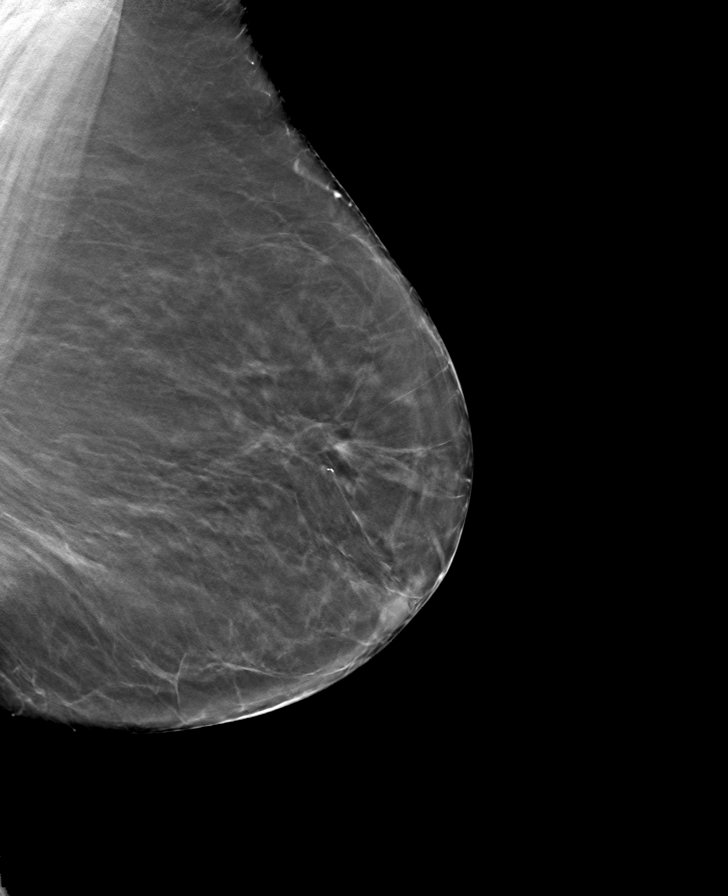

[L CC tomo · tomo slice 36/71.0]
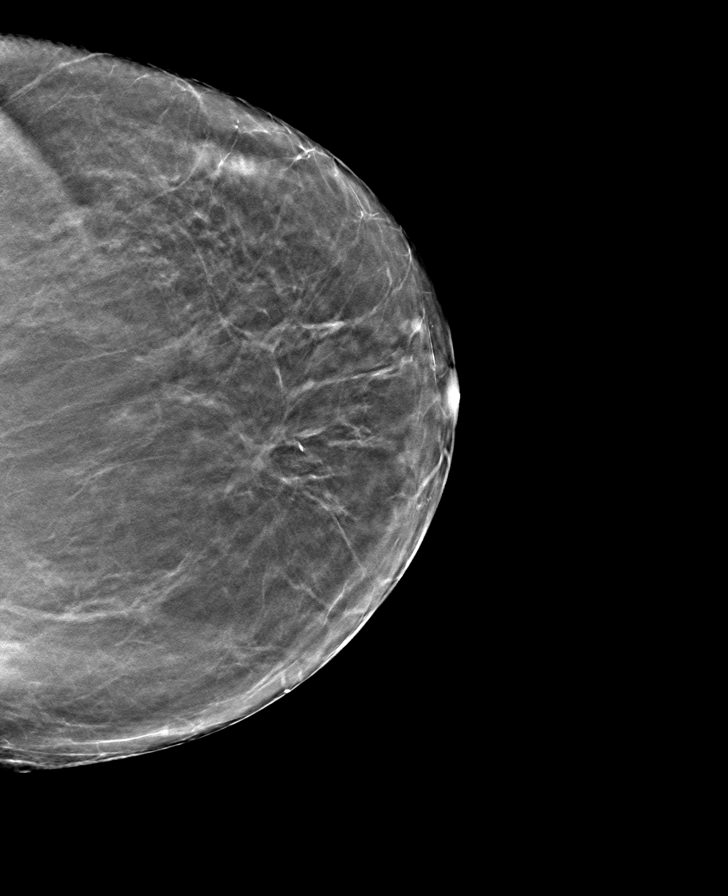

[R MLO tomo · tomo slice 39/77.0]
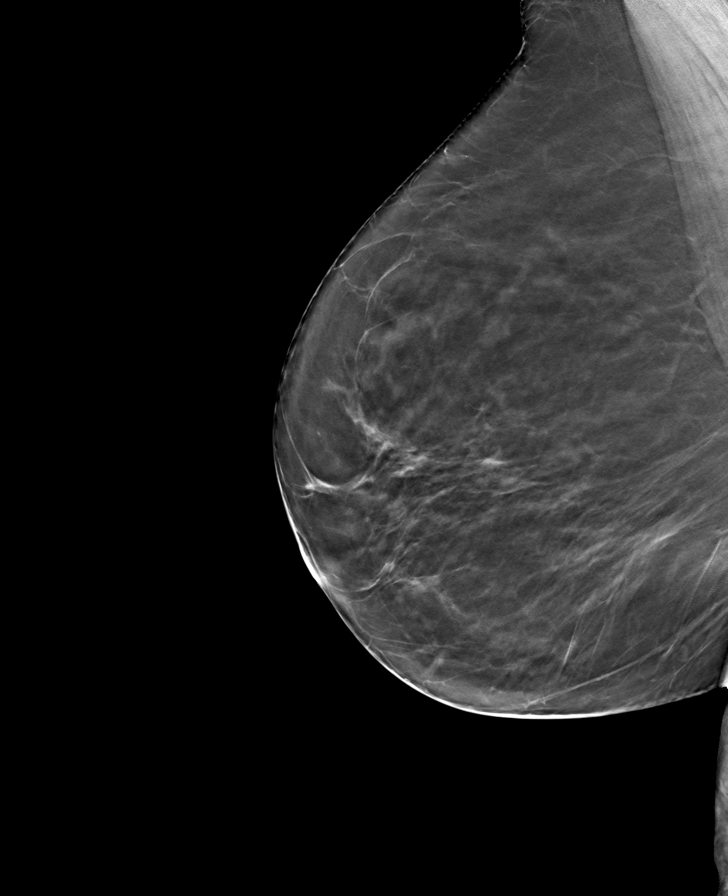

[R CC tomo · tomo slice 35/69.0]
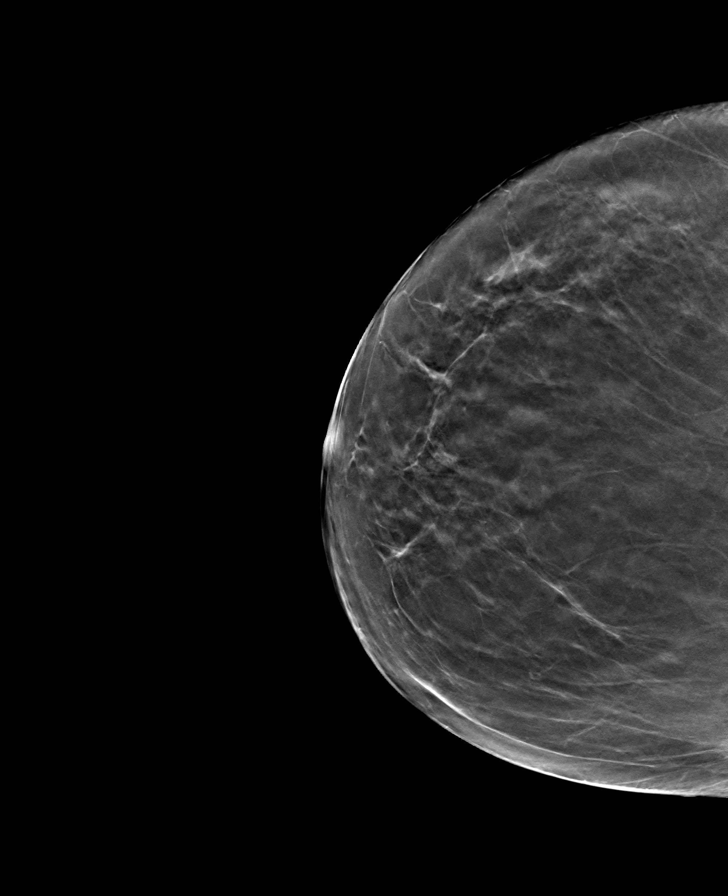

[8 of 24 positions shown; findings below may reference images not displayed]

ACR Breast Density Category b: There are scattered areas of
fibroglandular density.
FINDINGS: There are no findings suspicious for malignancy. Images were
processed with CAD.
IMPRESSION: No mammographic evidence of malignancy. A result letter of this
screening mammogram will be mailed directly to the patient.

RECOMMENDATION:
Screening mammogram in one year. (Code:[id number])

BI-RADS CATEGORY  1: Negative.

## 2020-07-20 DIAGNOSIS — I10 Essential (primary) hypertension: Secondary | ICD-10-CM | POA: Diagnosis not present

## 2020-07-20 DIAGNOSIS — E7849 Other hyperlipidemia: Secondary | ICD-10-CM | POA: Diagnosis not present

## 2020-07-20 DIAGNOSIS — M67969 Unspecified disorder of synovium and tendon, unspecified lower leg: Secondary | ICD-10-CM | POA: Diagnosis not present

## 2020-07-20 DIAGNOSIS — E1165 Type 2 diabetes mellitus with hyperglycemia: Secondary | ICD-10-CM | POA: Diagnosis not present

## 2020-07-20 DIAGNOSIS — M1712 Unilateral primary osteoarthritis, left knee: Secondary | ICD-10-CM | POA: Diagnosis not present

## 2020-08-18 DIAGNOSIS — Z23 Encounter for immunization: Secondary | ICD-10-CM | POA: Diagnosis not present

## 2020-08-20 DIAGNOSIS — M1712 Unilateral primary osteoarthritis, left knee: Secondary | ICD-10-CM | POA: Diagnosis not present

## 2020-08-20 DIAGNOSIS — E1165 Type 2 diabetes mellitus with hyperglycemia: Secondary | ICD-10-CM | POA: Diagnosis not present

## 2020-08-20 DIAGNOSIS — I10 Essential (primary) hypertension: Secondary | ICD-10-CM | POA: Diagnosis not present

## 2020-08-20 DIAGNOSIS — E7849 Other hyperlipidemia: Secondary | ICD-10-CM | POA: Diagnosis not present

## 2020-09-18 DIAGNOSIS — E7849 Other hyperlipidemia: Secondary | ICD-10-CM | POA: Diagnosis not present

## 2020-09-18 DIAGNOSIS — I10 Essential (primary) hypertension: Secondary | ICD-10-CM | POA: Diagnosis not present

## 2020-09-18 DIAGNOSIS — E1165 Type 2 diabetes mellitus with hyperglycemia: Secondary | ICD-10-CM | POA: Diagnosis not present

## 2020-09-18 DIAGNOSIS — M1712 Unilateral primary osteoarthritis, left knee: Secondary | ICD-10-CM | POA: Diagnosis not present

## 2020-10-18 DIAGNOSIS — M1712 Unilateral primary osteoarthritis, left knee: Secondary | ICD-10-CM | POA: Diagnosis not present

## 2020-10-18 DIAGNOSIS — E7849 Other hyperlipidemia: Secondary | ICD-10-CM | POA: Diagnosis not present

## 2020-10-18 DIAGNOSIS — I10 Essential (primary) hypertension: Secondary | ICD-10-CM | POA: Diagnosis not present

## 2020-10-18 DIAGNOSIS — E1165 Type 2 diabetes mellitus with hyperglycemia: Secondary | ICD-10-CM | POA: Diagnosis not present

## 2020-11-17 DIAGNOSIS — E119 Type 2 diabetes mellitus without complications: Secondary | ICD-10-CM | POA: Diagnosis not present

## 2020-11-17 DIAGNOSIS — Z1331 Encounter for screening for depression: Secondary | ICD-10-CM | POA: Diagnosis not present

## 2020-11-17 DIAGNOSIS — Z6834 Body mass index (BMI) 34.0-34.9, adult: Secondary | ICD-10-CM | POA: Diagnosis not present

## 2020-11-17 DIAGNOSIS — E6609 Other obesity due to excess calories: Secondary | ICD-10-CM | POA: Diagnosis not present

## 2020-11-17 DIAGNOSIS — Z0001 Encounter for general adult medical examination with abnormal findings: Secondary | ICD-10-CM | POA: Diagnosis not present

## 2020-11-17 DIAGNOSIS — Z0000 Encounter for general adult medical examination without abnormal findings: Secondary | ICD-10-CM | POA: Diagnosis not present

## 2020-11-17 DIAGNOSIS — Z1389 Encounter for screening for other disorder: Secondary | ICD-10-CM | POA: Diagnosis not present

## 2020-11-17 DIAGNOSIS — E7849 Other hyperlipidemia: Secondary | ICD-10-CM | POA: Diagnosis not present

## 2020-12-18 DIAGNOSIS — M1712 Unilateral primary osteoarthritis, left knee: Secondary | ICD-10-CM | POA: Diagnosis not present

## 2020-12-18 DIAGNOSIS — E7849 Other hyperlipidemia: Secondary | ICD-10-CM | POA: Diagnosis not present

## 2020-12-18 DIAGNOSIS — I10 Essential (primary) hypertension: Secondary | ICD-10-CM | POA: Diagnosis not present

## 2020-12-18 DIAGNOSIS — E1165 Type 2 diabetes mellitus with hyperglycemia: Secondary | ICD-10-CM | POA: Diagnosis not present

## 2021-01-18 DIAGNOSIS — E7849 Other hyperlipidemia: Secondary | ICD-10-CM | POA: Diagnosis not present

## 2021-01-18 DIAGNOSIS — I10 Essential (primary) hypertension: Secondary | ICD-10-CM | POA: Diagnosis not present

## 2021-01-18 DIAGNOSIS — E1165 Type 2 diabetes mellitus with hyperglycemia: Secondary | ICD-10-CM | POA: Diagnosis not present

## 2021-01-18 DIAGNOSIS — M1712 Unilateral primary osteoarthritis, left knee: Secondary | ICD-10-CM | POA: Diagnosis not present

## 2021-01-25 ENCOUNTER — Ambulatory Visit
Admission: EM | Admit: 2021-01-25 | Discharge: 2021-01-25 | Disposition: A | Discharge status: Home / Self Care | Payer: Medicare Other

## 2021-01-25 ENCOUNTER — Encounter: Payer: Self-pay | Admitting: Emergency Medicine

## 2021-01-25 ENCOUNTER — Other Ambulatory Visit: Payer: Self-pay

## 2021-01-25 DIAGNOSIS — J0140 Acute pansinusitis, unspecified: Secondary | ICD-10-CM

## 2021-01-25 DIAGNOSIS — R059 Cough, unspecified: Secondary | ICD-10-CM

## 2021-01-25 MED ORDER — BENZONATATE 100 MG PO CAPS: 200.0000 mg | ORAL_CAPSULE | Freq: Three times a day (TID) | ORAL | 0 refills | Status: DC | PRN

## 2021-01-25 MED ORDER — AMOXICILLIN-POT CLAVULANATE 875-125 MG PO TABS: 1.0000 | ORAL_TABLET | Freq: Two times a day (BID) | ORAL | 0 refills | Status: AC

## 2021-01-25 NOTE — ED Triage Notes (Signed)
Sinus pressure and congestion since Sunday.  States she thinks her face is swollen

## 2021-01-25 NOTE — Discharge Instructions (Signed)
I have sent in Augmentin for you to take twice a day for 7 days.  I have sent in tessalon perles for you to use one capsule every 8 hours as needed for cough.  Follow up with this office or with primary care if symptoms are persisting.  Follow up in the ER for high fever, trouble swallowing, trouble breathing, other concerning symptoms.  

## 2021-01-28 NOTE — ED Provider Notes (Signed)
[facility]   [id number] 01/25/21 Arrival Time: 1005  VQ:[id number] THROAT  SUBJECTIVE: History from: patient.  TAYLAH DUBIEL is a 70 y.o. female who presents with abrupt onset of nasal congestion, headache, fatigue for 3 days. Reports cold symptoms for about a week prior. Denies sick exposure to Covid, strep, flu or mono, or precipitating event. Has negative history of Covid. Has not had Covid vaccines. Has tried OTC cough and cold without relief.  There are no aggravating symptoms. Denies previous symptoms in the past.     Denies fever, chills, ear pain, rhinorrhea,  SOB, wheezing, chest pain, nausea, rash, changes in bowel or bladder habits.    ROS: As per HPI.  All other pertinent ROS negative.     Past Medical History:  Diagnosis Date   Arthritis    Complication of anesthesia    Diabetes mellitus without complication (HCC)    Hyperlipidemia    Hypertension    PONV (postoperative nausea and vomiting)    Past Surgical History:  Procedure Laterality Date   arthroscopic surgery Left    BREAST SURGERY Left    1998   cataract surgery     DILATION AND CURETTAGE OF UTERUS     extra digit removed     thumb   FOOT SURGERY Right    KNEE ARTHROPLASTY Right 05/28/2017   Procedure: COMPUTER ASSISTED TOTAL KNEE ARTHROPLASTY;  Surgeon: Samson Frederic, MD;  Location: [facility] OR;  Service: Orthopedics;  Laterality: Right;   REFRACTIVE SURGERY     both eyes   TOTAL KNEE ARTHROPLASTY Left 05/14/2015   Procedure: LEFT TOTAL KNEE ARTHROPLASTY;  Surgeon: Samson Frederic, MD;  Location: [facility] OR[facility];  Service: Orthopedics;  Laterality: Left;   TUBAL LIGATION     No Known Allergies No current facility-administered medications on file prior to encounter.   Current Outpatient Medications on File Prior to Encounter  Medication Sig Dispense Refill   calcium citrate (CALCITRATE - DOSED IN MG ELEMENTAL CALCIUM) 950 (200 Ca) MG tablet Take 200 mg of elemental calcium by mouth daily.     loratadine  (CLARITIN) 10 MG tablet Take 10 mg by mouth daily.     meloxicam (MOBIC) 7.5 MG tablet Take 7.5 mg by mouth as needed for pain.     methocarbamol (ROBAXIN) 500 MG tablet Take 500 mg by mouth every 6 (six) hours as needed for muscle spasms.     atorvastatin (LIPITOR) 20 MG tablet Take 20 mg by mouth at bedtime.     calcium-vitamin D (OSCAL 500/200 D-3) 500-200 MG-UNIT per tablet Take 1 tablet by mouth daily with lunch.      dimenhyDRINATE (DRAMAMINE) 50 MG tablet Take 25 mg by mouth every 8 (eight) hours as needed for dizziness.     Fexofenadine-Pseudoephedrine (ALLEGRA-D 12 HOUR PO) Take 1 tablet by mouth daily as needed (for allergies).     fluticasone (FLONASE) 50 MCG/ACT nasal spray Place 1 spray into both nostrils at bedtime.     glimepiride (AMARYL) 1 MG tablet Take 2 tablets by mouth at bedtime.     Homeopathic Products (EARACHE DROPS OT) Place 1-2 drops in ear(s) daily as needed (for sinus/ear pain.).     hydrochlorothiazide (HYDRODIURIL) 25 MG tablet Take 25 mg by mouth daily with lunch.      losartan (COZAAR) 50 MG tablet Take 50 mg by mouth 2 (two) times daily.      Menthol, Topical Analgesic, (BIOFREEZE EX) Apply 1-2 application topically 3 (three) times daily as needed (for  knee pain.(DOES NOT USE WITH HORSE LINIMENT)). ROLL ON FORMULATION     metFORMIN (GLUCOPHAGE-XR) 500 MG 24 hr tablet Take 500 mg by mouth 2 (two) times daily.     Methylcellulose, Laxative, (CITRUCEL) 500 MG TABS Take 500 mg by mouth at bedtime.     nabumetone (RELAFEN) 500 MG tablet Take 500 mg by mouth 2 (two) times daily.      nystatin cream (MYCOSTATIN) Apply 1 application topically 2 (two) times daily. Apply to affected area BID for up to 7 days. 30 g 0   potassium chloride (K-DUR,KLOR-CON) 10 MEQ tablet Take 10 mEq by mouth daily.      Tetrahydrozoline HCl (VISINE EXTRA OP) Place 1-2 drops into both eyes 3 (three) times daily as needed (dry eyes.).     triamcinolone (KENALOG) 0.025 % ointment Apply 1  application topically 2 (two) times daily. 30 g 0   Social History   Socioeconomic History   Marital status: Married    Spouse name: Not on file   Number of children: Not on file   Years of education: Not on file   Highest education level: Not on file  Occupational History   Not on file  Tobacco Use   Smoking status: Never   Smokeless tobacco: Never  Vaping Use   Vaping Use: Never used  Substance and Sexual Activity   Alcohol use: No   Drug use: No   Sexual activity: Not Currently    Partners: Male    Birth control/protection: Surgical    Comment: btl  Other Topics Concern   Not on file  Social History Narrative   Not on file   Social Determinants of Health   Financial Resource Strain: Not on file  Food Insecurity: Not on file  Transportation Needs: Not on file  Physical Activity: Not on file  Stress: Not on file  Social Connections: Not on file  Intimate Partner Violence: Not on file   Family History  Problem Relation Age of Onset   Cancer Mother        liver   Hypertension Mother    Heart disease Mother        CVD   Cancer Father        lymphoma    OBJECTIVE:  Vitals:   01/25/21 1019  BP: (!) 176/81  Pulse: 73  Resp: 18  Temp: (!) 97.5 F (36.4 C)  TempSrc: Tympanic  SpO2: 94%     General appearance: alert; appears fatigued, but nontoxic, speaking in full sentences and managing own secretions HEENT: NCAT; Ears: EACs clear, TMs pearly gray with visible cone of light, without erythema; Eyes: PERRL, EOMI grossly; Nose: no obvious rhinorrhea; Throat: oropharynx clear, tonsils 1+ and mildly erythematous without white tonsillar exudates, uvula midline; Sinuses: tender to palpation Neck: supple with LAD Lungs: CTA bilaterally without adventitious breath sounds; mild cough in office Heart: regular rate and rhythm.  Radial pulses 2+ symmetrical bilaterally Skin: warm and dry Psychological: alert and cooperative; normal mood and affect  LABS: No results  found for this or any previous visit (from the past 24 hour(s)).   ASSESSMENT & PLAN:  1. Acute non-recurrent pansinusitis   2. Cough     Meds ordered this encounter  Medications   amoxicillin-clavulanate (AUGMENTIN) 875-125 MG tablet    Sig: Take 1 tablet by mouth 2 (two) times daily for 7 days.    Dispense:  14 tablet    Refill:  0    Order Specific Question:  Supervising Provider    Answer:   Merrilee Jansky [[id number]]   benzonatate (TESSALON PERLES) 100 MG capsule    Sig: Take 2 capsules (200 mg total) by mouth 3 (three) times daily as needed for cough.    Dispense:  20 capsule    Refill:  0    Order Specific Question:   Supervising Provider    Answer:   Merrilee Jansky [id number]    Acute Sinusitis Push fluids and get rest Prescribed Amox-Clav 875mg  twice daily for 7 days.   Prescribed tessalon perles for cough prn Take as directed and to completion.  Drink warm or cool liquids, use throat lozenges, or popsicles to help alleviate symptoms Take OTC ibuprofen or tylenol as needed for pain May use Zyrtec D and flonase to help alleviate symptoms Follow up with PCP if symptoms persist Return or go to ER if you have any new or worsening symptoms such as fever, chills, nausea, vomiting, worsening sore throat, cough, abdominal pain, chest pain, changes in bowel or bladder habits. Declines Covid screen today  Reviewed expectations re: course of current medical issues. Questions answered. Outlined signs and symptoms indicating need for more acute intervention. Patient verbalized understanding. After Visit Summary given.           , NP 01/28/21 [phone]

## 2021-02-17 DIAGNOSIS — E1165 Type 2 diabetes mellitus with hyperglycemia: Secondary | ICD-10-CM | POA: Diagnosis not present

## 2021-02-17 DIAGNOSIS — E782 Mixed hyperlipidemia: Secondary | ICD-10-CM | POA: Diagnosis not present

## 2021-02-17 DIAGNOSIS — I10 Essential (primary) hypertension: Secondary | ICD-10-CM | POA: Diagnosis not present

## 2021-02-18 ENCOUNTER — Encounter: Payer: Self-pay | Admitting: Emergency Medicine

## 2021-02-18 ENCOUNTER — Other Ambulatory Visit: Payer: Self-pay

## 2021-02-18 ENCOUNTER — Ambulatory Visit
Admission: EM | Admit: 2021-02-18 | Discharge: 2021-02-18 | Disposition: A | Discharge status: Home / Self Care | Payer: Medicare Other | Attending: Emergency Medicine | Admitting: Emergency Medicine

## 2021-02-18 DIAGNOSIS — R0981 Nasal congestion: Secondary | ICD-10-CM | POA: Diagnosis not present

## 2021-02-18 DIAGNOSIS — H1132 Conjunctival hemorrhage, left eye: Secondary | ICD-10-CM

## 2021-02-18 DIAGNOSIS — R220 Localized swelling, mass and lump, head: Secondary | ICD-10-CM

## 2021-02-18 MED ORDER — AMOXICILLIN-POT CLAVULANATE 875-125 MG PO TABS
1.0000 | ORAL_TABLET | Freq: Two times a day (BID) | ORAL | 0 refills | Status: AC
Start: 2021-02-18 — End: 2021-02-28

## 2021-02-18 MED ORDER — PREDNISONE 20 MG PO TABS: 20.0000 mg | ORAL_TABLET | Freq: Two times a day (BID) | ORAL | 0 refills | Status: AC

## 2021-02-18 NOTE — ED Provider Notes (Signed)
[facility]   [id number] 02/18/21 Arrival Time: 0957   CC: COVID symptoms  SUBJECTIVE: History from: patient.  NUR RABOLD is a 70 y.o. female who presents with intermittent sinus congestion and pressure for the past month.  Dx'ed with covid.  Also reports ruptured blood vessel LT eye.  Treated with antiviral medication.  Denies aggravating factors.  Denies previous symptoms in the past.   Reports LT cheek swelling, runny nose, and dry cough.  Denies fever, chills, fatigue, sore throat, SOB, wheezing, chest pain, nausea, changes in bowel or bladder habits.     ROS: As per HPI.  All other pertinent ROS negative.     Past Medical History:  Diagnosis Date   Arthritis    Complication of anesthesia    Diabetes mellitus without complication (HCC)    Hyperlipidemia    Hypertension    PONV (postoperative nausea and vomiting)    Past Surgical History:  Procedure Laterality Date   arthroscopic surgery Left    BREAST SURGERY Left    1998   cataract surgery     DILATION AND CURETTAGE OF UTERUS     extra digit removed     thumb   FOOT SURGERY Right    KNEE ARTHROPLASTY Right 05/28/2017   Procedure: COMPUTER ASSISTED TOTAL KNEE ARTHROPLASTY;  Surgeon: Samson Frederic, MD;  Location: [facility] OR;  Service: Orthopedics;  Laterality: Right;   REFRACTIVE SURGERY     both eyes   TOTAL KNEE ARTHROPLASTY Left 05/14/2015   Procedure: LEFT TOTAL KNEE ARTHROPLASTY;  Surgeon: Samson Frederic, MD;  Location: [facility];  Service: Orthopedics;  Laterality: Left;   TUBAL LIGATION     No Known Allergies No current facility-administered medications on file prior to encounter.   Current Outpatient Medications on File Prior to Encounter  Medication Sig Dispense Refill   atorvastatin (LIPITOR) 20 MG tablet Take 20 mg by mouth at bedtime.     benzonatate (TESSALON PERLES) 100 MG capsule Take 2 capsules (200 mg total) by mouth 3 (three) times daily as needed for cough. 20 capsule 0   calcium citrate  (CALCITRATE - DOSED IN MG ELEMENTAL CALCIUM) 950 (200 Ca) MG tablet Take 200 mg of elemental calcium by mouth daily.     calcium-vitamin D (OSCAL 500/200 D-3) 500-200 MG-UNIT per tablet Take 1 tablet by mouth daily with lunch.      dimenhyDRINATE (DRAMAMINE) 50 MG tablet Take 25 mg by mouth every 8 (eight) hours as needed for dizziness.     Fexofenadine-Pseudoephedrine (ALLEGRA-D 12 HOUR PO) Take 1 tablet by mouth daily as needed (for allergies).     fluticasone (FLONASE) 50 MCG/ACT nasal spray Place 1 spray into both nostrils at bedtime.     glimepiride (AMARYL) 1 MG tablet Take 2 tablets by mouth at bedtime.     Homeopathic Products (EARACHE DROPS OT) Place 1-2 drops in ear(s) daily as needed (for sinus/ear pain.).     hydrochlorothiazide (HYDRODIURIL) 25 MG tablet Take 25 mg by mouth daily with lunch.      loratadine (CLARITIN) 10 MG tablet Take 10 mg by mouth daily.     losartan (COZAAR) 50 MG tablet Take 50 mg by mouth 2 (two) times daily.      meloxicam (MOBIC) 7.5 MG tablet Take 7.5 mg by mouth as needed for pain.     Menthol, Topical Analgesic, (BIOFREEZE EX) Apply 1-2 application topically 3 (three) times daily as needed (for knee pain.(DOES NOT USE WITH HORSE LINIMENT)). ROLL ON FORMULATION  metFORMIN (GLUCOPHAGE-XR) 500 MG 24 hr tablet Take 500 mg by mouth 2 (two) times daily.     methocarbamol (ROBAXIN) 500 MG tablet Take 500 mg by mouth every 6 (six) hours as needed for muscle spasms.     Methylcellulose, Laxative, (CITRUCEL) 500 MG TABS Take 500 mg by mouth at bedtime.     nabumetone (RELAFEN) 500 MG tablet Take 500 mg by mouth 2 (two) times daily.      nystatin cream (MYCOSTATIN) Apply 1 application topically 2 (two) times daily. Apply to affected area BID for up to 7 days. 30 g 0   potassium chloride (K-DUR,KLOR-CON) 10 MEQ tablet Take 10 mEq by mouth daily.      Tetrahydrozoline HCl (VISINE EXTRA OP) Place 1-2 drops into both eyes 3 (three) times daily as needed (dry eyes.).      triamcinolone (KENALOG) 0.025 % ointment Apply 1 application topically 2 (two) times daily. 30 g 0   Social History   Socioeconomic History   Marital status: Married    Spouse name: Not on file   Number of children: Not on file   Years of education: Not on file   Highest education level: Not on file  Occupational History   Not on file  Tobacco Use   Smoking status: Never   Smokeless tobacco: Never  Vaping Use   Vaping Use: Never used  Substance and Sexual Activity   Alcohol use: No   Drug use: No   Sexual activity: Not Currently    Partners: Male    Birth control/protection: Surgical    Comment: btl  Other Topics Concern   Not on file  Social History Narrative   Not on file   Social Determinants of Health   Financial Resource Strain: Not on file  Food Insecurity: Not on file  Transportation Needs: Not on file  Physical Activity: Not on file  Stress: Not on file  Social Connections: Not on file  Intimate Partner Violence: Not on file   Family History  Problem Relation Age of Onset   Cancer Mother        liver   Hypertension Mother    Heart disease Mother        CVD   Cancer Father        lymphoma    OBJECTIVE:  Vitals:   02/18/21 1020  BP: (!) 170/96  Pulse: 80  Resp: 16  Temp: (!) 97.5 F (36.4 C)  TempSrc: Tympanic  SpO2: 96%    General appearance: alert; well-appearing, nontoxic; speaking in full sentences and tolerating own secretions HEENT: NCAT; Ears: EACs clear, TMs pearly gray; Eyes: PERRL.  EOM grossly intact, subconjunctival hemorrhage LT eye in 5 o'clock position; Nose: nares patent without rhinorrhea, Throat: oropharynx clear, tonsils non erythematous or enlarged, uvula midline  Neck: supple without LAD Lungs: unlabored respirations, symmetrical air entry; cough: absent; no respiratory distress; CTAB Heart: regular rate and rhythm.  Skin: warm and dry Psychological: alert and cooperative; normal mood and affect   ASSESSMENT &  PLAN:  1. Sinus congestion   2. Facial swelling   3. Subconjunctival hemorrhage of left eye     Meds ordered this encounter  Medications   predniSONE (DELTASONE) 20 MG tablet    Sig: Take 1 tablet (20 mg total) by mouth 2 (two) times daily with a meal for 5 days.    Dispense:  10 tablet    Refill:  0    Order Specific Question:   Supervising Provider  AnswerEustace Moore [[id number]]   amoxicillin-clavulanate (AUGMENTIN) 875-125 MG tablet    Sig: Take 1 tablet by mouth every 12 (twelve) hours for 10 days.    Dispense:  20 tablet    Refill:  0    Order Specific Question:   Supervising Provider    Answer:   Eustace Moore [[id number]]    Get plenty of rest and push fluids Augmentin for sinus infection Prednisone for swelling Use OTC zyrtec for nasal congestion, runny nose, and/or sore throat Use OTC flonase for nasal congestion and runny nose Use medications daily for symptom relief Use OTC medications like ibuprofen or tylenol as needed fever or pain Call or go to the ED if you have any new or worsening symptoms such as fever, worsening cough, shortness of breath, chest tightness, chest pain, turning blue, changes in mental status, etc...   Reviewed expectations re: course of current medical issues. Questions answered. Outlined signs and symptoms indicating need for more acute intervention. Patient verbalized understanding. After Visit Summary given.          Rennis Harding, PA-C 02/18/21 1053

## 2021-02-18 NOTE — Discharge Instructions (Addendum)
Get plenty of rest and push fluids Augmentin for sinus infection Prednisone for swelling Use OTC zyrtec for nasal congestion, runny nose, and/or sore throat Use OTC flonase for nasal congestion and runny nose Use medications daily for symptom relief Use OTC medications like ibuprofen or tylenol as needed fever or pain Call or go to the ED if you have any new or worsening symptoms such as fever, worsening cough, shortness of breath, chest tightness, chest pain, turning blue, changes in mental status, etc..Marland Kitchen

## 2021-02-18 NOTE — ED Triage Notes (Signed)
Pt states her sinus's are swollen and painful and now she has a busted blood vessel to LT eye.

## 2021-04-12 DIAGNOSIS — E6609 Other obesity due to excess calories: Secondary | ICD-10-CM | POA: Diagnosis not present

## 2021-04-12 DIAGNOSIS — Z6833 Body mass index (BMI) 33.0-33.9, adult: Secondary | ICD-10-CM | POA: Diagnosis not present

## 2021-04-12 DIAGNOSIS — E1165 Type 2 diabetes mellitus with hyperglycemia: Secondary | ICD-10-CM | POA: Diagnosis not present

## 2021-04-12 DIAGNOSIS — I10 Essential (primary) hypertension: Secondary | ICD-10-CM | POA: Diagnosis not present

## 2021-04-14 DIAGNOSIS — Z23 Encounter for immunization: Secondary | ICD-10-CM | POA: Diagnosis not present

## 2021-04-20 DIAGNOSIS — E7849 Other hyperlipidemia: Secondary | ICD-10-CM | POA: Diagnosis not present

## 2021-04-20 DIAGNOSIS — E1165 Type 2 diabetes mellitus with hyperglycemia: Secondary | ICD-10-CM | POA: Diagnosis not present

## 2021-04-20 DIAGNOSIS — I10 Essential (primary) hypertension: Secondary | ICD-10-CM | POA: Diagnosis not present

## 2021-05-20 DIAGNOSIS — I10 Essential (primary) hypertension: Secondary | ICD-10-CM | POA: Diagnosis not present

## 2021-05-20 DIAGNOSIS — E1165 Type 2 diabetes mellitus with hyperglycemia: Secondary | ICD-10-CM | POA: Diagnosis not present

## 2021-05-20 DIAGNOSIS — E782 Mixed hyperlipidemia: Secondary | ICD-10-CM | POA: Diagnosis not present

## 2021-06-24 DIAGNOSIS — Z23 Encounter for immunization: Secondary | ICD-10-CM | POA: Diagnosis not present

## 2021-07-20 DIAGNOSIS — I10 Essential (primary) hypertension: Secondary | ICD-10-CM | POA: Diagnosis not present

## 2021-07-20 DIAGNOSIS — E782 Mixed hyperlipidemia: Secondary | ICD-10-CM | POA: Diagnosis not present

## 2021-07-20 DIAGNOSIS — E1165 Type 2 diabetes mellitus with hyperglycemia: Secondary | ICD-10-CM | POA: Diagnosis not present

## 2021-10-18 DIAGNOSIS — E782 Mixed hyperlipidemia: Secondary | ICD-10-CM | POA: Diagnosis not present

## 2021-10-18 DIAGNOSIS — I10 Essential (primary) hypertension: Secondary | ICD-10-CM | POA: Diagnosis not present

## 2021-10-18 DIAGNOSIS — E1165 Type 2 diabetes mellitus with hyperglycemia: Secondary | ICD-10-CM | POA: Diagnosis not present

## 2021-10-25 DIAGNOSIS — E119 Type 2 diabetes mellitus without complications: Secondary | ICD-10-CM | POA: Diagnosis not present

## 2021-10-25 DIAGNOSIS — E1165 Type 2 diabetes mellitus with hyperglycemia: Secondary | ICD-10-CM | POA: Diagnosis not present

## 2022-01-18 DIAGNOSIS — E1165 Type 2 diabetes mellitus with hyperglycemia: Secondary | ICD-10-CM | POA: Diagnosis not present

## 2022-01-18 DIAGNOSIS — E782 Mixed hyperlipidemia: Secondary | ICD-10-CM | POA: Diagnosis not present

## 2022-01-18 DIAGNOSIS — I10 Essential (primary) hypertension: Secondary | ICD-10-CM | POA: Diagnosis not present

## 2022-04-12 DIAGNOSIS — E559 Vitamin D deficiency, unspecified: Secondary | ICD-10-CM | POA: Diagnosis not present

## 2022-04-12 DIAGNOSIS — E1165 Type 2 diabetes mellitus with hyperglycemia: Secondary | ICD-10-CM | POA: Diagnosis not present

## 2022-04-12 DIAGNOSIS — E782 Mixed hyperlipidemia: Secondary | ICD-10-CM | POA: Diagnosis not present

## 2022-04-12 DIAGNOSIS — Z0001 Encounter for general adult medical examination with abnormal findings: Secondary | ICD-10-CM | POA: Diagnosis not present

## 2022-04-12 DIAGNOSIS — I10 Essential (primary) hypertension: Secondary | ICD-10-CM | POA: Diagnosis not present

## 2022-04-12 DIAGNOSIS — E119 Type 2 diabetes mellitus without complications: Secondary | ICD-10-CM | POA: Diagnosis not present

## 2022-04-12 DIAGNOSIS — D518 Other vitamin B12 deficiency anemias: Secondary | ICD-10-CM | POA: Diagnosis not present

## 2022-04-12 DIAGNOSIS — E039 Hypothyroidism, unspecified: Secondary | ICD-10-CM | POA: Diagnosis not present

## 2022-04-20 DIAGNOSIS — E782 Mixed hyperlipidemia: Secondary | ICD-10-CM | POA: Diagnosis not present

## 2022-04-20 DIAGNOSIS — E1165 Type 2 diabetes mellitus with hyperglycemia: Secondary | ICD-10-CM | POA: Diagnosis not present

## 2022-04-20 DIAGNOSIS — I10 Essential (primary) hypertension: Secondary | ICD-10-CM | POA: Diagnosis not present

## 2022-07-20 DIAGNOSIS — I10 Essential (primary) hypertension: Secondary | ICD-10-CM | POA: Diagnosis not present

## 2022-07-20 DIAGNOSIS — E1165 Type 2 diabetes mellitus with hyperglycemia: Secondary | ICD-10-CM | POA: Diagnosis not present

## 2022-07-20 DIAGNOSIS — E782 Mixed hyperlipidemia: Secondary | ICD-10-CM | POA: Diagnosis not present

## 2022-08-02 DIAGNOSIS — I10 Essential (primary) hypertension: Secondary | ICD-10-CM | POA: Diagnosis not present

## 2022-08-02 DIAGNOSIS — L03119 Cellulitis of unspecified part of limb: Secondary | ICD-10-CM | POA: Diagnosis not present

## 2022-08-02 DIAGNOSIS — M1711 Unilateral primary osteoarthritis, right knee: Secondary | ICD-10-CM | POA: Diagnosis not present

## 2022-08-02 DIAGNOSIS — E1165 Type 2 diabetes mellitus with hyperglycemia: Secondary | ICD-10-CM | POA: Diagnosis not present

## 2022-08-02 DIAGNOSIS — E782 Mixed hyperlipidemia: Secondary | ICD-10-CM | POA: Diagnosis not present

## 2022-08-02 DIAGNOSIS — E781 Pure hyperglyceridemia: Secondary | ICD-10-CM | POA: Diagnosis not present

## 2022-08-20 DIAGNOSIS — I10 Essential (primary) hypertension: Secondary | ICD-10-CM | POA: Diagnosis not present

## 2022-08-20 DIAGNOSIS — E1165 Type 2 diabetes mellitus with hyperglycemia: Secondary | ICD-10-CM | POA: Diagnosis not present

## 2022-08-20 DIAGNOSIS — E782 Mixed hyperlipidemia: Secondary | ICD-10-CM | POA: Diagnosis not present

## 2022-09-20 DIAGNOSIS — E782 Mixed hyperlipidemia: Secondary | ICD-10-CM | POA: Diagnosis not present

## 2022-09-20 DIAGNOSIS — E1165 Type 2 diabetes mellitus with hyperglycemia: Secondary | ICD-10-CM | POA: Diagnosis not present

## 2022-09-20 DIAGNOSIS — I10 Essential (primary) hypertension: Secondary | ICD-10-CM | POA: Diagnosis not present

## 2022-10-19 DIAGNOSIS — E782 Mixed hyperlipidemia: Secondary | ICD-10-CM | POA: Diagnosis not present

## 2022-10-19 DIAGNOSIS — E1165 Type 2 diabetes mellitus with hyperglycemia: Secondary | ICD-10-CM | POA: Diagnosis not present

## 2022-10-19 DIAGNOSIS — I10 Essential (primary) hypertension: Secondary | ICD-10-CM | POA: Diagnosis not present

## 2022-11-02 DIAGNOSIS — E782 Mixed hyperlipidemia: Secondary | ICD-10-CM | POA: Diagnosis not present

## 2022-11-02 DIAGNOSIS — M1711 Unilateral primary osteoarthritis, right knee: Secondary | ICD-10-CM | POA: Diagnosis not present

## 2022-11-02 DIAGNOSIS — E1165 Type 2 diabetes mellitus with hyperglycemia: Secondary | ICD-10-CM | POA: Diagnosis not present

## 2022-11-02 DIAGNOSIS — I10 Essential (primary) hypertension: Secondary | ICD-10-CM | POA: Diagnosis not present

## 2022-12-19 DIAGNOSIS — E1165 Type 2 diabetes mellitus with hyperglycemia: Secondary | ICD-10-CM | POA: Diagnosis not present

## 2022-12-19 DIAGNOSIS — I10 Essential (primary) hypertension: Secondary | ICD-10-CM | POA: Diagnosis not present

## 2022-12-19 DIAGNOSIS — E782 Mixed hyperlipidemia: Secondary | ICD-10-CM | POA: Diagnosis not present

## 2023-01-19 DIAGNOSIS — E1159 Type 2 diabetes mellitus with other circulatory complications: Secondary | ICD-10-CM | POA: Diagnosis not present

## 2023-01-19 DIAGNOSIS — I10 Essential (primary) hypertension: Secondary | ICD-10-CM | POA: Diagnosis not present

## 2023-01-19 DIAGNOSIS — E782 Mixed hyperlipidemia: Secondary | ICD-10-CM | POA: Diagnosis not present

## 2023-01-19 DIAGNOSIS — E1165 Type 2 diabetes mellitus with hyperglycemia: Secondary | ICD-10-CM | POA: Diagnosis not present

## 2023-02-13 DIAGNOSIS — E039 Hypothyroidism, unspecified: Secondary | ICD-10-CM | POA: Diagnosis not present

## 2023-02-13 DIAGNOSIS — Z0001 Encounter for general adult medical examination with abnormal findings: Secondary | ICD-10-CM | POA: Diagnosis not present

## 2023-02-13 DIAGNOSIS — E559 Vitamin D deficiency, unspecified: Secondary | ICD-10-CM | POA: Diagnosis not present

## 2023-02-13 DIAGNOSIS — E1159 Type 2 diabetes mellitus with other circulatory complications: Secondary | ICD-10-CM | POA: Diagnosis not present

## 2023-02-13 DIAGNOSIS — I10 Essential (primary) hypertension: Secondary | ICD-10-CM | POA: Diagnosis not present

## 2023-02-13 DIAGNOSIS — E1142 Type 2 diabetes mellitus with diabetic polyneuropathy: Secondary | ICD-10-CM | POA: Diagnosis not present

## 2023-02-13 DIAGNOSIS — E782 Mixed hyperlipidemia: Secondary | ICD-10-CM | POA: Diagnosis not present

## 2023-02-13 DIAGNOSIS — E1165 Type 2 diabetes mellitus with hyperglycemia: Secondary | ICD-10-CM | POA: Diagnosis not present

## 2023-02-13 DIAGNOSIS — D518 Other vitamin B12 deficiency anemias: Secondary | ICD-10-CM | POA: Diagnosis not present

## 2023-02-18 DIAGNOSIS — E1165 Type 2 diabetes mellitus with hyperglycemia: Secondary | ICD-10-CM | POA: Diagnosis not present

## 2023-02-18 DIAGNOSIS — E1159 Type 2 diabetes mellitus with other circulatory complications: Secondary | ICD-10-CM | POA: Diagnosis not present

## 2023-02-18 DIAGNOSIS — I10 Essential (primary) hypertension: Secondary | ICD-10-CM | POA: Diagnosis not present

## 2023-02-18 DIAGNOSIS — E782 Mixed hyperlipidemia: Secondary | ICD-10-CM | POA: Diagnosis not present

## 2023-03-13 ENCOUNTER — Other Ambulatory Visit: Payer: Self-pay | Admitting: Internal Medicine

## 2023-03-13 DIAGNOSIS — Z1231 Encounter for screening mammogram for malignant neoplasm of breast: Secondary | ICD-10-CM

## 2023-03-19 ENCOUNTER — Ambulatory Visit: Admission: RE | Admit: 2023-03-19 | Payer: Medicare Other | Source: Ambulatory Visit

## 2023-03-19 DIAGNOSIS — Z1231 Encounter for screening mammogram for malignant neoplasm of breast: Secondary | ICD-10-CM | POA: Diagnosis not present

## 2023-04-21 DIAGNOSIS — E782 Mixed hyperlipidemia: Secondary | ICD-10-CM | POA: Diagnosis not present

## 2023-04-21 DIAGNOSIS — E1165 Type 2 diabetes mellitus with hyperglycemia: Secondary | ICD-10-CM | POA: Diagnosis not present

## 2023-04-21 DIAGNOSIS — E1142 Type 2 diabetes mellitus with diabetic polyneuropathy: Secondary | ICD-10-CM | POA: Diagnosis not present

## 2023-05-07 DIAGNOSIS — J0100 Acute maxillary sinusitis, unspecified: Secondary | ICD-10-CM | POA: Diagnosis not present

## 2023-05-17 DIAGNOSIS — E782 Mixed hyperlipidemia: Secondary | ICD-10-CM | POA: Diagnosis not present

## 2023-05-17 DIAGNOSIS — E1165 Type 2 diabetes mellitus with hyperglycemia: Secondary | ICD-10-CM | POA: Diagnosis not present

## 2023-05-17 DIAGNOSIS — J0100 Acute maxillary sinusitis, unspecified: Secondary | ICD-10-CM | POA: Diagnosis not present

## 2023-05-17 DIAGNOSIS — I10 Essential (primary) hypertension: Secondary | ICD-10-CM | POA: Diagnosis not present

## 2023-05-17 DIAGNOSIS — E1159 Type 2 diabetes mellitus with other circulatory complications: Secondary | ICD-10-CM | POA: Diagnosis not present

## 2023-05-17 DIAGNOSIS — E1142 Type 2 diabetes mellitus with diabetic polyneuropathy: Secondary | ICD-10-CM | POA: Diagnosis not present

## 2023-05-21 DIAGNOSIS — E782 Mixed hyperlipidemia: Secondary | ICD-10-CM | POA: Diagnosis not present

## 2023-05-21 DIAGNOSIS — E1165 Type 2 diabetes mellitus with hyperglycemia: Secondary | ICD-10-CM | POA: Diagnosis not present

## 2023-06-21 DIAGNOSIS — E782 Mixed hyperlipidemia: Secondary | ICD-10-CM | POA: Diagnosis not present

## 2023-06-21 DIAGNOSIS — E1142 Type 2 diabetes mellitus with diabetic polyneuropathy: Secondary | ICD-10-CM | POA: Diagnosis not present

## 2023-07-21 DIAGNOSIS — E782 Mixed hyperlipidemia: Secondary | ICD-10-CM | POA: Diagnosis not present

## 2023-07-21 DIAGNOSIS — E1142 Type 2 diabetes mellitus with diabetic polyneuropathy: Secondary | ICD-10-CM | POA: Diagnosis not present

## 2023-07-25 DIAGNOSIS — A084 Viral intestinal infection, unspecified: Secondary | ICD-10-CM | POA: Diagnosis not present

## 2023-07-27 ENCOUNTER — Inpatient Hospital Stay (HOSPITAL_COMMUNITY)
Admission: EM | Admit: 2023-07-27 | Discharge: 2023-08-06 | DRG: 372 | Disposition: A | Discharge status: Home Health | Payer: Medicare Other | Attending: Internal Medicine | Admitting: Internal Medicine

## 2023-07-27 ENCOUNTER — Other Ambulatory Visit: Payer: Self-pay

## 2023-07-27 ENCOUNTER — Encounter (HOSPITAL_COMMUNITY): Payer: Self-pay

## 2023-07-27 ENCOUNTER — Emergency Department (HOSPITAL_COMMUNITY): Payer: Medicare Other

## 2023-07-27 DIAGNOSIS — R197 Diarrhea, unspecified: Secondary | ICD-10-CM | POA: Diagnosis present

## 2023-07-27 DIAGNOSIS — I10 Essential (primary) hypertension: Secondary | ICD-10-CM | POA: Diagnosis not present

## 2023-07-27 DIAGNOSIS — K529 Noninfective gastroenteritis and colitis, unspecified: Secondary | ICD-10-CM | POA: Diagnosis not present

## 2023-07-27 DIAGNOSIS — E119 Type 2 diabetes mellitus without complications: Secondary | ICD-10-CM

## 2023-07-27 DIAGNOSIS — Z8249 Family history of ischemic heart disease and other diseases of the circulatory system: Secondary | ICD-10-CM | POA: Diagnosis not present

## 2023-07-27 DIAGNOSIS — I447 Left bundle-branch block, unspecified: Secondary | ICD-10-CM | POA: Diagnosis not present

## 2023-07-27 DIAGNOSIS — Z800 Family history of malignant neoplasm of digestive organs: Secondary | ICD-10-CM

## 2023-07-27 DIAGNOSIS — E871 Hypo-osmolality and hyponatremia: Secondary | ICD-10-CM | POA: Diagnosis not present

## 2023-07-27 DIAGNOSIS — R188 Other ascites: Secondary | ICD-10-CM | POA: Diagnosis not present

## 2023-07-27 DIAGNOSIS — Z794 Long term (current) use of insulin: Secondary | ICD-10-CM

## 2023-07-27 DIAGNOSIS — A0472 Enterocolitis due to Clostridium difficile, not specified as recurrent: Principal | ICD-10-CM | POA: Diagnosis present

## 2023-07-27 DIAGNOSIS — E876 Hypokalemia: Secondary | ICD-10-CM

## 2023-07-27 DIAGNOSIS — Z7984 Long term (current) use of oral hypoglycemic drugs: Secondary | ICD-10-CM

## 2023-07-27 DIAGNOSIS — E66811 Obesity, class 1: Secondary | ICD-10-CM | POA: Diagnosis present

## 2023-07-27 DIAGNOSIS — Z79899 Other long term (current) drug therapy: Secondary | ICD-10-CM

## 2023-07-27 DIAGNOSIS — K828 Other specified diseases of gallbladder: Secondary | ICD-10-CM | POA: Diagnosis not present

## 2023-07-27 DIAGNOSIS — Z96653 Presence of artificial knee joint, bilateral: Secondary | ICD-10-CM | POA: Diagnosis not present

## 2023-07-27 DIAGNOSIS — Z807 Family history of other malignant neoplasms of lymphoid, hematopoietic and related tissues: Secondary | ICD-10-CM

## 2023-07-27 DIAGNOSIS — E860 Dehydration: Secondary | ICD-10-CM | POA: Diagnosis not present

## 2023-07-27 DIAGNOSIS — E785 Hyperlipidemia, unspecified: Secondary | ICD-10-CM | POA: Diagnosis not present

## 2023-07-27 DIAGNOSIS — K5100 Ulcerative (chronic) pancolitis without complications: Secondary | ICD-10-CM

## 2023-07-27 DIAGNOSIS — N179 Acute kidney failure, unspecified: Principal | ICD-10-CM

## 2023-07-27 DIAGNOSIS — R000 Tachycardia, unspecified: Secondary | ICD-10-CM | POA: Diagnosis not present

## 2023-07-27 DIAGNOSIS — Z6831 Body mass index (BMI) 31.0-31.9, adult: Secondary | ICD-10-CM

## 2023-07-27 DIAGNOSIS — E1165 Type 2 diabetes mellitus with hyperglycemia: Secondary | ICD-10-CM | POA: Diagnosis not present

## 2023-07-27 DIAGNOSIS — M1990 Unspecified osteoarthritis, unspecified site: Secondary | ICD-10-CM | POA: Diagnosis not present

## 2023-07-27 DIAGNOSIS — E878 Other disorders of electrolyte and fluid balance, not elsewhere classified: Secondary | ICD-10-CM | POA: Diagnosis present

## 2023-07-27 LAB — URINALYSIS, ROUTINE W REFLEX MICROSCOPIC
Bilirubin Urine: NEGATIVE
Glucose, UA: NEGATIVE mg/dL
Hgb urine dipstick: NEGATIVE
Ketones, ur: 5 mg/dL — AB
Leukocytes,Ua: NEGATIVE
Nitrite: NEGATIVE
Protein, ur: 30 mg/dL — AB
Specific Gravity, Urine: 1.017 (ref 1.005–1.030)
pH: 5 (ref 5.0–8.0)

## 2023-07-27 LAB — CBC
HCT: 48.1 % — ABNORMAL HIGH (ref 36.0–46.0)
Hemoglobin: 15.3 g/dL — ABNORMAL HIGH (ref 12.0–15.0)
MCH: 25.5 pg — ABNORMAL LOW (ref 26.0–34.0)
MCHC: 31.8 g/dL (ref 30.0–36.0)
MCV: 80.3 fL (ref 80.0–100.0)
Platelets: 225 10*3/uL (ref 150–400)
RBC: 5.99 MIL/uL — ABNORMAL HIGH (ref 3.87–5.11)
RDW: 15.3 % (ref 11.5–15.5)
WBC: 18.5 10*3/uL — ABNORMAL HIGH (ref 4.0–10.5)
nRBC: 0 % (ref 0.0–0.2)

## 2023-07-27 LAB — COMPREHENSIVE METABOLIC PANEL
ALT: 29 U/L (ref 0–44)
AST: 38 U/L (ref 15–41)
Albumin: 3.3 g/dL — ABNORMAL LOW (ref 3.5–5.0)
Alkaline Phosphatase: 66 U/L (ref 38–126)
Anion gap: 18 — ABNORMAL HIGH (ref 5–15)
BUN: 30 mg/dL — ABNORMAL HIGH (ref 8–23)
CO2: 24 mmol/L (ref 22–32)
Calcium: 9.9 mg/dL (ref 8.9–10.3)
Chloride: 87 mmol/L — ABNORMAL LOW (ref 98–111)
Creatinine, Ser: 2.2 mg/dL — ABNORMAL HIGH (ref 0.44–1.00)
GFR, Estimated: 23 mL/min — ABNORMAL LOW (ref >60)
Glucose, Bld: 214 mg/dL — ABNORMAL HIGH (ref 70–99)
Potassium: 3.5 mmol/L (ref 3.5–5.1)
Sodium: 129 mmol/L — ABNORMAL LOW (ref 135–145)
Total Bilirubin: 0.7 mg/dL (ref <1.2)
Total Protein: 7 g/dL (ref 6.5–8.1)

## 2023-07-27 LAB — GLUCOSE, CAPILLARY: Glucose-Capillary: 159 mg/dL — ABNORMAL HIGH (ref 70–99)

## 2023-07-27 LAB — LIPASE, BLOOD: Lipase: 21 U/L (ref 11–51)

## 2023-07-27 LAB — PROCALCITONIN: Procalcitonin: 1.35 ng/mL

## 2023-07-27 MED ORDER — SODIUM CHLORIDE 0.9 % IV BOLUS
500.0000 mL | Freq: Once | INTRAVENOUS | Status: AC
  Administered 2023-07-27: 500 mL via INTRAVENOUS

## 2023-07-27 MED ORDER — INSULIN ASPART 100 UNIT/ML IJ SOLN
0.0000 [IU] | Freq: Three times a day (TID) | INTRAMUSCULAR | Status: DC
  Administered 2023-07-28: 1 [IU] via SUBCUTANEOUS
  Administered 2023-07-28 – 2023-07-30 (×6): 2 [IU] via SUBCUTANEOUS
  Administered 2023-07-30 – 2023-07-31 (×3): 3 [IU] via SUBCUTANEOUS
  Administered 2023-07-31: 2 [IU] via SUBCUTANEOUS
  Administered 2023-08-01 (×3): 3 [IU] via SUBCUTANEOUS
  Administered 2023-08-02: 2 [IU] via SUBCUTANEOUS
  Administered 2023-08-02: 5 [IU] via SUBCUTANEOUS
  Administered 2023-08-02 – 2023-08-03 (×2): 3 [IU] via SUBCUTANEOUS
  Administered 2023-08-03: 5 [IU] via SUBCUTANEOUS
  Administered 2023-08-03: 2 [IU] via SUBCUTANEOUS
  Administered 2023-08-04: 1 [IU] via SUBCUTANEOUS
  Administered 2023-08-05 – 2023-08-06 (×3): 2 [IU] via SUBCUTANEOUS

## 2023-07-27 MED ORDER — SODIUM CHLORIDE 0.9 % IV SOLN
2.0000 g | INTRAVENOUS | Status: DC
  Administered 2023-07-28 (×2): 2 g via INTRAVENOUS
  Filled 2023-07-27 (×2): qty 20

## 2023-07-27 MED ORDER — SODIUM CHLORIDE 0.9 % IV SOLN: INTRAVENOUS | Status: AC

## 2023-07-27 MED ORDER — DIGOXIN 0.25 MG/ML IJ SOLN
0.2500 mg | Freq: Once | INTRAMUSCULAR | Status: AC
  Administered 2023-07-27: 0.25 mg via INTRAVENOUS
  Filled 2023-07-27: qty 1

## 2023-07-27 MED ORDER — PIPERACILLIN-TAZOBACTAM 3.375 G IVPB 30 MIN
3.3750 g | Freq: Once | INTRAVENOUS | Status: AC
  Administered 2023-07-27: 3.375 g via INTRAVENOUS
  Filled 2023-07-27: qty 50

## 2023-07-27 MED ORDER — METRONIDAZOLE 500 MG/100ML IV SOLN
500.0000 mg | Freq: Two times a day (BID) | INTRAVENOUS | Status: DC
  Administered 2023-07-27 – 2023-07-29 (×4): 500 mg via INTRAVENOUS
  Filled 2023-07-27 (×4): qty 100

## 2023-07-27 MED ORDER — HYDRALAZINE HCL 20 MG/ML IJ SOLN: 5.0000 mg | Freq: Four times a day (QID) | INTRAMUSCULAR | Status: DC | PRN

## 2023-07-27 MED ORDER — ONDANSETRON HCL 4 MG/2ML IJ SOLN
4.0000 mg | Freq: Four times a day (QID) | INTRAMUSCULAR | Status: DC | PRN
  Administered 2023-08-01 – 2023-08-05 (×3): 4 mg via INTRAVENOUS
  Filled 2023-07-27 (×3): qty 2

## 2023-07-27 MED ORDER — ONDANSETRON HCL 4 MG/2ML IJ SOLN
4.0000 mg | Freq: Once | INTRAMUSCULAR | Status: AC
  Administered 2023-07-27: 4 mg via INTRAVENOUS
  Filled 2023-07-27: qty 2

## 2023-07-27 MED ORDER — HEPARIN SODIUM (PORCINE) 5000 UNIT/ML IJ SOLN
5000.0000 [IU] | Freq: Three times a day (TID) | INTRAMUSCULAR | Status: DC
  Administered 2023-07-27 – 2023-08-06 (×29): 5000 [IU] via SUBCUTANEOUS
  Filled 2023-07-27 (×29): qty 1

## 2023-07-27 MED ORDER — PRAVASTATIN SODIUM 10 MG PO TABS
20.0000 mg | ORAL_TABLET | Freq: Every day | ORAL | Status: DC
  Administered 2023-07-27 – 2023-08-06 (×11): 20 mg via ORAL
  Filled 2023-07-27 (×11): qty 2

## 2023-07-27 MED ORDER — INSULIN ASPART 100 UNIT/ML IJ SOLN
0.0000 [IU] | Freq: Every day | INTRAMUSCULAR | Status: DC
  Administered 2023-07-30 – 2023-08-01 (×3): 2 [IU] via SUBCUTANEOUS
  Administered 2023-08-02: 3 [IU] via SUBCUTANEOUS

## 2023-07-27 MED ORDER — METOPROLOL TARTRATE 25 MG PO TABS
25.0000 mg | ORAL_TABLET | Freq: Two times a day (BID) | ORAL | Status: DC
  Administered 2023-07-27 – 2023-07-31 (×8): 25 mg via ORAL
  Filled 2023-07-27 (×8): qty 1

## 2023-07-27 MED ORDER — LACTATED RINGERS IV BOLUS
1000.0000 mL | Freq: Once | INTRAVENOUS | Status: AC
  Administered 2023-07-27: 1000 mL via INTRAVENOUS

## 2023-07-27 NOTE — ED Notes (Signed)
Gave pt cup for urine and cup for stool  Pt will attempt tp use restroom

## 2023-07-27 NOTE — H&P (Signed)
TRH H&P   Patient Demographics:    Tracy Bonilla, is a 72 y.o. female  MRN: [id number]   DOB - [birth year]  Admit Date - 07/27/2023  Outpatient Primary MD for the patient is Elfredia Nevins, MD  Referring MD/NP/PA: PA Jari Favre  Patient coming from: home  Chief Complaint  Patient presents with   Diarrhea   sinus pressure   dry heaving   decreased appetite      HPI:    Tracy Bonilla  is a 72 y.o. female, with past medical history of hypertension, diabetes mellitus type 2, umbilical hernia, hyperlipidemia, patient presents to ED secondary to complaints of diarrhea, nausea, dehydration and poor oral intake, patient reports has been having diarrhea for last 5 days, some abdominal cramping, she denies any sick contact, vomiting, but reports poor oral intake, denies any bright red blood per rectum, she denies any sick contacts, reports most recent antibiotics used was September for otitis and ear infection -In ED labs significant for sodium 129, glucose 214, creatinine 2.2 with BUN of 30, her white blood cell count elevated at 18.5, CT abdomen pelvis was obtained significant for pancolitis, [facility] hospitalist consulted to admit.    Review of systems:      A full 10 point Review of Systems was done, except as stated above, all other Review of Systems were negative.   With Past History of the following :    Past Medical History:  Diagnosis Date   Arthritis    Complication of anesthesia    Diabetes mellitus without complication (HCC)    Hyperlipidemia    Hypertension    PONV (postoperative nausea and vomiting)       Past Surgical History:  Procedure Laterality Date   arthroscopic surgery Left    BREAST BIOPSY Left 1998   per pt benign   BREAST SURGERY Left    1998   cataract surgery     DILATION AND CURETTAGE OF UTERUS     extra digit removed     thumb   FOOT SURGERY Right     KNEE ARTHROPLASTY Right 05/28/2017   Procedure: COMPUTER ASSISTED TOTAL KNEE ARTHROPLASTY;  Surgeon: Samson Frederic, MD;  Location: [facility] OR;  Service: Orthopedics;  Laterality: Right;   REFRACTIVE SURGERY     both eyes   TOTAL KNEE ARTHROPLASTY Left 05/14/2015   Procedure: LEFT TOTAL KNEE ARTHROPLASTY;  Surgeon: Samson Frederic, MD;  Location: [facility];  Service: Orthopedics;  Laterality: Left;   TUBAL LIGATION        Social History:     Social History   Tobacco Use   Smoking status: Never   Smokeless tobacco: Never  Substance Use Topics   Alcohol use: No       Family History :     Family History  Problem Relation Age of Onset   Cancer Mother  liver   Hypertension Mother    Heart disease Mother        CVD   Cancer Father        lymphoma   Breast cancer Neg Hx      Home Medications:   Prior to Admission medications   Medication Sig Start Date End Date Taking? Authorizing Provider  calcium citrate (CALCITRATE - DOSED IN MG ELEMENTAL CALCIUM) 950 (200 Ca) MG tablet Take 200 mg of elemental calcium by mouth daily. W/ vit D   Yes [provider]  dimenhyDRINATE (DRAMAMINE) 50 MG tablet Take 25 mg by mouth every 8 (eight) hours as needed for dizziness.   Yes [provider]  gabapentin (NEURONTIN) 100 MG capsule Take 100-300 mg by mouth 3 (three) times daily. 100mg  am, 100mg  noon, 300mg  qhs 07/18/23  Yes [provider]  hydrochlorothiazide (HYDRODIURIL) 25 MG tablet Take 25 mg by mouth daily with lunch.  02/09/14  Yes [provider]  loperamide (IMODIUM) 2 MG capsule Take 2 mg by mouth as needed for diarrhea or loose stools.   Yes [provider]  loratadine (CLARITIN) 10 MG tablet Take 10 mg by mouth daily.   Yes [provider]  losartan (COZAAR) 50 MG tablet Take 50 mg by mouth 2 (two) times daily.    Yes [provider]  metFORMIN (GLUCOPHAGE-XR) 500 MG 24 hr tablet Take 500 mg by mouth 2 (two) times  daily. 05/10/17  Yes [provider]  ondansetron (ZOFRAN-ODT) 4 MG disintegrating tablet Take 4-8 mg by mouth every 6 (six) hours as needed for nausea or vomiting. 07/25/23  Yes [provider]  potassium chloride (KLOR-CON) 10 MEQ tablet Take 10 mEq by mouth daily. 05/11/23  Yes [provider]  pravastatin (PRAVACHOL) 20 MG tablet Take 20 mg by mouth daily.   Yes [provider]  glimepiride (AMARYL) 4 MG tablet Take 4 mg by mouth 2 (two) times daily. 04/26/23   [provider]     Allergies:    No Known Allergies   Physical Exam:   Vitals  Blood pressure 129/72, pulse (!) 115, temperature (!) 96.7 F (35.9 C), temperature source Axillary, resp. rate 16, height 5\' 3"  (1.6 m), weight 79.8 kg, last menstrual period 08/21/1988, SpO2 92%.   1. General frail elderly lying in bed in NAD,    2. Normal affect and insight, Not Suicidal or Homicidal, Awake Alert, Oriented X 3.  3. No F.N deficits, ALL C.Nerves Intact, Strength 5/5 all 4 extremities, Sensation intact all 4 extremities, Plantars down going.  4. Ears and Eyes appear Normal, Conjunctivae clear, PERRLA. Dry Oral Mucosa.  5. Supple Neck, No JVD, No cervical lymphadenopathy appriciated, No Carotid Bruits.  6. Symmetrical Chest wall movement, Good air movement bilaterally, CTAB.  7. RRR, No Gallops, Rubs or Murmurs, No Parasternal Heave.  8. Positive Bowel Sounds, Abdomen Soft,  minimal  tenderness to palpation , No organomegaly appriciated,No rebound -guarding or rigidity.  9.  No Cyanosis, delayed Skin Turgor, No Skin Rash or Bruise.  10. Good muscle tone,  joints appear normal , no effusions, Normal ROM.    Data Review:    CBC Recent Labs  Lab 07/27/23 1146  WBC 18.5*  HGB 15.3*  HCT 48.1*  PLT 225  MCV 80.3  MCH 25.5*  MCHC 31.8  RDW 15.3    ------------------------------------------------------------------------------------------------------------------  Chemistries  Recent Labs  Lab 07/27/23 1146  NA 129*  K 3.5  CL 87*  CO2  24  GLUCOSE 214*  BUN 30*  CREATININE 2.20*  CALCIUM 9.9  AST 38  ALT 29  ALKPHOS 66  BILITOT 0.7   ------------------------------------------------------------------------------------------------------------------ estimated creatinine clearance is 23.5 mL/min (A) (by C-G formula based on SCr of 2.2 mg/dL (H)). ------------------------------------------------------------------------------------------------------------------ No results for input(s): "TSH", "T4TOTAL", "T3FREE", "THYROIDAB" in the last 72 hours.  Invalid input(s): "FREET3"  Coagulation profile No results for input(s): "INR", "PROTIME" in the last 168 hours. ------------------------------------------------------------------------------------------------------------------- No results for input(s): "DDIMER" in the last 72 hours. -------------------------------------------------------------------------------------------------------------------  Cardiac Enzymes No results for input(s): "CKMB", "TROPONINI", "MYOGLOBIN" in the last 168 hours.  Invalid input(s): "CK" ------------------------------------------------------------------------------------------------------------------ No results found for: "BNP"   ---------------------------------------------------------------------------------------------------------------  Urinalysis    Component Value Date/Time   COLORURINE YELLOW 07/27/2023 1500   APPEARANCEUR HAZY (A) 07/27/2023 1500   LABSPEC 1.017 07/27/2023 1500   PHURINE 5.0 07/27/2023 1500   GLUCOSEU NEGATIVE 07/27/2023 1500   HGBUR NEGATIVE 07/27/2023 1500   BILIRUBINUR NEGATIVE 07/27/2023 1500   KETONESUR 5 (A) 07/27/2023 1500   PROTEINUR 30 (A) 07/27/2023 1500   UROBILINOGEN 0.2 05/06/2015 1349   NITRITE  NEGATIVE 07/27/2023 1500   LEUKOCYTESUR NEGATIVE 07/27/2023 1500    ----------------------------------------------------------------------------------------------------------------   Imaging Results:    CT ABDOMEN PELVIS WO CONTRAST  Result Date: 07/27/2023 CLINICAL DATA:  One-week history of diarrhea and dry heaving with decreased appetite EXAM: CT ABDOMEN AND PELVIS WITHOUT CONTRAST TECHNIQUE: Multidetector CT imaging of the abdomen and pelvis was performed following the standard protocol without IV contrast. RADIATION DOSE REDUCTION: This exam was performed according to the departmental dose-optimization program which includes automated exposure control, adjustment of the mA and/or kV according to patient size and/or use of iterative reconstruction technique. COMPARISON:  None Available. FINDINGS: Decreased sensitivity and specificity for detailed findings due to motion artifact. Lower chest: No focal consolidation or pulmonary nodule in the lung bases. No pleural effusion or pneumothorax demonstrated. Partially imaged heart size is normal. Hepatobiliary: No focal hepatic lesions. No intra or extrahepatic biliary ductal dilation. Mildly distended gallbladder with layering hyperattenuation, which may represent stones/sludge. Pancreas: No focal lesions or main ductal dilation. Spleen: Normal in size without focal abnormality. Adrenals/Urinary Tract: No adrenal nodules. No suspicious renal mass on this noncontrast enhanced examination, calculi or hydronephrosis. No focal bladder wall thickening. Stomach/Bowel: Normal appearance of the stomach. Diffusely patulous, featureless appearance of the colon with marked circumferential mural thickening. Normal appearance of the terminal ileum. Mild mural thickening of the appendiceal base. Vascular/Lymphatic: Aortic atherosclerosis. No enlarged abdominal or pelvic lymph nodes. Reproductive: No adnexal masses. Other: Small volume ascites.  No free air or fluid  collection. Musculoskeletal: No acute or abnormal lytic or blastic osseous lesions. Multilevel degenerative changes of the partially imaged thoracic and lumbar spine. Anterior wedging of L1. IMPRESSION: 1. Findings of pancolitis, which may be infectious or inflammatory in the acute setting. Given appearance of marked mural thickening and ascites, C diff colitis is within the differential. 2. Mild mural thickening of the appendiceal base, likely reactive. 3. Small volume ascites. 4. Mildly distended gallbladder with layering hyperattenuation, which may represent stones/sludge. 5.  Aortic Atherosclerosis (ICD10-I70.0). Electronically Signed   By: Agustin Cree M.D.   On: 07/27/2023 16:31    EKG:  Vent. rate 114 BPM PR interval 110 ms QRS duration 162 ms QT/QTcB 400/551 ms P-R-T axes 109 -65 108 Sinus tachycardia Left bundle branch block  Assessment & Plan:    Principal Problem:   Colitis Active Problems:   Essential hypertension   Type  2 diabetes mellitus (HCC)    Diarrhea/colitis -Presents with diarrhea x 7 days, mild abdominal pain, nausea and poor oral intake -Measuring significant for pancolitis, with concern for C. difficile, patient reports last antibiotic use was end of September -Started on IV Zosyn while in ED, will continue for next 24 hours pending C. difficile and GI panel results -Keep on full liquid diet advance as tolerated  AKI -due to volume depletion and dehydration -Continue with IV fluids and hold nephrotoxic medications  Hypertension -Her blood pressure is acceptable, but patient reports she has been holding her home meds including hydrochlorothiazide and lisinopril for last 2 days which I will continue to hold given her AKI -Hold metformin as well -Start on low-dose metoprolol for now given her sinus tachycardia  Sinus tachycardia Left bundle branch block -Patient is tachycardic, secondary to volume depletion and dehydration, will continue with IV fluids -EKG  with  left bundle branch block, appears to be new as compared to most recent EKG which is in 2018, so it is of unclear chronicity, she denies any chest pain or shortness of breath, I will just obtain 2D echo. -Will start on low-dose metoprolol to assist with tachycardia and blood pressure.  Diabetes mellitus - Hold her glimepiride and metformin for now, and will keep on insulin sliding scale  Weakness-deconditioning -Will consult PT   Hyponatremia -Due to volume depletion -Continue with IV fluids-  DVT Prophylaxis Heparin  AM Labs Ordered, also please review Full Orders  Family Communication: Admission, patients condition and plan of care including tests being ordered have been discussed with the patient  who indicate understanding and agree with the plan and Code Status.  Code Status full code  Likely DC to home all sandwiches  Consults called: none  Admission status: inpatient    Time spent in minutes : 75 minutes   Huey Bienenstock M.D on 07/27/2023 at 6:31 PM   [facility] - Office  [phone]

## 2023-07-27 NOTE — ED Notes (Signed)
Pt in CT.

## 2023-07-27 NOTE — ED Provider Notes (Signed)
 [place] EMERGENCY DEPARTMENT AT [facility] Provider Note   CSN: [id number] Arrival date & time: 07/27/23  1055     History Chief Complaint  Patient presents with   Diarrhea   sinus pressure   dry heaving   decreased appetite    Tracy Bonilla is a 72 y.o. female.  Patient with history significant for type 2 diabetes, hypertension, umbilical hernia presents to the emergency department with concerns of diarrhea, nausea, and dehydration.  States that she has been experiencing diarrhea for the last for 5 days.  No recent antibiotic use.  No sick contacts with individuals with similar symptoms.  States that she had a few medications prescribed by primary care provider for management of symptoms without improvement or level of nausea.  Does report that her diarrhea is slowing down and is only had a few bowel movements in the last day or so.  Denies any hematochezia, hematemesis, or melanotic stools.    Diarrhea      Home Medications Prior to Admission medications   Medication Sig Start Date End Date Taking? Authorizing Provider  calcium citrate (CALCITRATE - DOSED IN MG ELEMENTAL CALCIUM) 950 (200 Ca) MG tablet Take 200 mg of elemental calcium by mouth daily. W/ vit D   Yes [provider]  dimenhyDRINATE (DRAMAMINE) 50 MG tablet Take 25 mg by mouth every 8 (eight) hours as needed for dizziness.   Yes [provider]  gabapentin (NEURONTIN) 100 MG capsule Take 100-300 mg by mouth 3 (three) times daily. 100mg  am, 100mg  noon, 300mg  qhs 07/18/23  Yes [provider]  hydrochlorothiazide (HYDRODIURIL) 25 MG tablet Take 25 mg by mouth daily with lunch.  02/09/14  Yes [provider]  loperamide (IMODIUM) 2 MG capsule Take 2 mg by mouth as needed for diarrhea or loose stools.   Yes [provider]  loratadine (CLARITIN) 10 MG tablet Take 10 mg by mouth daily.   Yes [provider]  losartan (COZAAR) 50 MG tablet Take 50 mg by  mouth 2 (two) times daily.    Yes [provider]  metFORMIN (GLUCOPHAGE-XR) 500 MG 24 hr tablet Take 500 mg by mouth 2 (two) times daily. 05/10/17  Yes [provider]  ondansetron (ZOFRAN-ODT) 4 MG disintegrating tablet Take 4-8 mg by mouth every 6 (six) hours as needed for nausea or vomiting. 07/25/23  Yes [provider]  potassium chloride (KLOR-CON) 10 MEQ tablet Take 10 mEq by mouth daily. 05/11/23  Yes [provider]  pravastatin (PRAVACHOL) 20 MG tablet Take 20 mg by mouth daily.   Yes [provider]  glimepiride (AMARYL) 4 MG tablet Take 4 mg by mouth 2 (two) times daily. 04/26/23   [provider]      Allergies    Patient has no known allergies.    Review of Systems   Review of Systems  Gastrointestinal:  Positive for diarrhea.  All other systems reviewed and are negative.   Physical Exam Updated Vital Signs BP 129/72 (BP Location: Left Arm)   Pulse (!) 115   Temp (!) 96.7 F (35.9 C) (Axillary)   Resp 16   Ht 5\' 3"  (1.6 m)   Wt 79.8 kg   LMP 08/21/1988   SpO2 92%   BMI 31.18 kg/m  Physical Exam Vitals and nursing note reviewed.  Constitutional:      General: She is not in acute distress.    Appearance: She is well-developed.  HENT:  Head: Normocephalic and atraumatic.  Eyes:     Conjunctiva/sclera: Conjunctivae normal.  Cardiovascular:     Rate and Rhythm: Normal rate and regular rhythm.     Heart sounds: No murmur heard. Pulmonary:     Effort: Pulmonary effort is normal. No respiratory distress.     Breath sounds: Normal breath sounds.  Abdominal:     Palpations: Abdomen is soft.     Tenderness: There is abdominal tenderness.     Comments: Mild lower abdominal tenderness.  Musculoskeletal:        General: No swelling.     Cervical back: Neck supple.  Skin:    General: Skin is warm and dry.     Capillary Refill: Capillary refill takes less than 2 seconds.  Neurological:     Mental Status: She is  alert.  Psychiatric:        Mood and Affect: Mood normal.     ED Results / Procedures / Treatments   Labs (all labs ordered are listed, but only abnormal results are displayed) Labs Reviewed  COMPREHENSIVE METABOLIC PANEL - Abnormal; Notable for the following components:      Result Value   Sodium 129 (*)    Chloride 87 (*)    Glucose, Bld 214 (*)    BUN 30 (*)    Creatinine, Ser 2.20 (*)    Albumin 3.3 (*)    GFR, Estimated 23 (*)    Anion gap 18 (*)    All other components within normal limits  CBC - Abnormal; Notable for the following components:   WBC 18.5 (*)    RBC 5.99 (*)    Hemoglobin 15.3 (*)    HCT 48.1 (*)    MCH 25.5 (*)    All other components within normal limits  URINALYSIS, ROUTINE W REFLEX MICROSCOPIC - Abnormal; Notable for the following components:   APPearance HAZY (*)    Ketones, ur 5 (*)    Protein, ur 30 (*)    Bacteria, UA RARE (*)    All other components within normal limits  C DIFFICILE QUICK SCREEN W PCR REFLEX    GASTROINTESTINAL PANEL BY PCR, STOOL (REPLACES STOOL CULTURE)  CULTURE, BLOOD (ROUTINE X 2)  CULTURE, BLOOD (ROUTINE X 2)  C DIFFICILE QUICK SCREEN W PCR REFLEX    GASTROINTESTINAL PANEL BY PCR, STOOL (REPLACES STOOL CULTURE)  LIPASE, BLOOD  PROCALCITONIN    EKG None  Radiology CT ABDOMEN PELVIS WO CONTRAST  Result Date: 07/27/2023 CLINICAL DATA:  One-week history of diarrhea and dry heaving with decreased appetite EXAM: CT ABDOMEN AND PELVIS WITHOUT CONTRAST TECHNIQUE: Multidetector CT imaging of the abdomen and pelvis was performed following the standard protocol without IV contrast. RADIATION DOSE REDUCTION: This exam was performed according to the departmental dose-optimization program which includes automated exposure control, adjustment of the mA and/or kV according to patient size and/or use of iterative reconstruction technique. COMPARISON:  None Available. FINDINGS: Decreased sensitivity and specificity for detailed  findings due to motion artifact. Lower chest: No focal consolidation or pulmonary nodule in the lung bases. No pleural effusion or pneumothorax demonstrated. Partially imaged heart size is normal. Hepatobiliary: No focal hepatic lesions. No intra or extrahepatic biliary ductal dilation. Mildly distended gallbladder with layering hyperattenuation, which may represent stones/sludge. Pancreas: No focal lesions or main ductal dilation. Spleen: Normal in size without focal abnormality. Adrenals/Urinary Tract: No adrenal nodules. No suspicious renal mass on this noncontrast enhanced examination, calculi or hydronephrosis. No focal bladder wall thickening. Stomach/Bowel: Normal  appearance of the stomach. Diffusely patulous, featureless appearance of the colon with marked circumferential mural thickening. Normal appearance of the terminal ileum. Mild mural thickening of the appendiceal base. Vascular/Lymphatic: Aortic atherosclerosis. No enlarged abdominal or pelvic lymph nodes. Reproductive: No adnexal masses. Other: Small volume ascites.  No free air or fluid collection. Musculoskeletal: No acute or abnormal lytic or blastic osseous lesions. Multilevel degenerative changes of the partially imaged thoracic and lumbar spine. Anterior wedging of L1. IMPRESSION: 1. Findings of pancolitis, which may be infectious or inflammatory in the acute setting. Given appearance of marked mural thickening and ascites, C diff colitis is within the differential. 2. Mild mural thickening of the appendiceal base, likely reactive. 3. Small volume ascites. 4. Mildly distended gallbladder with layering hyperattenuation, which may represent stones/sludge. 5.  Aortic Atherosclerosis (ICD10-I70.0). Electronically Signed   By: Agustin Cree M.D.   On: 07/27/2023 16:31    Procedures Procedures   Medications Ordered in ED Medications  piperacillin-tazobactam (ZOSYN) IVPB 3.375 g (3.375 g Intravenous New Bag/Given 07/27/23 1802)  lactated ringers  bolus 1,000 mL (0 mLs Intravenous Stopped 07/27/23 1733)  ondansetron (ZOFRAN) injection 4 mg (4 mg Intravenous Given 07/27/23 1536)    ED Course/ Medical Decision Making/ A&P                               Medical Decision Making Amount and/or Complexity of Data Reviewed Labs: ordered. Radiology: ordered.  Risk Prescription drug management. Decision regarding hospitalization.   This patient presents to the ED for concern of diarrhea.  Differential diagnosis includes gastroenteritis, dehydration, UTI, C. difficile, AKI   Lab Tests:  I Ordered, and personally interpreted labs.  The pertinent results include: CBC with leukocytosis of 18.5 and elevations in RBCs, hemoglobin and hematocrit likely due to hemoconcentration, CMP shows evidence of dehydration with sodium at 129, chloride 87, creatinine of 2.2, GFR 23, and anion gap acidosis at 18.  C. difficile quick screen and GI panel ordered but pending at this time.  Blood cultures also ordered prior to administration of antibiotics.   Imaging Studies ordered:  I ordered imaging studies including CT abdomen pelvis I independently visualized and interpreted imaging which showed pancolitis with concerns for possible C. difficile given some ascites in the abdomen I agree with the radiologist interpretation   Medicines ordered and prescription drug management:  I ordered medication including fluids, Zofran, Zosyn for dehydration, nausea, abdominal infection Reevaluation of the patient after these medicines showed that the patient improved I have reviewed the patients home medicines and have made adjustments as needed   Problem List / ED Course:  Patient presented to the emergency department concerns of diarrhea, nausea, dehydration.  Patient has past history significant for type 2 diabetes, hypertension, umbilical hernia.  She reports that she has been experiencing diarrhea for the last 5 days or so without significant improvement.  Has  tried taking Imodium which initially did not appear to improve her symptoms up to 4 doses Imodium yesterday and feels that her diarrhea is now slowing down.  Denies any medic easy, hematemesis, or melanotic stools.  Does report that she is having some worsening nausea type symptoms and feels that she is having a dry heaves but denies any significant vomiting.  Initially thought that her reaction was possibly due to her diabetic medication so she has stopped taking her diabetic medications.  This is likely cause for her hyperglycemia and is noted on the  CMP. CBC is concerning for significant leukocytosis at 18.5 but this may be secondary to hemoconcentration given that white blood cells are also elevated.  Urinalysis is not indicating any acute infection and CMP does have findings concerning for significant dehydration with GFR down to 23.  No recent CMP to compare GFR to call this an acute AKI although this does appear to be consistent with renal failure.  Unsure this could possibly be due to infectious colitis.  Stool labs ordered including C. difficile and GI panel. CT imaging is concerning for pancolitis with some ascites which can be seen in the setting of C. difficile this is included on differential.  No recent antibiotic use and primary risk factor is elderly age for C. difficile infection.  Will initiate treatment with Zosyn for what is presumed to be infectious diarrhea. Will consult hospitalist given significant dehydration with evidence of AKI/renal failure. Spoke with Dr. Randol Kern, hospitalist, who will be admitting patient.   Final Clinical Impression(s) / ED Diagnoses Final diagnoses:  AKI (acute kidney injury) (HCC)  Pancolitis [facility])    Rx / DC Orders ED Discharge Orders     None         Salomon Mast 07/27/23 1820    Rondel Baton, MD 07/28/23 [phone]

## 2023-07-27 NOTE — ED Triage Notes (Signed)
Pt reports:  Diarrhea Started last week Sinus infection Dry Heaving Decreased Appetite  Unable to keep fluids down

## 2023-07-28 ENCOUNTER — Inpatient Hospital Stay (HOSPITAL_COMMUNITY): Payer: Medicare Other

## 2023-07-28 DIAGNOSIS — I447 Left bundle-branch block, unspecified: Secondary | ICD-10-CM | POA: Diagnosis not present

## 2023-07-28 DIAGNOSIS — K529 Noninfective gastroenteritis and colitis, unspecified: Secondary | ICD-10-CM | POA: Diagnosis not present

## 2023-07-28 LAB — BASIC METABOLIC PANEL
Anion gap: 13 (ref 5–15)
BUN: 37 mg/dL — ABNORMAL HIGH (ref 8–23)
CO2: 22 mmol/L (ref 22–32)
Calcium: 8.4 mg/dL — ABNORMAL LOW (ref 8.9–10.3)
Chloride: 95 mmol/L — ABNORMAL LOW (ref 98–111)
Creatinine, Ser: 2.03 mg/dL — ABNORMAL HIGH (ref 0.44–1.00)
GFR, Estimated: 26 mL/min — ABNORMAL LOW (ref >60)
Glucose, Bld: 146 mg/dL — ABNORMAL HIGH (ref 70–99)
Potassium: 2.4 mmol/L — CL (ref 3.5–5.1)
Sodium: 130 mmol/L — ABNORMAL LOW (ref 135–145)

## 2023-07-28 LAB — GLUCOSE, CAPILLARY
Glucose-Capillary: 154 mg/dL — ABNORMAL HIGH (ref 70–99)
Glucose-Capillary: 130 mg/dL — ABNORMAL HIGH (ref 70–99)
Glucose-Capillary: 108 mg/dL — ABNORMAL HIGH (ref 70–99)
Glucose-Capillary: 128 mg/dL — ABNORMAL HIGH (ref 70–99)

## 2023-07-28 LAB — CBC
HCT: 41 % (ref 36.0–46.0)
Hemoglobin: 13.4 g/dL (ref 12.0–15.0)
MCH: 25.6 pg — ABNORMAL LOW (ref 26.0–34.0)
MCHC: 32.7 g/dL (ref 30.0–36.0)
MCV: 78.2 fL — ABNORMAL LOW (ref 80.0–100.0)
Platelets: 219 10*3/uL (ref 150–400)
RBC: 5.24 MIL/uL — ABNORMAL HIGH (ref 3.87–5.11)
RDW: 14.9 % (ref 11.5–15.5)
WBC: 11.6 10*3/uL — ABNORMAL HIGH (ref 4.0–10.5)
nRBC: 0 % (ref 0.0–0.2)

## 2023-07-28 LAB — HEMOGLOBIN A1C
Hgb A1c MFr Bld: 7.2 % — ABNORMAL HIGH (ref 4.8–5.6)
Mean Plasma Glucose: 159.94 mg/dL

## 2023-07-28 LAB — MAGNESIUM: Magnesium: 1.8 mg/dL (ref 1.7–2.4)

## 2023-07-28 MED ORDER — POTASSIUM CHLORIDE 20 MEQ PO PACK: 40.0000 meq | PACK | Freq: Once | ORAL | Status: DC

## 2023-07-28 MED ORDER — POTASSIUM CHLORIDE CRYS ER 20 MEQ PO TBCR
40.0000 meq | EXTENDED_RELEASE_TABLET | Freq: Three times a day (TID) | ORAL | Status: AC
  Administered 2023-07-28 – 2023-07-29 (×6): 40 meq via ORAL
  Filled 2023-07-28 (×6): qty 2

## 2023-07-28 NOTE — Progress Notes (Signed)
Unable to obtain stool sample from pt. Pt urinated in each sample while trying to have a bowel movement. Added a hat in the BSC/ commode to separate the 2 and sample still contaminated with urine.

## 2023-07-28 NOTE — Progress Notes (Signed)
PROGRESS NOTE    Tracy Bonilla  ZOX:[id number] DOB: 07-22-1951 DOA: 07/27/2023 PCP: Elfredia Nevins, MD    Brief Narrative:  72 year old with history of hypertension, type 2 diabetes on oral hypoglycemics, hyperlipidemia presented with about 6 days of nausea vomiting, frequent watery stool that is started after about 24 hours of Thanksgiving dinner last week.  No other family members were sick.  No sick contacts.  No recent use of antibiotics.  In the emergency room hemodynamically stable.  AKI with creatinine 2.2, white cell count 18.5, CT scan with pancolitis.  Admitted due to significant symptoms.  Subjective: Patient seen and examined.  Overnight she had 3 loose bowel movements mixed with the urine so sample could not be collected.  Denies any abdominal pain or cramping.  Still has occasional nausea but much better.  She is telling that her frequency of diarrhea is improving.  Stool is mostly brown color but very loose.  Afebrile.  Assessment & Plan:   Intractable nausea vomiting and diarrhea: Likely secondary to viral gastroenteritis.  Rule out bacterial enteritis. Stool pathogen panel, C. difficile pending. Continue IV fluids.  Encourage oral intake.  Keep on full liquid diet until nausea improves. Continue Rocephin and Flagyl until clinical improvement or any stool studies available. Electrolytes as below.  Acute kidney injury: Likely above.  Also with concomitant use of metformin and ACE inhibitor's.  Continue Isotonic fluid today.  Trending down.  Recheck tomorrow morning.  Hypokalemia: Severe and persistent.  Replace aggressively today.  Check magnesium phosphorus and potassium in the morning.  Hyponatremia: Hypochloremic hyponatremia secondary to dehydration.  Continue isotonic fluid.  Essential hypertension: Holding ACE and diuretics given AKI.  As needed medications.  Type 2 diabetes: Well-controlled as per patient.  She is on glipizide and metformin at home.  Holding  metformin.  On sliding scale today.   DVT prophylaxis: heparin injection 5,000 Units Start: 07/27/23 2200   Code Status: Full code Family Communication: None at the bedside Disposition Plan: Status is: Inpatient Remains inpatient appropriate because: IV fluids, electrolyte replacement and monitoring     Consultants:  None  Procedures:  None  Antimicrobials:  Rocephin and Flagyl 12/6---     Objective: Vitals:   07/27/23 2022 07/27/23 2210 07/28/23 0209 07/28/23 0625  BP: 109/70 109/60 123/65 116/60  Pulse: (!) 124 79 81 87  Resp: 19 19 19 19   Temp: 97.6 F (36.4 C) 97.7 F (36.5 C) 98.6 F (37 C) 97.6 F (36.4 C)  TempSrc: Oral Oral Oral Oral  SpO2:  94% 95% 95%  Weight:      Height:        Intake/Output Summary (Last 24 hours) at 07/28/2023 0828 Last data filed at 07/28/2023 0656 Gross per 24 hour  Intake 1450 ml  Output --  Net 1450 ml   Filed Weights   07/27/23 1130  Weight: 79.8 kg    Examination:  General exam: Appears calm and comfortable , occasionally uncomfortable with nausea. Respiratory system: Clear to auscultation. Respiratory effort normal. Cardiovascular system: S1 & S2 heard, RRR.  No pedal edema. Gastrointestinal system: Soft.  Nontender.  Bowel sound present. Central nervous system: Alert and oriented. No focal neurological deficits. Extremities: Symmetric 5 x 5 power. Skin: No rashes, lesions or ulcers Psychiatry: Judgement and insight appear normal. Mood & affect appropriate.     Data Reviewed: I have personally reviewed following labs and imaging studies  CBC: Recent Labs  Lab 07/27/23 1146 07/28/23 0500  WBC 18.5* 11.6*  HGB 15.3* 13.4  HCT 48.1* 41.0  MCV 80.3 78.2*  PLT 225 219   Basic Metabolic Panel: Recent Labs  Lab 07/27/23 1146 07/28/23 0500 07/28/23 0646  NA 129* 130*  --   K 3.5 2.4*  --   CL 87* 95*  --   CO2 24 22  --   GLUCOSE 214* 146*  --   BUN 30* 37*  --   CREATININE 2.20* 2.03*  --    CALCIUM 9.9 8.4*  --   MG  --   --  1.8   GFR: Estimated Creatinine Clearance: 25.4 mL/min (A) (by C-G formula based on SCr of 2.03 mg/dL (H)). Liver Function Tests: Recent Labs  Lab 07/27/23 1146  AST 38  ALT 29  ALKPHOS 66  BILITOT 0.7  PROT 7.0  ALBUMIN 3.3*   Recent Labs  Lab 07/27/23 1146  LIPASE 21   No results for input(s): "AMMONIA" in the last 168 hours. Coagulation Profile: No results for input(s): "INR", "PROTIME" in the last 168 hours. Cardiac Enzymes: No results for input(s): "CKTOTAL", "CKMB", "CKMBINDEX", "TROPONINI" in the last 168 hours. BNP (last 3 results) No results for input(s): "PROBNP" in the last 8760 hours. HbA1C: No results for input(s): "HGBA1C" in the last 72 hours. CBG: Recent Labs  Lab 07/27/23 2300  GLUCAP 159*   Lipid Profile: No results for input(s): "CHOL", "HDL", "LDLCALC", "TRIG", "CHOLHDL", "LDLDIRECT" in the last 72 hours. Thyroid Function Tests: No results for input(s): "TSH", "T4TOTAL", "FREET4", "T3FREE", "THYROIDAB" in the last 72 hours. Anemia Panel: No results for input(s): "VITAMINB12", "FOLATE", "FERRITIN", "TIBC", "IRON", "RETICCTPCT" in the last 72 hours. Sepsis Labs: Recent Labs  Lab 07/27/23 1740  PROCALCITON 1.35    Recent Results (from the past 240 hour(s))  Blood culture (routine x 2)     Status: None (Preliminary result)   Collection Time: 07/27/23  5:29 PM   Specimen: BLOOD  Result Value Ref Range Status   Specimen Description BLOOD LEFT ANTECUBITAL  Final   Special Requests   Final    Blood Culture adequate volume BOTTLES DRAWN AEROBIC AND ANAEROBIC   Culture   Final    NO GROWTH < 12 HOURS Performed at [facility], [address] [place], [place] [address]    Report Status PENDING  Incomplete  Blood culture (routine x 2)     Status: None (Preliminary result)   Collection Time: 07/27/23  5:40 PM   Specimen: BLOOD  Result Value Ref Range Status   Specimen Description BLOOD RIGHT ANTECUBITAL   Final   Special Requests   Final    Blood Culture adequate volume BOTTLES DRAWN AEROBIC AND ANAEROBIC   Culture   Final    NO GROWTH < 12 HOURS Performed at [facility], [address] [place], [place] [address]    Report Status PENDING  Incomplete         Radiology Studies: CT ABDOMEN PELVIS WO CONTRAST  Result Date: 07/27/2023 CLINICAL DATA:  One-week history of diarrhea and dry heaving with decreased appetite EXAM: CT ABDOMEN AND PELVIS WITHOUT CONTRAST TECHNIQUE: Multidetector CT imaging of the abdomen and pelvis was performed following the standard protocol without IV contrast. RADIATION DOSE REDUCTION: This exam was performed according to the departmental dose-optimization program which includes automated exposure control, adjustment of the mA and/or kV according to patient size and/or use of iterative reconstruction technique. COMPARISON:  None Available. FINDINGS: Decreased sensitivity and specificity for detailed findings due to motion artifact. Lower chest: No focal  consolidation or pulmonary nodule in the lung bases. No pleural effusion or pneumothorax demonstrated. Partially imaged heart size is normal. Hepatobiliary: No focal hepatic lesions. No intra or extrahepatic biliary ductal dilation. Mildly distended gallbladder with layering hyperattenuation, which may represent stones/sludge. Pancreas: No focal lesions or main ductal dilation. Spleen: Normal in size without focal abnormality. Adrenals/Urinary Tract: No adrenal nodules. No suspicious renal mass on this noncontrast enhanced examination, calculi or hydronephrosis. No focal bladder wall thickening. Stomach/Bowel: Normal appearance of the stomach. Diffusely patulous, featureless appearance of the colon with marked circumferential mural thickening. Normal appearance of the terminal ileum. Mild mural thickening of the appendiceal base. Vascular/Lymphatic: Aortic atherosclerosis. No enlarged abdominal or pelvic lymph nodes.  Reproductive: No adnexal masses. Other: Small volume ascites.  No free air or fluid collection. Musculoskeletal: No acute or abnormal lytic or blastic osseous lesions. Multilevel degenerative changes of the partially imaged thoracic and lumbar spine. Anterior wedging of L1. IMPRESSION: 1. Findings of pancolitis, which may be infectious or inflammatory in the acute setting. Given appearance of marked mural thickening and ascites, C diff colitis is within the differential. 2. Mild mural thickening of the appendiceal base, likely reactive. 3. Small volume ascites. 4. Mildly distended gallbladder with layering hyperattenuation, which may represent stones/sludge. 5.  Aortic Atherosclerosis (ICD10-I70.0). Electronically Signed   By: Agustin Cree M.D.   On: 07/27/2023 16:31        Scheduled Meds:  heparin  5,000 Units Subcutaneous Q8H   insulin aspart  0-5 Units Subcutaneous QHS   insulin aspart  0-9 Units Subcutaneous TID WC   metoprolol tartrate  25 mg Oral BID   potassium chloride  40 mEq Oral TID   pravastatin  20 mg Oral Daily   Continuous Infusions:  sodium chloride 75 mL/hr at 07/28/23 0608   cefTRIAXone (ROCEPHIN)  IV 2 g (07/28/23 0035)   metronidazole 500 mg (07/27/23 2310)     LOS: 1 day    Time spent: 40 minutes    Dorcas Carrow, MD [facility]

## 2023-07-28 NOTE — Progress Notes (Signed)
  Echocardiogram 2D Echocardiogram has been performed.  Milda Smart 07/28/2023, 3:54 PM

## 2023-07-28 NOTE — Progress Notes (Signed)
   07/28/23 1726  TOC Brief Assessment  Insurance and Status Reviewed  Patient has primary care physician Yes  Home environment has been reviewed From home  Prior level of function: Independent  Social Determinants of Health Reivew SDOH reviewed no interventions necessary  Readmission risk has been reviewed Yes  Transition of care needs no transition of care needs at this time    Transition of Care Department [facility]) has reviewed patient and no TOC needs have been identified at this time. We will continue to monitor patient advancement through interdisciplinary progression rounds. If new patient transition needs arise, please place a TOC consult.

## 2023-07-28 NOTE — Progress Notes (Addendum)
   07/28/23 0645  Provider Notification  Provider Name/Title J. MANSY  Date Provider Notified 07/28/23  Time Provider Notified 0645  Method of Notification Page  Notification Reason Critical Result (POTASSIUM 2.4)  Provider response See new orders  Date of Provider Response 07/28/23  Time of Provider Response [phone]   PT denies any complaints at this time. No changes on tele per [facility].

## 2023-07-29 DIAGNOSIS — K529 Noninfective gastroenteritis and colitis, unspecified: Secondary | ICD-10-CM | POA: Diagnosis not present

## 2023-07-29 LAB — BASIC METABOLIC PANEL
Anion gap: 15 (ref 5–15)
BUN: 34 mg/dL — ABNORMAL HIGH (ref 8–23)
CO2: 17 mmol/L — ABNORMAL LOW (ref 22–32)
Calcium: 8.4 mg/dL — ABNORMAL LOW (ref 8.9–10.3)
Chloride: 102 mmol/L (ref 98–111)
Creatinine, Ser: 1.37 mg/dL — ABNORMAL HIGH (ref 0.44–1.00)
GFR, Estimated: 41 mL/min — ABNORMAL LOW (ref >60)
Glucose, Bld: 128 mg/dL — ABNORMAL HIGH (ref 70–99)
Potassium: 3.5 mmol/L (ref 3.5–5.1)
Sodium: 134 mmol/L — ABNORMAL LOW (ref 135–145)

## 2023-07-29 LAB — ECHOCARDIOGRAM COMPLETE
Height: 63 in
S' Lateral: 3.1 cm
Weight: 2816 [oz_av]

## 2023-07-29 LAB — CBC WITH DIFFERENTIAL/PLATELET
Abs Immature Granulocytes: 0.17 10*3/uL — ABNORMAL HIGH (ref 0.00–0.07)
Basophils Absolute: 0.1 10*3/uL (ref 0.0–0.1)
Basophils Relative: 1 %
Eosinophils Absolute: 0.1 10*3/uL (ref 0.0–0.5)
Eosinophils Relative: 1 %
HCT: 44.5 % (ref 36.0–46.0)
Hemoglobin: 14.2 g/dL (ref 12.0–15.0)
Immature Granulocytes: 1 %
Lymphocytes Relative: 4 %
Lymphs Abs: 0.6 10*3/uL — ABNORMAL LOW (ref 0.7–4.0)
MCH: 25.3 pg — ABNORMAL LOW (ref 26.0–34.0)
MCHC: 31.9 g/dL (ref 30.0–36.0)
MCV: 79.2 fL — ABNORMAL LOW (ref 80.0–100.0)
Monocytes Absolute: 0.3 10*3/uL (ref 0.1–1.0)
Monocytes Relative: 2 %
Neutro Abs: 12.1 10*3/uL — ABNORMAL HIGH (ref 1.7–7.7)
Neutrophils Relative %: 91 %
Platelets: 257 10*3/uL (ref 150–400)
RBC: 5.62 MIL/uL — ABNORMAL HIGH (ref 3.87–5.11)
RDW: 15.3 % (ref 11.5–15.5)
WBC: 13.3 10*3/uL — ABNORMAL HIGH (ref 4.0–10.5)
nRBC: 0 % (ref 0.0–0.2)

## 2023-07-29 LAB — C DIFFICILE QUICK SCREEN W PCR REFLEX
C Diff antigen: POSITIVE — AB
C Diff interpretation: DETECTED
C Diff toxin: POSITIVE — AB

## 2023-07-29 LAB — GLUCOSE, CAPILLARY
Glucose-Capillary: 164 mg/dL — ABNORMAL HIGH (ref 70–99)
Glucose-Capillary: 184 mg/dL — ABNORMAL HIGH (ref 70–99)
Glucose-Capillary: 180 mg/dL — ABNORMAL HIGH (ref 70–99)
Glucose-Capillary: 147 mg/dL — ABNORMAL HIGH (ref 70–99)

## 2023-07-29 LAB — PHOSPHORUS: Phosphorus: 3.7 mg/dL (ref 2.5–4.6)

## 2023-07-29 LAB — MAGNESIUM: Magnesium: 2 mg/dL (ref 1.7–2.4)

## 2023-07-29 MED ORDER — VANCOMYCIN HCL 125 MG PO CAPS
125.0000 mg | ORAL_CAPSULE | Freq: Four times a day (QID) | ORAL | Status: DC
  Administered 2023-07-29 – 2023-08-06 (×33): 125 mg via ORAL
  Filled 2023-07-29 (×34): qty 1

## 2023-07-29 MED ORDER — RISAQUAD PO CAPS
2.0000 | ORAL_CAPSULE | Freq: Three times a day (TID) | ORAL | Status: DC
  Administered 2023-07-29 – 2023-08-06 (×25): 2 via ORAL
  Filled 2023-07-29 (×26): qty 2

## 2023-07-29 NOTE — Progress Notes (Signed)
Pt educated on personal and hand hygiene as she was seen wiping back to front with complaints of irritation to vagina. Still unable to get stool sample at this time as pt is still urinating at the same time as she produces stool.

## 2023-07-29 NOTE — Plan of Care (Signed)
  Problem: Acute Rehab PT Goals(only PT should resolve) Goal: Pt Will Go Supine/Side To Sit Outcome: Progressing Flowsheets (Taken 07/29/2023 1259) Pt will go Supine/Side to Sit:  with modified independence  Independently Goal: Patient Will Transfer Sit To/From Stand Outcome: Progressing Flowsheets (Taken 07/29/2023 1259) Patient will transfer sit to/from stand:  Independently  with modified independence Goal: Pt Will Transfer Bed To Chair/Chair To Bed Outcome: Progressing Flowsheets (Taken 07/29/2023 1259) Pt will Transfer Bed to Chair/Chair to Bed:  with modified independence  with supervision Goal: Pt Will Ambulate Outcome: Progressing Flowsheets (Taken 07/29/2023 1259) Pt will Ambulate:  50 feet  with modified independence  with supervision  with cane   1:00 PM, 07/29/23 Ocie Bob, MPT Physical Therapist with [facility] [phone] office [phone] mobile phone

## 2023-07-29 NOTE — Hospital Course (Signed)
Tracy Bonilla  is a 72 y.o. female, with past medical history of hypertension, diabetes mellitus type 2, umbilical hernia, hyperlipidemia, patient presents to ED secondary to complaints of diarrhea, nausea, dehydration and poor oral intake, patient reports has been having diarrhea for last 5 days, some abdominal cramping, she denies any sick contact, vomiting, but reports poor oral intake, denies any bright red blood per rectum, she denies any sick contacts, reports most recent antibiotics used was September for otitis and ear infection -In ED labs significant for sodium 129, glucose 214, creatinine 2.2 with BUN of 30, her white blood cell count elevated at 18.5, CT abdomen pelvis was obtained significant for pancolitis, [facility] consulted to admit.    Assessment & Plan:     Principal Problem:   Colitis Active Problems:   Essential hypertension   Type 2 diabetes mellitus (HCC)     C. difficile colitis Diarrhea/colitis -POA: Watery diarrhea x 7 days with abdominal pain, nausea poor appetite -Pancolitis, with concern for C. difficile, patient reports last antibiotic use was end of September -Started on IV Zosyn while in ED, >>> will switch to IV Rocephin and Flagyl>>>  -Switching to p.o. vancomycin  -Keep on full liquid diet advance as tolerated   AKI -Likely due to dehydration, monitoring closely -Continue IV fluid till oral intake is adequate -Avoid nephrotoxins Holding metformin, lisinopril and HCTZ    Hypertension -  holding home medication of HCTZ and lisinopril-due to dehydration, AKI -Continue metoprolol for now given her sinus tachycardia   Sinus tachycardia Left bundle branch block -Patient is tachycardic, secondary to volume depletion and dehydration, will continue with IV fluids -EKG with  left bundle branch block, appears to be new as compared to most recent EKG which is in 2018, so it is of unclear chronicity, she denies any chest pain or shortness of breath,  -  2D echo. -Patient was started on low-dose metoprolol    Diabetes mellitus - Hold her glimepiride and metformin for now, and will keep on insulin sliding scale   Weakness-deconditioning -Will consult PT    Hyponatremia -Due to volume depletion -Continue with IV fluids-

## 2023-07-29 NOTE — Evaluation (Signed)
Physical Therapy Evaluation Patient Details Name: Tracy Bonilla MRN: [id number] DOB: [birth year] Today's Date: 07/29/2023  History of Present Illness  Tracy Bonilla  is a 72 y.o. female, with past medical history of hypertension, diabetes mellitus type 2, umbilical hernia, hyperlipidemia, patient presents to ED secondary to complaints of diarrhea, nausea, dehydration and poor oral intake, patient reports has been having diarrhea for last 5 days, some abdominal cramping, she denies any sick contact, vomiting, but reports poor oral intake, denies any bright red blood per rectum, she denies any sick contacts, reports most recent antibiotics used was September for otitis and ear infection   Clinical Impression  Patient demonstrates fair/good return for transferring to/from commode in bathroom using side rail, ambulated in room without loss of balance with good return for using SPC, but limited mostly due to fatigue and requested to go back to bed due to fatigue.  Patient will benefit from continued skilled physical therapy in hospital and recommended venue below to increase strength, balance, endurance for safe ADLs and gait.         If plan is discharge home, recommend the following: A little help with walking and/or transfers;A little help with bathing/dressing/bathroom;Help with stairs or ramp for entrance;Assistance with cooking/housework   Can travel by private vehicle        Equipment Recommendations None recommended by PT  Recommendations for Other Services       Functional Status Assessment Patient has had a recent decline in their functional status and demonstrates the ability to make significant improvements in function in a reasonable and predictable amount of time.     Precautions / Restrictions Precautions Precautions: Fall Restrictions Weight Bearing Restrictions: No      Mobility  Bed Mobility Overal bed mobility: Needs Assistance Bed Mobility: Sit to Supine       Sit to  supine: Modified independent (Device/Increase time), Supervision   General bed mobility comments: slightly labored movement    Transfers Overall transfer level: Needs assistance Equipment used: Rolling walker (2 wheels) Transfers: Sit to/from Stand, Bed to chair/wheelchair/BSC Sit to Stand: Supervision, Contact guard assist   Step pivot transfers: Supervision, Contact guard assist       General transfer comment: increased time, labored movement    Ambulation/Gait Ambulation/Gait assistance: Supervision, Contact guard assist Gait Distance (Feet): 20 Feet Assistive device: Straight cane Gait Pattern/deviations: Step-to pattern, Decreased step length - right, Decreased step length - left, Decreased stride length Gait velocity: decreased     General Gait Details: slow labored cadence with moslty step-to pattern without loss of balance, limited mostly due to c/o fatigue  Stairs            Wheelchair Mobility     Tilt Bed    Modified Rankin (Stroke Patients Only)       Balance Overall balance assessment: Needs assistance Sitting-balance support: Feet supported, No upper extremity supported Sitting balance-Leahy Scale: Good Sitting balance - Comments: seated at EOB   Standing balance support: During functional activity, Single extremity supported Standing balance-Leahy Scale: Fair Standing balance comment: fair/good using SPC                             Pertinent Vitals/Pain Pain Assessment Pain Assessment: No/denies pain    Home Living Family/patient expects to be discharged to:: Private residence Living Arrangements: Spouse/significant other Available Help at Discharge: Family;Available 24 hours/day Type of Home: House Home Access: Stairs to enter Entrance Stairs-Rails: Right;Left;Can  reach both Entrance Stairs-Number of Steps: 3   Home Layout: One level Home Equipment: Rolling Walker (2 wheels);BSC/3in1;Cane - single point;Grab bars -  tub/shower;Shower seat      Prior Function Prior Level of Function : Independent/Modified Independent;Driving             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, drives ADLs Comments: Independent     Extremity/Trunk Assessment   Upper Extremity Assessment Upper Extremity Assessment: Overall WFL for tasks assessed    Lower Extremity Assessment Lower Extremity Assessment: Generalized weakness    Cervical / Trunk Assessment Cervical / Trunk Assessment: Kyphotic  Communication   Communication Communication: No apparent difficulties  Cognition Arousal: Alert Behavior During Therapy: WFL for tasks assessed/performed Overall Cognitive Status: Within Functional Limits for tasks assessed                                          General Comments      Exercises     Assessment/Plan    PT Assessment Patient needs continued PT services  PT Problem List Decreased strength;Decreased activity tolerance;Decreased balance;Decreased mobility       PT Treatment Interventions DME instruction;Gait training;Stair training;Functional mobility training;Therapeutic activities;Therapeutic exercise;Balance training;Patient/family education    PT Goals (Current goals can be found in the Care Plan section)  Acute Rehab PT Goals Patient Stated Goal: return home with family to assist PT Goal Formulation: With patient/family Time For Goal Achievement: 08/03/23 Potential to Achieve Goals: Good    Frequency Min 3X/week     Co-evaluation               AM-PAC PT "6 Clicks" Mobility  Outcome Measure Help needed turning from your back to your side while in a flat bed without using bedrails?: None Help needed moving from lying on your back to sitting on the side of a flat bed without using bedrails?: A Little Help needed moving to and from a bed to a chair (including a wheelchair)?: A Little Help needed standing up from a chair using your arms (e.g.,  wheelchair or bedside chair)?: A Little Help needed to walk in hospital room?: A Little Help needed climbing 3-5 steps with a railing? : A Little 6 Click Score: 19    End of Session   Activity Tolerance: Patient tolerated treatment well;Patient limited by fatigue Patient left: in bed;with call bell/phone within reach;with family/visitor present Nurse Communication: Mobility status PT Visit Diagnosis: Unsteadiness on feet (R26.81);Other abnormalities of gait and mobility (R26.89);Muscle weakness (generalized) (M62.81)    Time: 9323-5573 PT Time Calculation (min) (ACUTE ONLY): 31 min   Charges:   PT Evaluation $PT Eval Moderate Complexity: 1 Mod PT Treatments $Therapeutic Activity: 23-37 mins PT General Charges $$ ACUTE PT VISIT: 1 Visit         12:58 PM, 07/29/23 Ocie Bob, MPT Physical Therapist with [facility] [phone] office [phone] mobile phone

## 2023-07-29 NOTE — Progress Notes (Signed)
PROGRESS NOTE    Patient: Tracy Bonilla                            PCP: Elfredia Nevins, MD                    DOB: 09/13/1950            DOA: 07/27/2023 ZOX:[id number]             DOS: 07/29/2023, 1:52 PM   LOS: 2 days   Date of Service: The patient was seen and examined on 07/29/2023  Subjective:   The patient was seen and examined this morning. Hemodynamically stable. No issues overnight .  Brief Narrative:   Tracy Bonilla  is a 72 y.o. female, with past medical history of hypertension, diabetes mellitus type 2, umbilical hernia, hyperlipidemia, patient presents to ED secondary to complaints of diarrhea, nausea, dehydration and poor oral intake, patient reports has been having diarrhea for last 5 days, some abdominal cramping, she denies any sick contact, vomiting, but reports poor oral intake, denies any bright red blood per rectum, she denies any sick contacts, reports most recent antibiotics used was September for otitis and ear infection -In ED labs significant for sodium 129, glucose 214, creatinine 2.2 with BUN of 30, her white blood cell count elevated at 18.5, CT abdomen pelvis was obtained significant for pancolitis, [facility] consulted to admit.    Assessment & Plan:     Principal Problem:   Colitis Active Problems:   Essential hypertension   Type 2 diabetes mellitus (HCC)     C. difficile colitis Diarrhea/colitis -POA: Watery diarrhea x 7 days with abdominal pain, nausea poor appetite -Pancolitis, with concern for C. difficile, patient reports last antibiotic use was end of September -Started on IV Zosyn while in ED, >>> will switch to IV Rocephin and Flagyl>>>  -Switching to p.o. vancomycin  -Keep on full liquid diet advance as tolerated   AKI -Likely due to dehydration, monitoring closely -Continue IV fluid till oral intake is adequate -Avoid nephrotoxins Holding metformin, lisinopril and HCTZ    Hypertension -  holding home medication of HCTZ and  lisinopril-due to dehydration, AKI -Continue metoprolol for now given her sinus tachycardia   Sinus tachycardia Left bundle branch block -Patient is tachycardic, secondary to volume depletion and dehydration, will continue with IV fluids -EKG with  left bundle branch block, appears to be new as compared to most recent EKG which is in 2018, so it is of unclear chronicity, she denies any chest pain or shortness of breath,  - 2D echo. -Patient was started on low-dose metoprolol    Diabetes mellitus - Hold her glimepiride and metformin for now, and will keep on insulin sliding scale   Weakness-deconditioning -Will consult PT    Hyponatremia -Due to volume depletion -Continue with IV fluids-    ----------------------------------------------------------------------------------------------------------------------------------------------- Nutritional status:  The patient's BMI is: Body mass index is 31.18 kg/m. I agree with the assessment and plan as outlined --------------------------------------------------------------------------------------------------------------------------------------------- Cultures; -Stool for C. difficile colitis positive Blood Cultures x 2 >>     ------------------------------------------------------------------------------------------------------------------------------------------------  DVT prophylaxis:  heparin injection 5,000 Units Start: 07/27/23 2200   Code Status:   Code Status: Full Code  Family Communication: No family member present at bedside-  -Advance care planning has been discussed.   Admission status:   Status is: Inpatient Remains inpatient appropriate because: Needing IV fluids IV antibiotics  Disposition: From  - home             Planning for discharge in 2 days: to   Procedures:   No admission procedures for hospital encounter.   Antimicrobials:  Anti-infectives (From admission, onward)    Start     Dose/Rate Route  Frequency Ordered Stop   07/29/23 1445  vancomycin (VANCOCIN) capsule 125 mg        125 mg Oral 4 times daily 07/29/23 1346 08/08/23 1359   07/28/23 0000  metroNIDAZOLE (FLAGYL) IVPB 500 mg  Status:  Discontinued        500 mg 100 mL/hr over 60 Minutes Intravenous Every 12 hours 07/27/23 1845 07/29/23 1350   07/28/23 0000  cefTRIAXone (ROCEPHIN) 2 g in sodium chloride 0.9 % 100 mL IVPB  Status:  Discontinued        2 g 200 mL/hr over 30 Minutes Intravenous Every 24 hours 07/27/23 1849 07/29/23 1345   07/27/23 1715  piperacillin-tazobactam (ZOSYN) IVPB 3.375 g        3.375 g 100 mL/hr over 30 Minutes Intravenous  Once 07/27/23 1700 07/27/23 1832        Medication:   acidophilus  2 capsule Oral TID   heparin  5,000 Units Subcutaneous Q8H   insulin aspart  0-5 Units Subcutaneous QHS   insulin aspart  0-9 Units Subcutaneous TID WC   metoprolol tartrate  25 mg Oral BID   potassium chloride  40 mEq Oral TID   pravastatin  20 mg Oral Daily   vancomycin  125 mg Oral QID    hydrALAZINE, ondansetron (ZOFRAN) IV   Objective:   Vitals:   07/28/23 0209 07/28/23 0625 07/28/23 2100 07/29/23 0351  BP: 123/65 116/60 (!) 147/85 111/71  Pulse: 81 87 (!) 107 (!) 110  Resp: 19 19 19 18   Temp: 98.6 F (37 C) 97.6 F (36.4 C) 97.8 F (36.6 C) 98 F (36.7 C)  TempSrc: Oral Oral Oral Oral  SpO2: 95% 95%  95%  Weight:      Height:        Intake/Output Summary (Last 24 hours) at 07/29/2023 1352 Last data filed at 07/28/2023 1700 Gross per 24 hour  Intake 1302.21 ml  Output --  Net 1302.21 ml   Filed Weights   07/27/23 1130  Weight: 79.8 kg     Physical examination:   Constitution:  Alert, cooperative, no distress,  Appears calm and comfortable  Psychiatric:   Normal and stable mood and affect, cognition intact,   HEENT:        Normocephalic, PERRL, otherwise with in Normal limits  Chest:         Chest symmetric Cardio vascular:  S1/S2, RRR, No murmure, No Rubs or Gallops   pulmonary: Clear to auscultation bilaterally, respirations unlabored, negative wheezes / crackles Abdomen: Soft, non-tender, non-distended, bowel sounds,no masses, no organomegaly Muscular skeletal: Limited exam - in bed, able to move all 4 extremities,   Neuro: CNII-XII intact. , normal motor and sensation, reflexes intact  Extremities: No pitting edema lower extremities, +2 pulses  Skin: Dry, warm to touch, negative for any Rashes, No open wounds Wounds: per nursing documentation   ------------------------------------------------------------------------------------------------------------------------------------------    LABs:     Latest Ref Rng & Units 07/29/2023    4:06 AM 07/28/2023    5:00 AM 07/27/2023   11:46 AM  CBC  WBC 4.0 - 10.5 K/uL 13.3  11.6  18.5   Hemoglobin 12.0 - 15.0 g/dL 14.2  13.4  15.3   Hematocrit 36.0 - 46.0 % 44.5  41.0  48.1   Platelets 150 - 400 K/uL 257  219  225       Latest Ref Rng & Units 07/29/2023    4:06 AM 07/28/2023    5:00 AM 07/27/2023   11:46 AM  CMP  Glucose 70 - 99 mg/dL 865  784  696   BUN 8 - 23 mg/dL 34  37  30   Creatinine 0.44 - 1.00 mg/dL 2.95  2.84  1.32   Sodium 135 - 145 mmol/L 134  130  129   Potassium 3.5 - 5.1 mmol/L 3.5  2.4  3.5   Chloride 98 - 111 mmol/L 102  95  87   CO2 22 - 32 mmol/L 17  22  24    Calcium 8.9 - 10.3 mg/dL 8.4  8.4  9.9   Total Protein 6.5 - 8.1 g/dL   7.0   Total Bilirubin <1.2 mg/dL   0.7   Alkaline Phos 38 - 126 U/L   66   AST 15 - 41 U/L   38   ALT 0 - 44 U/L   29        Micro Results Recent Results (from the past 240 hour(s))  C Difficile Quick Screen w PCR reflex     Status: Abnormal   Collection Time: 07/27/23  2:35 PM   Specimen: STOOL  Result Value Ref Range Status   C Diff antigen POSITIVE (A) NEGATIVE Final   C Diff toxin POSITIVE (A) NEGATIVE Final   C Diff interpretation Toxin producing C. difficile detected.  Final    Comment: CRITICAL RESULT CALLED TO, READ BACK BY AND  VERIFIED WITH: K. WATTS ON 07/29/2023 @12 :47 BY T.HAMER Performed at [facility], [address], [place], [place] [address]   Blood culture (routine x 2)     Status: None (Preliminary result)   Collection Time: 07/27/23  5:29 PM   Specimen: BLOOD  Result Value Ref Range Status   Specimen Description BLOOD LEFT ANTECUBITAL  Final   Special Requests   Final    Blood Culture adequate volume BOTTLES DRAWN AEROBIC AND ANAEROBIC   Culture   Final    NO GROWTH 2 DAYS Performed at [facility], [address], [place], [place] [address]    Report Status PENDING  Incomplete  Blood culture (routine x 2)     Status: None (Preliminary result)   Collection Time: 07/27/23  5:40 PM   Specimen: BLOOD  Result Value Ref Range Status   Specimen Description BLOOD RIGHT ANTECUBITAL  Final   Special Requests   Final    Blood Culture adequate volume BOTTLES DRAWN AEROBIC AND ANAEROBIC   Culture   Final    NO GROWTH 2 DAYS Performed at [facility], [address] [name], [place] [address]    Report Status PENDING  Incomplete    Radiology Reports ECHOCARDIOGRAM COMPLETE  Result Date: 07/29/2023    ECHOCARDIOGRAM REPORT   Patient Name:   CELSEA HELTZEL Date of Exam: 07/28/2023 Medical Rec #:  [id number]    Height:       63.0 in Accession #:    [id number]   Weight:       176.0 lb Date of Birth:  November 18, 1950     BSA:          1.831 m Patient Age:    72 years     BP:  116/60 mmHg Patient Gender: F            HR:           84 bpm. [facility] Procedure: 2D Echo, Cardiac Doppler and Color Doppler Indications:    LBBB  History:        Patient has no prior history of Echocardiogram examinations.                 Risk Factors:Hypertension, Dyslipidemia and Diabetes.  Sonographer:    Milda Smart Referring Phys: [id number] DAWO[address] ELGERGAWY IMPRESSIONS  1. Abnormal septal motion ? from LBBB. Left ventricular ejection fraction, by estimation, is 40 to 45%. The left ventricle has mildly decreased  function. The left ventricle demonstrates global hypokinesis. The left ventricular internal cavity size was mildly dilated. There is mild left ventricular hypertrophy. Left ventricular diastolic parameters are consistent with Grade I diastolic dysfunction (impaired relaxation).  2. Right ventricular systolic function is normal. The right ventricular size is normal.  3. Left atrial size was mildly dilated.  4. The mitral valve is normal in structure. No evidence of mitral valve regurgitation. No evidence of mitral stenosis.  5. The aortic valve is tricuspid. Aortic valve regurgitation is not visualized. No aortic stenosis is present.  6. The inferior vena cava is normal in size with greater than 50% respiratory variability, suggesting right atrial pressure of 3 mmHg. FINDINGS  Left Ventricle: Abnormal septal motion ? from LBBB. Left ventricular ejection fraction, by estimation, is 40 to 45%. The left ventricle has mildly decreased function. The left ventricle demonstrates global hypokinesis. The left ventricular internal cavity size was mildly dilated. There is mild left ventricular hypertrophy. Left ventricular diastolic parameters are consistent with Grade I diastolic dysfunction (impaired relaxation). Right Ventricle: The right ventricular size is normal. No increase in right ventricular wall thickness. Right ventricular systolic function is normal. Left Atrium: Left atrial size was mildly dilated. Right Atrium: Right atrial size was normal in size. Pericardium: There is no evidence of pericardial effusion. Mitral Valve: The mitral valve is normal in structure. No evidence of mitral valve regurgitation. No evidence of mitral valve stenosis. Tricuspid Valve: The tricuspid valve is normal in structure. Tricuspid valve regurgitation is not demonstrated. No evidence of tricuspid stenosis. Aortic Valve: The aortic valve is tricuspid. Aortic valve regurgitation is not visualized. No aortic stenosis is present. Pulmonic  Valve: The pulmonic valve was normal in structure. Pulmonic valve regurgitation is not visualized. No evidence of pulmonic stenosis. Aorta: The aortic root is normal in size and structure. Venous: The inferior vena cava is normal in size with greater than 50% respiratory variability, suggesting right atrial pressure of 3 mmHg. IAS/Shunts: No atrial level shunt detected by color flow Doppler.  LEFT VENTRICLE PLAX 2D LVIDd:         4.00 cm   Diastology LVIDs:         3.10 cm   LV e' medial:  6.64 cm/s LV PW:         1.20 cm   LV e' lateral: 4.35 cm/s LV IVS:        1.30 cm LVOT diam:     2.20 cm LV SV:         69 LV SV Index:   38 LVOT Area:     3.80 cm  RIGHT VENTRICLE             IVC RV S prime:     10.20 cm/s  IVC diam:  1.20 cm TAPSE (M-mode): 1.3 cm LEFT ATRIUM             Index        RIGHT ATRIUM           Index LA diam:        3.90 cm 2.13 cm/m   RA Area:     11.30 cm LA Vol (A2C):   27.8 ml 15.18 ml/m  RA Volume:   21.80 ml  11.90 ml/m LA Vol (A4C):   31.7 ml 17.31 ml/m LA Biplane Vol: 29.7 ml 16.22 ml/m  AORTIC VALVE LVOT Vmax:   118.00 cm/s LVOT Vmean:  77.300 cm/s LVOT VTI:    0.182 m  AORTA Ao Root diam: 3.20 cm Ao Asc diam:  3.60 cm  SHUNTS Systemic VTI:  0.18 m Systemic Diam: 2.20 cm Charlton Haws MD Electronically signed by Charlton Haws MD Signature Date/Time: 07/29/2023/7:57:28 AM    Final     SIGNED: Kendell Bane, MD, FHM. FAAFP. [facility] - [facility] Time spent - 55 min.  In seeing, evaluating and examining the patient. Reviewing medical records, labs, drawn plan of care. [facility],  Pager (please use [email or web address] to page/ text) Please use Epic Secure Chat for non-urgent communication (7AM-7PM)  If 7PM-7AM, please contact night-coverage [email or web address], 07/29/2023, 1:52 PM

## 2023-07-30 ENCOUNTER — Other Ambulatory Visit (HOSPITAL_COMMUNITY): Payer: Self-pay

## 2023-07-30 ENCOUNTER — Telehealth (HOSPITAL_COMMUNITY): Payer: Self-pay | Admitting: Pharmacy Technician

## 2023-07-30 DIAGNOSIS — K529 Noninfective gastroenteritis and colitis, unspecified: Secondary | ICD-10-CM | POA: Diagnosis not present

## 2023-07-30 LAB — BASIC METABOLIC PANEL
Anion gap: 13 (ref 5–15)
BUN: 33 mg/dL — ABNORMAL HIGH (ref 8–23)
CO2: 18 mmol/L — ABNORMAL LOW (ref 22–32)
Calcium: 8.9 mg/dL (ref 8.9–10.3)
Chloride: 101 mmol/L (ref 98–111)
Creatinine, Ser: 1.07 mg/dL — ABNORMAL HIGH (ref 0.44–1.00)
GFR, Estimated: 56 mL/min — ABNORMAL LOW (ref >60)
Glucose, Bld: 164 mg/dL — ABNORMAL HIGH (ref 70–99)
Potassium: 4 mmol/L (ref 3.5–5.1)
Sodium: 132 mmol/L — ABNORMAL LOW (ref 135–145)

## 2023-07-30 LAB — CBC
HCT: 47.8 % — ABNORMAL HIGH (ref 36.0–46.0)
Hemoglobin: 15.1 g/dL — ABNORMAL HIGH (ref 12.0–15.0)
MCH: 24.9 pg — ABNORMAL LOW (ref 26.0–34.0)
MCHC: 31.6 g/dL (ref 30.0–36.0)
MCV: 78.7 fL — ABNORMAL LOW (ref 80.0–100.0)
Platelets: 314 10*3/uL (ref 150–400)
RBC: 6.07 MIL/uL — ABNORMAL HIGH (ref 3.87–5.11)
RDW: 15.9 % — ABNORMAL HIGH (ref 11.5–15.5)
WBC: 16.6 10*3/uL — ABNORMAL HIGH (ref 4.0–10.5)
nRBC: 0 % (ref 0.0–0.2)

## 2023-07-30 LAB — GLUCOSE, CAPILLARY
Glucose-Capillary: 172 mg/dL — ABNORMAL HIGH (ref 70–99)
Glucose-Capillary: 184 mg/dL — ABNORMAL HIGH (ref 70–99)
Glucose-Capillary: 217 mg/dL — ABNORMAL HIGH (ref 70–99)
Glucose-Capillary: 218 mg/dL — ABNORMAL HIGH (ref 70–99)

## 2023-07-30 MED ORDER — GABAPENTIN 300 MG PO CAPS
300.0000 mg | ORAL_CAPSULE | Freq: Every day | ORAL | Status: DC
  Administered 2023-07-30 – 2023-08-05 (×7): 300 mg via ORAL
  Filled 2023-07-30 (×7): qty 1

## 2023-07-30 MED ORDER — GABAPENTIN 100 MG PO CAPS
100.0000 mg | ORAL_CAPSULE | Freq: Two times a day (BID) | ORAL | Status: DC
  Administered 2023-07-30 – 2023-08-06 (×14): 100 mg via ORAL
  Filled 2023-07-30 (×14): qty 1

## 2023-07-30 MED ORDER — CALCIUM CARBONATE 1250 (500 CA) MG PO TABS
1.0000 | ORAL_TABLET | Freq: Every day | ORAL | Status: DC
Start: 2023-07-31 — End: 2023-08-06
  Administered 2023-07-31 – 2023-08-06 (×7): 1250 mg via ORAL
  Filled 2023-07-30 (×7): qty 1

## 2023-07-30 MED ORDER — METRONIDAZOLE 500 MG/100ML IV SOLN
500.0000 mg | Freq: Three times a day (TID) | INTRAVENOUS | Status: DC
Start: 2023-07-30 — End: 2023-08-06
  Administered 2023-07-30 – 2023-08-06 (×22): 500 mg via INTRAVENOUS
  Filled 2023-07-30 (×22): qty 100

## 2023-07-30 MED ORDER — CALCIUM CITRATE 950 (200 CA) MG PO TABS: 200.0000 mg | ORAL_TABLET | Freq: Every day | ORAL | Status: DC

## 2023-07-30 NOTE — Progress Notes (Addendum)
PROGRESS NOTE    Patient: Tracy Bonilla                            PCP: Elfredia Nevins, MD                    DOB: 05-20-1951            DOA: 07/27/2023 ZOX:[id number]             DOS: 07/30/2023, 11:08 AM   LOS: 3 days   Date of Service: The patient was seen and examined on 07/30/2023  Subjective:   The patient was seen and examined this morning, stable no acute distress, mildly tachycardic heart rate 102, blood pressure 107/77, Still reporting of watery diarrhea, > 6 episode past 12 hours  Brief Narrative:   Tracy Bonilla  is a 72 y.o. female, with past medical history of hypertension, diabetes mellitus type 2, umbilical hernia, hyperlipidemia, patient presents to ED secondary to complaints of diarrhea, nausea, dehydration and poor oral intake, patient reports has been having diarrhea for last 5 days, some abdominal cramping, she denies any sick contact, vomiting, but reports poor oral intake, denies any bright red blood per rectum, she denies any sick contacts, reports most recent antibiotics used was September for otitis and ear infection -In ED labs significant for sodium 129, glucose 214, creatinine 2.2 with BUN of 30, her white blood cell count elevated at 18.5, CT abdomen pelvis was obtained significant for pancolitis, [facility] hospitalist consulted to admit.    Assessment & Plan:     Principal Problem: C. difficile colitis Active Problems:   Essential hypertension   Type 2 diabetes mellitus (HCC)     C. difficile colitis Diarrhea/colitis -Continue to have watery diarrhea > 6 episode in past 12 hours Nonbloody Encouraging p.o. intake  -POA: Watery diarrhea with abdominal pain, poor appetite -Pancolitis, with concern for C. difficile, patient reports last antibiotic use was end of September -Started on IV Zosyn while in ED, >>> IV Rocephin and Flagyl>>> discontinue -Switching to p.o. vancomycin  -Due to persistent watery diarrhea, leukocytosis we will add IV Flagyl -If no  improvement in oral intake we will initiate IV fluid again    AKI -Likely due to dehydration, monitoring closely -Improved with IV fluids  Lab Results  Component Value Date   CREATININE 1.07 (H) 07/30/2023   CREATININE 1.37 (H) 07/29/2023   CREATININE 2.03 (H) 07/28/2023    -Avoid nephrotoxins Holding metformin, lisinopril and HCTZ    Hypertension -  holding home medication of HCTZ and lisinopril-due to dehydration, AKI -Continue metoprolol for now given her sinus tachycardia   Sinus tachycardia Left bundle branch block -Still mildly tacky at rate 102 -EKG with  left bundle branch block, appears to be new as compared to most recent EKG which is in 2018, so it is of unclear chronicity, she denies any chest pain or shortness of breath,  - 2D echo:  -Patient was started on low-dose metoprolol    Diabetes mellitus - Hold her glimepiride and metformin for now, and will keep on insulin sliding scale   Weakness-deconditioning -Will consult PT    Hyponatremia -Improving -Due to volume depletion -Continue with IV fluids-    ----------------------------------------------------------------------------------------------------------------------------------------------- Nutritional status:  The patient's BMI is: Body mass index is 31.18 kg/m. I agree with the assessment and plan as outlined --------------------------------------------------------------------------------------------------------------------------------------------- Cultures; -Stool for C. difficile colitis positive Blood Cultures x 2 >>     ------------------------------------------------------------------------------------------------------------------------------------------------  DVT prophylaxis:  heparin injection 5,000 Units Start: 07/27/23 2200   Code Status:   Code Status: Full Code  Family Communication: No family member present at bedside-  -Advance care planning has been discussed.    Admission status:   Status is: Inpatient Remains inpatient appropriate because: Needing IV fluids IV antibiotics   Disposition: From  - home             Planning for discharge in 2 days: to   Procedures:   No admission procedures for hospital encounter.   Antimicrobials:  Anti-infectives (From admission, onward)    Start     Dose/Rate Route Frequency Ordered Stop   07/29/23 1445  vancomycin (VANCOCIN) capsule 125 mg        125 mg Oral 4 times daily 07/29/23 1346 08/08/23 1359   07/28/23 0000  metroNIDAZOLE (FLAGYL) IVPB 500 mg  Status:  Discontinued        500 mg 100 mL/hr over 60 Minutes Intravenous Every 12 hours 07/27/23 1845 07/29/23 1350   07/28/23 0000  cefTRIAXone (ROCEPHIN) 2 g in sodium chloride 0.9 % 100 mL IVPB  Status:  Discontinued        2 g 200 mL/hr over 30 Minutes Intravenous Every 24 hours 07/27/23 1849 07/29/23 1345   07/27/23 1715  piperacillin-tazobactam (ZOSYN) IVPB 3.375 g        3.375 g 100 mL/hr over 30 Minutes Intravenous  Once 07/27/23 1700 07/27/23 1832        Medication:   acidophilus  2 capsule Oral TID   heparin  5,000 Units Subcutaneous Q8H   insulin aspart  0-5 Units Subcutaneous QHS   insulin aspart  0-9 Units Subcutaneous TID WC   metoprolol tartrate  25 mg Oral BID   pravastatin  20 mg Oral Daily   vancomycin  125 mg Oral QID    hydrALAZINE, ondansetron (ZOFRAN) IV   Objective:   Vitals:   07/28/23 2100 07/29/23 0351 07/29/23 2008 07/30/23 1016  BP: (!) 147/85 111/71 131/75 107/77  Pulse: (!) 107 (!) 110 89 (!) 102  Resp: 19 18 17    Temp: 97.8 F (36.6 C) 98 F (36.7 C) 97.7 F (36.5 C)   TempSrc: Oral Oral Oral   SpO2:  95% 94%   Weight:      Height:        Intake/Output Summary (Last 24 hours) at 07/30/2023 1108 Last data filed at 07/30/2023 1022 Gross per 24 hour  Intake 720 ml  Output --  Net 720 ml   Filed Weights   07/27/23 1130  Weight: 79.8 kg     Physical examination:   General:  AAO x 3,   cooperative, no distress;   HEENT:  Normocephalic, PERRL, otherwise with in Normal limits   Neuro:  CNII-XII intact. , normal motor and sensation, reflexes intact   Lungs:   Clear to auscultation BL, Respirations unlabored,  No wheezes / crackles  Cardio:    S1/S2, RRR, No murmure, No Rubs or Gallops   Abdomen:  Soft, non-tender, bowel sounds active all four quadrants, no guarding or peritoneal signs.  Muscular  skeletal:  Limited exam -global generalized weaknesses - in bed, able to move all 4 extremities,   2+ pulses,  symmetric, No pitting edema  Skin:  Dry, warm to touch, negative for any Rashes,  Wounds: Please see nursing documentation         ------------------------------------------------------------------------------------------------------------------------------------------    LABs:     Latest Ref Rng &  Units 07/30/2023    4:23 AM 07/29/2023    4:06 AM 07/28/2023    5:00 AM  CBC  WBC 4.0 - 10.5 K/uL 16.6  13.3  11.6   Hemoglobin 12.0 - 15.0 g/dL 60.6  30.1  60.1   Hematocrit 36.0 - 46.0 % 47.8  44.5  41.0   Platelets 150 - 400 K/uL 314  257  219       Latest Ref Rng & Units 07/30/2023    4:23 AM 07/29/2023    4:06 AM 07/28/2023    5:00 AM  CMP  Glucose 70 - 99 mg/dL 093  235  573   BUN 8 - 23 mg/dL 33  34  37   Creatinine 0.44 - 1.00 mg/dL 2.20  2.54  2.70   Sodium 135 - 145 mmol/L 132  134  130   Potassium 3.5 - 5.1 mmol/L 4.0  3.5  2.4   Chloride 98 - 111 mmol/L 101  102  95   CO2 22 - 32 mmol/L 18  17  22    Calcium 8.9 - 10.3 mg/dL 8.9  8.4  8.4        Micro Results Recent Results (from the past 240 hour(s))  C Difficile Quick Screen w PCR reflex     Status: Abnormal   Collection Time: 07/27/23  2:35 PM   Specimen: STOOL  Result Value Ref Range Status   C Diff antigen POSITIVE (A) NEGATIVE Final   C Diff toxin POSITIVE (A) NEGATIVE Final   C Diff interpretation Toxin producing C. difficile detected.  Final    Comment: CRITICAL RESULT CALLED TO,  READ BACK BY AND VERIFIED WITH: K. WATTS ON 07/29/2023 @12 :47 BY T.HAMER Performed at [facility], [address], [place], [place] [address]   Blood culture (routine x 2)     Status: None (Preliminary result)   Collection Time: 07/27/23  5:29 PM   Specimen: BLOOD  Result Value Ref Range Status   Specimen Description BLOOD LEFT ANTECUBITAL  Final   Special Requests   Final    Blood Culture adequate volume BOTTLES DRAWN AEROBIC AND ANAEROBIC   Culture   Final    NO GROWTH 3 DAYS Performed at [facility], [address] [place], [place] [address]    Report Status PENDING  Incomplete  Blood culture (routine x 2)     Status: None (Preliminary result)   Collection Time: 07/27/23  5:40 PM   Specimen: BLOOD  Result Value Ref Range Status   Specimen Description BLOOD RIGHT ANTECUBITAL  Final   Special Requests   Final    Blood Culture adequate volume BOTTLES DRAWN AEROBIC AND ANAEROBIC   Culture   Final    NO GROWTH 3 DAYS Performed at [facility], [address], [place], [place] [address]    Report Status PENDING  Incomplete    Radiology Reports No results found.  SIGNED: Kendell Bane, MD, FHM. FAAFP. [facility] - [facility] Time spent - 55 min.  In seeing, evaluating and examining the patient. Reviewing medical records, labs, drawn plan of care. [facility],  Pager (please use [email or web address] to page/ text) Please use Epic Secure Chat for non-urgent communication (7AM-7PM)  If 7PM-7AM, please contact night-coverage [email or web address], 07/30/2023, 11:08 AM

## 2023-07-30 NOTE — Progress Notes (Signed)
Mobility Specialist Progress Note:    07/30/23 1211  Mobility  Activity  (heel slides and leg raises)  Level of Assistance Independent  Assistive Device None  Range of Motion/Exercises Active;Left arm;Left leg  Activity Response Tolerated well  Mobility Referral Yes  Mobility visit 1 Mobility  Mobility Specialist Start Time (ACUTE ONLY) 1145  Mobility Specialist Stop Time (ACUTE ONLY) 1200  Mobility Specialist Time Calculation (min) (ACUTE ONLY) 15 min   Pt received in bed, husband in room. Pt not agreeable to ambulation d/t diarrhea and abdominal pain. Independently able to do heel slides and leg raises. Tolerated well, asx throughout. Left pt supine, alarm on. All needs met.   Lawerance Bach Mobility Specialist Please contact via Special educational needs teacher or  Rehab office at [phone]

## 2023-07-30 NOTE — Plan of Care (Signed)
  Problem: Health Behavior/Discharge Planning: Goal: Ability to manage health-related needs will improve Outcome: Progressing   Problem: Clinical Measurements: Goal: Ability to maintain clinical measurements within normal limits will improve Outcome: Progressing Goal: Cardiovascular complication will be avoided Outcome: Progressing   Problem: Activity: Goal: Risk for activity intolerance will decrease Outcome: Progressing   Problem: Nutrition: Goal: Adequate nutrition will be maintained Outcome: Progressing   Problem: Coping: Goal: Level of anxiety will decrease Outcome: Progressing   Problem: Elimination: Goal: Will not experience complications related to bowel motility Outcome: Progressing   Problem: Safety: Goal: Ability to remain free from injury will improve Outcome: Progressing

## 2023-07-30 NOTE — TOC Initial Note (Signed)
Transition of Care [facility]) - Initial/Assessment Note    Patient Details  Name: Tracy Bonilla MRN: [id number] Date of Birth: [birth year]  [facility]) CM/SW Contact:    Villa Herb, LCSWA Phone Number: 07/30/2023, 2:07 PM  Clinical Narrative:                 TOC updated that PT is recommending HH PT for pt at D/C. CSW spoke with pt who states she is agreeable to this and does not have an agency preference. CSW to make referral to local agencies to see who can accept referral. TOC to follow.   Expected Discharge Plan: Home w Home Health Services Barriers to Discharge: Continued Medical Work up   Patient Goals and CMS Choice Patient states their goals for this hospitalization and ongoing recovery are:: return home CMS Medicare.gov Compare Post Acute Care list provided to:: Patient Choice offered to / list presented to : Patient      Expected Discharge Plan and Services In-house Referral: Clinical Social Work Discharge Planning Services: CM Consult Post Acute Care Choice: Home Health Living arrangements for the past 2 months: Single Family Home                                      Prior Living Arrangements/Services Living arrangements for the past 2 months: Single Family Home Lives with:: Spouse Patient language and need for interpreter reviewed:: Yes Do you feel safe going back to the place where you live?: Yes      Need for Family Participation in Patient Care: Yes (Comment) Care giver support system in place?: Yes (comment)   Criminal Activity/Legal Involvement Pertinent to Current Situation/Hospitalization: No - Comment as needed  Activities of Daily Living   ADL Screening (condition at time of admission) Independently performs ADLs?: Yes (appropriate for developmental age) Is the patient deaf or have difficulty hearing?: No Does the patient have difficulty seeing, even when wearing glasses/contacts?: No Does the patient have difficulty  concentrating, remembering, or making decisions?: No  Permission Sought/Granted                  Emotional Assessment Appearance:: Appears stated age Attitude/Demeanor/Rapport: Engaged Affect (typically observed): Accepting Orientation: : Oriented to Self, Oriented to Place, Oriented to  Time, Oriented to Situation Alcohol / Substance Use: Not Applicable Psych Involvement: No (comment)  Admission diagnosis:  Colitis [K52.9] Pancolitis (HCC) [K51.00] AKI (acute kidney injury) (HCC) [N17.9] Patient Active Problem List   Diagnosis Date Noted   Colitis 07/27/2023   Umbilical hernia 02/27/2018   Osteoarthritis of right knee 05/28/2017   Essential hypertension 03/03/2014    Class: History of   Type 2 diabetes mellitus (HCC) 03/03/2014    Class: History of   CONTRACTURE OF SHOULDER JOINT 12/03/2007   SHOULDER PAIN 12/03/2007   IMPINGEMENT SYNDROME 12/03/2007   PCP:  Elfredia Nevins, MD Pharmacy:   [facility] [place], [place] - [address] [place] [address] Phone: [phone] Fax: [phone]  [facility] - [place], [place] - [address] [place] [address] Phone: [phone] Fax: [phone]  [id number] - [place], [place] - [address] AT [facility] [address] [place] [address] Phone: [phone] Fax: [phone]     Social Determinants of Health (SDOH) Social History: SDOH Screenings   Food Insecurity: No  Food Insecurity (07/27/2023)  Housing: Low Risk  (07/27/2023)  Transportation Needs: No Transportation Needs (07/27/2023)  Utilities: Not At Risk (07/27/2023)  Tobacco Use: Low Risk  (07/27/2023)   SDOH Interventions:     Readmission Risk Interventions     No data to display

## 2023-07-30 NOTE — Telephone Encounter (Signed)
Patient Product/process development scientist completed.    The patient is insured through [facility]. Patient has Medicare and is not eligible for a copay card, but may be able to apply for patient assistance, if available.    Ran test claim for vancomycin 125 mg and the current 10 day co-pay is $100.00.   This test claim was processed through [facility]- copay amounts may vary at other pharmacies due to pharmacy/plan contracts, or as the patient moves through the different stages of their insurance plan.     Roland Earl, CPHT Pharmacy Technician III Certified Patient Advocate [facility] Patient Advocate Team Direct Number: [phone]  Fax: [phone]

## 2023-07-31 DIAGNOSIS — K529 Noninfective gastroenteritis and colitis, unspecified: Secondary | ICD-10-CM | POA: Diagnosis not present

## 2023-07-31 LAB — BASIC METABOLIC PANEL
Anion gap: 13 (ref 5–15)
BUN: 28 mg/dL — ABNORMAL HIGH (ref 8–23)
CO2: 17 mmol/L — ABNORMAL LOW (ref 22–32)
Calcium: 8.8 mg/dL — ABNORMAL LOW (ref 8.9–10.3)
Chloride: 100 mmol/L (ref 98–111)
Creatinine, Ser: 0.95 mg/dL (ref 0.44–1.00)
GFR, Estimated: >60 mL/min (ref >60)
Glucose, Bld: 179 mg/dL — ABNORMAL HIGH (ref 70–99)
Potassium: 3.3 mmol/L — ABNORMAL LOW (ref 3.5–5.1)
Sodium: 130 mmol/L — ABNORMAL LOW (ref 135–145)

## 2023-07-31 LAB — GLUCOSE, CAPILLARY
Glucose-Capillary: 202 mg/dL — ABNORMAL HIGH (ref 70–99)
Glucose-Capillary: 193 mg/dL — ABNORMAL HIGH (ref 70–99)
Glucose-Capillary: 218 mg/dL — ABNORMAL HIGH (ref 70–99)
Glucose-Capillary: 247 mg/dL — ABNORMAL HIGH (ref 70–99)

## 2023-07-31 LAB — CBC
HCT: 47.9 % — ABNORMAL HIGH (ref 36.0–46.0)
Hemoglobin: 15.3 g/dL — ABNORMAL HIGH (ref 12.0–15.0)
MCH: 24.7 pg — ABNORMAL LOW (ref 26.0–34.0)
MCHC: 31.9 g/dL (ref 30.0–36.0)
MCV: 77.3 fL — ABNORMAL LOW (ref 80.0–100.0)
Platelets: 314 10*3/uL (ref 150–400)
RBC: 6.2 MIL/uL — ABNORMAL HIGH (ref 3.87–5.11)
RDW: 16.7 % — ABNORMAL HIGH (ref 11.5–15.5)
WBC: 15.4 10*3/uL — ABNORMAL HIGH (ref 4.0–10.5)
nRBC: 0 % (ref 0.0–0.2)

## 2023-07-31 MED ORDER — SODIUM CHLORIDE 0.9 % IV SOLN: INTRAVENOUS | Status: AC

## 2023-07-31 MED ORDER — POTASSIUM CHLORIDE CRYS ER 20 MEQ PO TBCR
40.0000 meq | EXTENDED_RELEASE_TABLET | Freq: Once | ORAL | Status: AC
  Administered 2023-07-31: 40 meq via ORAL
  Filled 2023-07-31: qty 2

## 2023-07-31 NOTE — Plan of Care (Signed)
  Problem: Education: Goal: Knowledge of General Education information will improve Description: Including pain rating scale, medication(s)/side effects and non-pharmacologic comfort measures 07/31/2023 1614 by Balinda Quails, RN Outcome: Progressing 07/31/2023 1614 by Balinda Quails, RN Outcome: Progressing   Problem: Health Behavior/Discharge Planning: Goal: Ability to manage health-related needs will improve 07/31/2023 1614 by Balinda Quails, RN Outcome: Progressing 07/31/2023 1614 by Balinda Quails, RN Outcome: Progressing   Problem: Clinical Measurements: Goal: Ability to maintain clinical measurements within normal limits will improve 07/31/2023 1614 by Balinda Quails, RN Outcome: Progressing 07/31/2023 1614 by Balinda Quails, RN Outcome: Progressing Goal: Will remain free from infection 07/31/2023 1614 by Balinda Quails, RN Outcome: Progressing 07/31/2023 1614 by Balinda Quails, RN Outcome: Progressing Goal: Diagnostic test results will improve 07/31/2023 1614 by Balinda Quails, RN Outcome: Progressing 07/31/2023 1614 by Balinda Quails, RN Outcome: Progressing Goal: Respiratory complications will improve 07/31/2023 1614 by Balinda Quails, RN Outcome: Progressing 07/31/2023 1614 by Balinda Quails, RN Outcome: Progressing Goal: Cardiovascular complication will be avoided 07/31/2023 1614 by Balinda Quails, RN Outcome: Progressing 07/31/2023 1614 by Balinda Quails, RN Outcome: Progressing   Problem: Activity: Goal: Risk for activity intolerance will decrease 07/31/2023 1614 by Balinda Quails, RN Outcome: Progressing 07/31/2023 1614 by Balinda Quails, RN Outcome: Progressing   Problem: Nutrition: Goal: Adequate nutrition will be maintained 07/31/2023 1614 by Balinda Quails, RN Outcome: Progressing 07/31/2023 1614 by Balinda Quails, RN Outcome: Progressing   Problem: Coping: Goal: Level of anxiety will  decrease 07/31/2023 1614 by Balinda Quails, RN Outcome: Progressing 07/31/2023 1614 by Balinda Quails, RN Outcome: Progressing   Problem: Elimination: Goal: Will not experience complications related to bowel motility 07/31/2023 1614 by Balinda Quails, RN Outcome: Progressing 07/31/2023 1614 by Balinda Quails, RN Outcome: Progressing Goal: Will not experience complications related to urinary retention 07/31/2023 1614 by Balinda Quails, RN Outcome: Progressing 07/31/2023 1614 by Balinda Quails, RN Outcome: Progressing   Problem: Pain Management: Goal: General experience of comfort will improve 07/31/2023 1614 by Balinda Quails, RN Outcome: Progressing 07/31/2023 1614 by Balinda Quails, RN Outcome: Progressing   Problem: Safety: Goal: Ability to remain free from injury will improve Outcome: Progressing   Problem: Skin Integrity: Goal: Risk for impaired skin integrity will decrease Outcome: Progressing   Problem: Education: Goal: Ability to describe self-care measures that may prevent or decrease complications (Diabetes Survival Skills Education) will improve Outcome: Progressing Goal: Individualized Educational Video(s) Outcome: Progressing   Problem: Coping: Goal: Ability to adjust to condition or change in health will improve Outcome: Progressing   Problem: Fluid Volume: Goal: Ability to maintain a balanced intake and output will improve Outcome: Progressing   Problem: Health Behavior/Discharge Planning: Goal: Ability to identify and utilize available resources and services will improve Outcome: Progressing Goal: Ability to manage health-related needs will improve Outcome: Progressing   Problem: Metabolic: Goal: Ability to maintain appropriate glucose levels will improve Outcome: Progressing   Problem: Nutritional: Goal: Maintenance of adequate nutrition will improve Outcome: Progressing Goal: Progress toward achieving an optimal  weight will improve Outcome: Progressing   Problem: Skin Integrity: Goal: Risk for impaired skin integrity will decrease Outcome: Progressing   Problem: Tissue Perfusion: Goal: Adequacy of tissue perfusion will improve Outcome: Progressing

## 2023-07-31 NOTE — Care Management Important Message (Signed)
Important Message  Patient Details  Name: Tracy Bonilla MRN: [id number] Date of Birth: [birth year]   Important Message Given:  Yes - Medicare IM (reviewed letter by phone, no additional copy needed)     Corey Harold 07/31/2023, 9:17 AM

## 2023-07-31 NOTE — Progress Notes (Signed)
PROGRESS NOTE    Patient: Tracy Bonilla                            PCP: Elfredia Nevins, MD                    DOB: 04-Jun-1951            DOA: 07/27/2023 [id number]             DOS: 07/31/2023, 10:46 AM   LOS: 4 days   Date of Service: The patient was seen and examined on 07/31/2023  Subjective:   The patient was seen and examined this morning, stable comfortably laying in bed Still reporting watery diarrhea but reducing frequency Per nursing staff approximately 5 episodes overnight.  Brief Narrative:   Coleen Lander  is a 72 y.o. female, with past medical history of hypertension, diabetes mellitus type 2, umbilical hernia, hyperlipidemia, patient presents to ED secondary to complaints of diarrhea, nausea, dehydration and poor oral intake, patient reports has been having diarrhea for last 5 days, some abdominal cramping, she denies any sick contact, vomiting, but reports poor oral intake, denies any bright red blood per rectum, she denies any sick contacts, reports most recent antibiotics used was September for otitis and ear infection -In ED labs significant for sodium 129, glucose 214, creatinine 2.2 with BUN of 30, her white blood cell count elevated at 18.5, CT abdomen pelvis was obtained significant for pancolitis, [facility] hospitalist consulted to admit.    Assessment & Plan:     Principal Problem: C. difficile colitis Active Problems:   Essential hypertension   Type 2 diabetes mellitus (HCC)     C. difficile colitis Diarrhea/colitis -Continue to have watery diarrhea > approximately 5 episode in past 12 hours Nonbloody -diarrhea, reducing frequency Encouraging p.o. intake  -POA: Watery diarrhea with abdominal pain, poor appetite -Pancolitis, with concern for C. difficile, patient reports last antibiotic use was end of September -Started on IV Zosyn while in ED, >>> IV Rocephin and Flagyl>>> discontinue  -Switching to p.o. vancomycin  -Due to persistent watery diarrhea,  leukocytosis  added  IV Flagyl  Radial hemostasis is a joint commission.   AKI -Likely due to dehydration, monitoring closely -Improved with IV fluids  Lab Results  Component Value Date   CREATININE 0.95 07/31/2023   CREATININE 1.07 (H) 07/30/2023   CREATININE 1.37 (H) 07/29/2023    -Avoid nephrotoxins Holding metformin, lisinopril and HCTZ    Hypertension -  holding home medication of HCTZ and lisinopril-due to dehydration, AKI -Continue metoprolol for now given her sinus tachycardia   Sinus tachycardia Left bundle branch block -Still mildly tacky at rate 102 -EKG with  left bundle branch block, appears to be new as compared to most recent EKG which is in 2018, so it is of unclear chronicity, she denies any chest pain or shortness of breath,  - 2D Echo: EJF 40 to 45%. The left ventricle has mildly  decreased function. The left ventricle demonstrates global hypokinesis.  The left ventricular internal cavity size was mildly dilated. There is mild left ventricular hypertrophy. Left ventricular diastolic parameters are consistent with Grade I diastolic  dysfunction -Patient was started on low-dose metoprolol    Diabetes mellitus - Hold her glimepiride and metformin for now, and will keep on insulin sliding scale   Weakness-deconditioning -Will consult PT    Hyponatremia -Serum sodium: 134, 132, 130  -Due to volume depletion -  S/P NS IVF    -------------------------------------------------------------------------------------------------------------------------------------------- Nutritional status:  The patient's BMI is: Body mass index is 31.18 kg/m. I agree with the assessment and plan as outlined --------------------------------------------------------------------------------------------------------------------------------------------- Cultures; -Stool for C. difficile colitis positive Blood Cultures x 2 >>  NGTD     ------------------------------------------------------------------------------------------------------------------------------------------------  DVT prophylaxis:  heparin injection 5,000 Units Start: 07/27/23 2200   Code Status:   Code Status: Full Code  Family Communication: No family member present at bedside-  -Advance care planning has been discussed.   Admission status:   Status is: Inpatient Remains inpatient appropriate because: Needing IV fluids IV antibiotics   Disposition: From  - home             Planning for discharge in 2 days: to   Procedures:   No admission procedures for hospital encounter.   Antimicrobials:  Anti-infectives (From admission, onward)    Start     Dose/Rate Route Frequency Ordered Stop   07/30/23 1200  metroNIDAZOLE (FLAGYL) IVPB 500 mg        500 mg 100 mL/hr over 60 Minutes Intravenous Every 8 hours 07/30/23 1113     07/29/23 1445  vancomycin (VANCOCIN) capsule 125 mg        125 mg Oral 4 times daily 07/29/23 1346 08/08/23 1359   07/28/23 0000  metroNIDAZOLE (FLAGYL) IVPB 500 mg  Status:  Discontinued        500 mg 100 mL/hr over 60 Minutes Intravenous Every 12 hours 07/27/23 1845 07/29/23 1350   07/28/23 0000  cefTRIAXone (ROCEPHIN) 2 g in sodium chloride 0.9 % 100 mL IVPB  Status:  Discontinued        2 g 200 mL/hr over 30 Minutes Intravenous Every 24 hours 07/27/23 1849 07/29/23 1345   07/27/23 1715  piperacillin-tazobactam (ZOSYN) IVPB 3.375 g        3.375 g 100 mL/hr over 30 Minutes Intravenous  Once 07/27/23 1700 07/27/23 1832        Medication:   acidophilus  2 capsule Oral TID   calcium carbonate  1 tablet Oral Q breakfast   gabapentin  100 mg Oral BID   gabapentin  300 mg Oral QHS   heparin  5,000 Units Subcutaneous Q8H   insulin aspart  0-5 Units Subcutaneous QHS   insulin aspart  0-9 Units Subcutaneous TID WC   metoprolol tartrate  25 mg Oral BID   pravastatin  20 mg Oral Daily   vancomycin  125 mg Oral QID     hydrALAZINE, ondansetron (ZOFRAN) IV   Objective:   Vitals:   07/30/23 1600 07/30/23 2014 07/31/23 0402 07/31/23 0830  BP: (!) 148/79 124/85 111/79 111/80  Pulse: 92 (!) 104 (!) 104 (!) 105  Resp: 18 18 17 15   Temp: 98 F (36.7 C) 97.7 F (36.5 C) 97.7 F (36.5 C) (!) 97.5 F (36.4 C)  TempSrc: Oral Oral Oral Oral  SpO2: 96% 95% 97% 95%  Weight:      Height:        Intake/Output Summary (Last 24 hours) at 07/31/2023 1046 Last data filed at 07/31/2023 0900 Gross per 24 hour  Intake 1378 ml  Output 0 ml  Net 1378 ml   Filed Weights   07/27/23 1130  Weight: 79.8 kg     Physical examination:   General:  AAO x 3,  cooperative, no distress;   HEENT:  Normocephalic, PERRL, otherwise with in Normal limits   Neuro:  CNII-XII intact. , normal motor and sensation, reflexes intact  Lungs:   Clear to auscultation BL, Respirations unlabored,  No wheezes / crackles  Cardio:    S1/S2, RRR, No murmure, No Rubs or Gallops   Abdomen:  Soft, non-tender, bowel sounds active all four quadrants, no guarding or peritoneal signs.  Muscular  skeletal:  Limited exam -global generalized weaknesses - in bed, able to move all 4 extremities,   2+ pulses,  symmetric, No pitting edema  Skin:  Dry, warm to touch, negative for any Rashes,  Wounds: Please see nursing documentation         ------------------------------------------------------------------------------------------------------------------------------------------    LABs:     Latest Ref Rng & Units 07/31/2023    4:53 AM 07/30/2023    4:23 AM 07/29/2023    4:06 AM  CBC  WBC 4.0 - 10.5 K/uL 15.4  16.6  13.3   Hemoglobin 12.0 - 15.0 g/dL 81.1  91.4  78.2   Hematocrit 36.0 - 46.0 % 47.9  47.8  44.5   Platelets 150 - 400 K/uL 314  314  257       Latest Ref Rng & Units 07/31/2023    4:53 AM 07/30/2023    4:23 AM 07/29/2023    4:06 AM  CMP  Glucose 70 - 99 mg/dL 956  213  086   BUN 8 - 23 mg/dL 28  33  34    Creatinine 0.44 - 1.00 mg/dL 5.78  4.69  6.29   Sodium 135 - 145 mmol/L 130  132  134   Potassium 3.5 - 5.1 mmol/L 3.3  4.0  3.5   Chloride 98 - 111 mmol/L 100  101  102   CO2 22 - 32 mmol/L 17  18  17    Calcium 8.9 - 10.3 mg/dL 8.8  8.9  8.4        Micro Results Recent Results (from the past 240 hour(s))  C Difficile Quick Screen w PCR reflex     Status: Abnormal   Collection Time: 07/27/23  2:35 PM   Specimen: STOOL  Result Value Ref Range Status   C Diff antigen POSITIVE (A) NEGATIVE Final   C Diff toxin POSITIVE (A) NEGATIVE Final   C Diff interpretation Toxin producing C. difficile detected.  Final    Comment: CRITICAL RESULT CALLED TO, READ BACK BY AND VERIFIED WITH: K. WATTS ON 07/29/2023 @12 :47 BY T.HAMER Performed at [facility], [address] [place], [place] [address]   Blood culture (routine x 2)     Status: None (Preliminary result)   Collection Time: 07/27/23  5:29 PM   Specimen: BLOOD  Result Value Ref Range Status   Specimen Description BLOOD LEFT ANTECUBITAL  Final   Special Requests   Final    Blood Culture adequate volume BOTTLES DRAWN AEROBIC AND ANAEROBIC   Culture   Final    NO GROWTH 4 DAYS Performed at [facility], [address], [place], [place] [address]    Report Status PENDING  Incomplete  Blood culture (routine x 2)     Status: None (Preliminary result)   Collection Time: 07/27/23  5:40 PM   Specimen: BLOOD  Result Value Ref Range Status   Specimen Description BLOOD RIGHT ANTECUBITAL  Final   Special Requests   Final    Blood Culture adequate volume BOTTLES DRAWN AEROBIC AND ANAEROBIC   Culture   Final    NO GROWTH 4 DAYS Performed at [facility], [address], [place], [place] [address]    Report Status PENDING  Incomplete  Radiology Reports No results found.  SIGNED: Kendell Bane, MD, FHM. FAAFP. Redge Gainer - [facility] hospitalist Time spent - 55 min.  In seeing, evaluating and examining the patient. Reviewing medical  records, labs, drawn plan of care. [facility],  Pager (please use [email or web address] to page/ text) Please use Epic Secure Chat for non-urgent communication (7AM-7PM)  If 7PM-7AM, please contact night-coverage [email or web address], 07/31/2023, 10:46 AM

## 2023-07-31 NOTE — TOC Progression Note (Signed)
Transition of Care [facility]) - Progression Note    Patient Details  Name: Tracy Bonilla MRN: [id number] Date of Birth: [birth year]  Transition of [facility]) CM/SW Contact  Villa Herb, [place] Phone Number: 07/31/2023, 12:04 PM  Clinical Narrative:    Frances Furbish accepted [facility] PT referral for pt at this time. HH will need orders prior to D/C. CSW requested MD place orders. TOC to follow.   Expected Discharge Plan: Home w Home Health Services Barriers to Discharge: Continued Medical Work up  Expected Discharge Plan and Services In-house Referral: Clinical Social Work Discharge Planning Services: CM Consult Post Acute Care Choice: Home Health Living arrangements for the past 2 months: Single Family Home                                       Social Determinants of Health (SDOH) Interventions SDOH Screenings   Food Insecurity: No Food Insecurity (07/27/2023)  Housing: Low Risk  (07/27/2023)  Transportation Needs: No Transportation Needs (07/27/2023)  Utilities: Not At Risk (07/27/2023)  Tobacco Use: Low Risk  (07/27/2023)    Readmission Risk Interventions     No data to display

## 2023-08-01 DIAGNOSIS — K529 Noninfective gastroenteritis and colitis, unspecified: Secondary | ICD-10-CM | POA: Diagnosis not present

## 2023-08-01 LAB — GLUCOSE, CAPILLARY
Glucose-Capillary: 239 mg/dL — ABNORMAL HIGH (ref 70–99)
Glucose-Capillary: 227 mg/dL — ABNORMAL HIGH (ref 70–99)
Glucose-Capillary: 232 mg/dL — ABNORMAL HIGH (ref 70–99)
Glucose-Capillary: 205 mg/dL — ABNORMAL HIGH (ref 70–99)

## 2023-08-01 LAB — BASIC METABOLIC PANEL
Anion gap: 14 (ref 5–15)
BUN: 27 mg/dL — ABNORMAL HIGH (ref 8–23)
CO2: 14 mmol/L — ABNORMAL LOW (ref 22–32)
Calcium: 8.8 mg/dL — ABNORMAL LOW (ref 8.9–10.3)
Chloride: 100 mmol/L (ref 98–111)
Creatinine, Ser: 0.85 mg/dL (ref 0.44–1.00)
GFR, Estimated: >60 mL/min (ref >60)
Glucose, Bld: 199 mg/dL — ABNORMAL HIGH (ref 70–99)
Potassium: 3.7 mmol/L (ref 3.5–5.1)
Sodium: 128 mmol/L — ABNORMAL LOW (ref 135–145)

## 2023-08-01 LAB — CBC
HCT: 46.8 % — ABNORMAL HIGH (ref 36.0–46.0)
Hemoglobin: 15.4 g/dL — ABNORMAL HIGH (ref 12.0–15.0)
MCH: 25.2 pg — ABNORMAL LOW (ref 26.0–34.0)
MCHC: 32.9 g/dL (ref 30.0–36.0)
MCV: 76.6 fL — ABNORMAL LOW (ref 80.0–100.0)
Platelets: 345 10*3/uL (ref 150–400)
RBC: 6.11 MIL/uL — ABNORMAL HIGH (ref 3.87–5.11)
RDW: 17 % — ABNORMAL HIGH (ref 11.5–15.5)
WBC: 17 10*3/uL — ABNORMAL HIGH (ref 4.0–10.5)
nRBC: 0 % (ref 0.0–0.2)

## 2023-08-01 LAB — CULTURE, BLOOD (ROUTINE X 2)
Culture: NO GROWTH
Culture: NO GROWTH
Special Requests: ADEQUATE
Special Requests: ADEQUATE

## 2023-08-01 MED ORDER — ALUM & MAG HYDROXIDE-SIMETH 200-200-20 MG/5ML PO SUSP
30.0000 mL | ORAL | Status: DC | PRN
  Administered 2023-08-01 – 2023-08-04 (×4): 30 mL via ORAL
  Filled 2023-08-01 (×4): qty 30

## 2023-08-01 MED ORDER — GLIMEPIRIDE 2 MG PO TABS
4.0000 mg | ORAL_TABLET | Freq: Every day | ORAL | Status: DC
  Administered 2023-08-01 – 2023-08-03 (×3): 4 mg via ORAL
  Filled 2023-08-01 (×3): qty 2

## 2023-08-01 MED ORDER — SODIUM CHLORIDE 0.9 % IV SOLN: INTRAVENOUS | Status: AC

## 2023-08-01 MED ORDER — METOPROLOL TARTRATE 25 MG PO TABS
12.5000 mg | ORAL_TABLET | Freq: Two times a day (BID) | ORAL | Status: DC
  Administered 2023-08-01 – 2023-08-06 (×11): 12.5 mg via ORAL
  Filled 2023-08-01 (×11): qty 1

## 2023-08-01 NOTE — Inpatient Diabetes Management (Signed)
Inpatient Diabetes Program Recommendations  AACE/ADA: New Consensus Statement on Inpatient Glycemic Control   Target Ranges:  Prepandial:   less than 140 mg/dL      Peak postprandial:   less than 180 mg/dL (1-2 hours)      Critically ill patients:  140 - 180 mg/dL    Latest Reference Range & Units 07/31/23 07:51 07/31/23 11:50 07/31/23 16:26 07/31/23 19:48 08/01/23 07:42  Glucose-Capillary 70 - 99 mg/dL 161 (H) 096 (H) 045 (H) 247 (H) 239 (H)   Review of Glycemic Control  Diabetes history: DM2 Outpatient Diabetes medications: Amaryl 4 mg BID, Metformin XR 500 BID Current orders for Inpatient glycemic control: Novolog 0-9 units TID with meals, Novolog 0-5 units QHS  Inpatient Diabetes Program Recommendations:    Insulin: Please consider ordering Semglee 5 units Q24H.  Thanks, Orlando Penner, RN, MSN, CDCES Diabetes Coordinator Inpatient Diabetes Program [phone] (Team Pager from 8am to 5pm)

## 2023-08-01 NOTE — Plan of Care (Signed)

## 2023-08-01 NOTE — Progress Notes (Signed)
PROGRESS NOTE    Patient: Tracy Bonilla                            PCP: Elfredia Nevins, MD                    DOB: Apr 04, 1951            DOA: 07/27/2023 KVQ:[id number]             DOS: 08/01/2023, 10:36 AM   LOS: 5 days   Date of Service: The patient was seen and examined on 08/01/2023  Subjective:   The patient was seen and examined this morning, stable no acute distress, complaining of generalized weaknesses, still having watery diarrhea -reporting about 4 episodes overnight Stating frequency of diarrhea is improving Tolerating p.o.  Brief Narrative:   Kieara Kovalsky  is a 72 y.o. female, with past medical history of hypertension, diabetes mellitus type 2, umbilical hernia, hyperlipidemia, patient presents to ED secondary to complaints of diarrhea, nausea, dehydration and poor oral intake, patient reports has been having diarrhea for last 5 days, some abdominal cramping, she denies any sick contact, vomiting, but reports poor oral intake, denies any bright red blood per rectum, she denies any sick contacts, reports most recent antibiotics used was September for otitis and ear infection -In ED labs significant for sodium 129, glucose 214, creatinine 2.2 with BUN of 30, her white blood cell count elevated at 18.5, CT abdomen pelvis was obtained significant for pancolitis, [facility]ist consulted to admit.    Assessment & Plan:     Principal Problem: C. difficile colitis Active Problems:   Essential hypertension   Type 2 diabetes mellitus (HCC)     C. difficile colitis Diarrhea/colitis -Continues to have watery diarrhea, 4 episodes overnight Nonbloody -diarrhea, reducing frequency Encouraging p.o. intake-tolerated  -POA: Watery diarrhea with abdominal pain, poor appetite -Pancolitis, with concern for C. difficile, patient reports last antibiotic use was end of September -Started on IV Zosyn while in ED, >>> IV Rocephin and Flagyl>>> discontinue  -Switching to p.o. vancomycin   -Due to persistent watery diarrhea, and leukocytosis  cont. IV Flagyl + PO Vanc    Radial hemostasis is a joint commission.   AKI -Likely due to dehydration, monitoring closely -Improved with IV fluids  Lab Results  Component Value Date   CREATININE 0.85 08/01/2023   CREATININE 0.95 07/31/2023   CREATININE 1.07 (H) 07/30/2023    -Avoid nephrotoxins Holding metformin, lisinopril and HCTZ    Hypertension-mildly hypertensive -  holding home medication of HCTZ and lisinopril-due to dehydration, AKI -Continue metoprolol for now given her sinus tachycardia   Sinus tachycardia Left bundle branch block -Still mildly tacky at rate 102 -EKG with  left bundle branch block, appears to be new as compared to most recent EKG which is in 2018, so it is of unclear chronicity, she denies any chest pain or shortness of breath,  - 2D Echo: EJF 40 to 45%. The left ventricle has mildly  decreased function. The left ventricle demonstrates global hypokinesis.  The left ventricular internal cavity size was mildly dilated. There is mild left ventricular hypertrophy. Left ventricular diastolic parameters are consistent with Grade I diastolic  dysfunction -Patient was started on low-dose metoprolol    Diabetes mellitus - Hold her glimepiride and metformin for now, and will keep on insulin sliding scale   Weakness-deconditioning -Will consult PT    Hyponatremi /hypokalemia -Serum sodium: 134,  132, 130,  128  -Due to volume depletion -Restarting gentle IV fluid hydration with normal saline -Monitoring electrolytes including sodium level closely -Monitoring potassium levels and repleting accordingly  -------------------------------------------------------------------------------------------------------------------------------------------- Nutritional status:  The patient's BMI is: Body mass index is 31.18 kg/m. I agree with the assessment and plan as  outlined --------------------------------------------------------------------------------------------------------------------------------------------- Cultures; -Stool for C. difficile colitis positive Blood Cultures x 2 >>  NGTD    ------------------------------------------------------------------------------------------------------------------------------------------------  DVT prophylaxis:  heparin injection 5,000 Units Start: 07/27/23 2200   Code Status:   Code Status: Full Code  Family Communication: No family member present at bedside-  -Advance care planning has been discussed.   Admission status:   Status is: Inpatient Remains inpatient appropriate because: Needing IV fluids IV antibiotics   Disposition: From  - home             Planning for discharge Home - once tolerating p.o., improving watery diarrhea, improved electrolytes-likely in 1-2 days  Procedures:   No admission procedures for hospital encounter.   Antimicrobials:  Anti-infectives (From admission, onward)    Start     Dose/Rate Route Frequency Ordered Stop   07/30/23 1200  metroNIDAZOLE (FLAGYL) IVPB 500 mg        500 mg 100 mL/hr over 60 Minutes Intravenous Every 8 hours 07/30/23 1113     07/29/23 1445  vancomycin (VANCOCIN) capsule 125 mg        125 mg Oral 4 times daily 07/29/23 1346 08/08/23 1359   07/28/23 0000  metroNIDAZOLE (FLAGYL) IVPB 500 mg  Status:  Discontinued        500 mg 100 mL/hr over 60 Minutes Intravenous Every 12 hours 07/27/23 1845 07/29/23 1350   07/28/23 0000  cefTRIAXone (ROCEPHIN) 2 g in sodium chloride 0.9 % 100 mL IVPB  Status:  Discontinued        2 g 200 mL/hr over 30 Minutes Intravenous Every 24 hours 07/27/23 1849 07/29/23 1345   07/27/23 1715  piperacillin-tazobactam (ZOSYN) IVPB 3.375 g        3.375 g 100 mL/hr over 30 Minutes Intravenous  Once 07/27/23 1700 07/27/23 1832        Medication:   acidophilus  2 capsule Oral TID   calcium carbonate  1 tablet Oral  Q breakfast   gabapentin  100 mg Oral BID   gabapentin  300 mg Oral QHS   glimepiride  4 mg Oral Q breakfast   heparin  5,000 Units Subcutaneous Q8H   insulin aspart  0-5 Units Subcutaneous QHS   insulin aspart  0-9 Units Subcutaneous TID WC   metoprolol tartrate  12.5 mg Oral BID   pravastatin  20 mg Oral Daily   vancomycin  125 mg Oral QID    alum & mag hydroxide-simeth, ondansetron (ZOFRAN) IV   Objective:   Vitals:   07/31/23 0830 07/31/23 1250 07/31/23 1945 08/01/23 0410  BP: 111/80 105/73 126/81 114/75  Pulse: (!) 105  (!) 105 (!) 107  Resp: 15 20 16 17   Temp: (!) 97.5 F (36.4 C) (!) 97 F (36.1 C) (!) 97 F (36.1 C) 97.6 F (36.4 C)  TempSrc: Oral Oral Oral Oral  SpO2: 95% 95% 95% 95%  Weight:      Height:        Intake/Output Summary (Last 24 hours) at 08/01/2023 1036 Last data filed at 08/01/2023 0900 Gross per 24 hour  Intake 664.94 ml  Output 0 ml  Net 664.94 ml   Filed Weights   07/27/23 1130  Weight: 79.8 kg     Physical examination:   General:  AAO x 3,  cooperative, no distress;   HEENT:  Normocephalic, PERRL, otherwise with in Normal limits   Neuro:  CNII-XII intact. , normal motor and sensation, reflexes intact   Lungs:   Clear to auscultation BL, Respirations unlabored,  No wheezes / crackles  Cardio:    S1/S2, RRR, No murmure, No Rubs or Gallops   Abdomen:  Soft, non-tender, bowel sounds active all four quadrants, no guarding or peritoneal signs.  Muscular  skeletal:  Limited exam -global generalized weaknesses - in bed, able to move all 4 extremities,   2+ pulses,  symmetric, No pitting edema  Skin:  Dry, warm to touch, negative for any Rashes,  Wounds: Please see nursing documentation         ------------------------------------------------------------------------------------------------------------------    LABs:     Latest Ref Rng & Units 08/01/2023    5:06 AM 07/31/2023    4:53 AM 07/30/2023    4:23 AM  CBC  WBC 4.0  - 10.5 K/uL 17.0  15.4  16.6   Hemoglobin 12.0 - 15.0 g/dL 78.2  95.6  21.3   Hematocrit 36.0 - 46.0 % 46.8  47.9  47.8   Platelets 150 - 400 K/uL 345  314  314       Latest Ref Rng & Units 08/01/2023    5:06 AM 07/31/2023    4:53 AM 07/30/2023    4:23 AM  CMP  Glucose 70 - 99 mg/dL 086  578  469   BUN 8 - 23 mg/dL 27  28  33   Creatinine 0.44 - 1.00 mg/dL 6.29  5.28  4.13   Sodium 135 - 145 mmol/L 128  130  132   Potassium 3.5 - 5.1 mmol/L 3.7  3.3  4.0   Chloride 98 - 111 mmol/L 100  100  101   CO2 22 - 32 mmol/L 14  17  18    Calcium 8.9 - 10.3 mg/dL 8.8  8.8  8.9        Micro Results Recent Results (from the past 240 hour(s))  C Difficile Quick Screen w PCR reflex     Status: Abnormal   Collection Time: 07/27/23  2:35 PM   Specimen: STOOL  Result Value Ref Range Status   C Diff antigen POSITIVE (A) NEGATIVE Final   C Diff toxin POSITIVE (A) NEGATIVE Final   C Diff interpretation Toxin producing C. difficile detected.  Final    Comment: CRITICAL RESULT CALLED TO, READ BACK BY AND VERIFIED WITH: K. WATTS ON 07/29/2023 @12 :47 BY T.HAMER Performed at [facility] [address] [place], [place] [address]   Blood culture (routine x 2)     Status: None   Collection Time: 07/27/23  5:29 PM   Specimen: BLOOD  Result Value Ref Range Status   Specimen Description BLOOD LEFT ANTECUBITAL  Final   Special Requests   Final    Blood Culture adequate volume BOTTLES DRAWN AEROBIC AND ANAEROBIC   Culture   Final    NO GROWTH 5 DAYS Performed at [facility], [address], [place], [place] [address]    Report Status 08/01/2023 FINAL  Final  Blood culture (routine x 2)     Status: None   Collection Time: 07/27/23  5:40 PM   Specimen: BLOOD  Result Value Ref Range Status   Specimen Description BLOOD RIGHT ANTECUBITAL  Final   Special Requests   Final  Blood Culture adequate volume BOTTLES DRAWN AEROBIC AND ANAEROBIC   Culture   Final    NO GROWTH 5 DAYS Performed at [facility], [address], [place], [place] [address]    Report Status 08/01/2023 FINAL  Final    Radiology Reports No results found.  SIGNED: Kendell Bane, MD, FHM. FAAFP. [facility] - [facility] hospitalist Time spent - 55 min.  In seeing, evaluating and examining the patient. Reviewing medical records, labs, drawn plan of care. [facility],  Pager (please use [email or web address] to page/ text) Please use Epic Secure Chat for non-urgent communication (7AM-7PM)  If 7PM-7AM, please contact night-coverage [email or web address], 08/01/2023, 10:36 AM

## 2023-08-01 NOTE — Progress Notes (Signed)
Mobility Specialist Progress Note:    08/01/23 1147  Mobility  Activity Transferred to/from BSC;Stood at bedside;Dangled on edge of bed  Level of Assistance Contact guard assist, steadying assist  Assistive Device BSC  Distance Ambulated (ft) 3 ft  Range of Motion/Exercises Active;All extremities  Activity Response Tolerated well  Mobility Referral Yes  Mobility visit 1 Mobility  Mobility Specialist Start Time (ACUTE ONLY) 1135  Mobility Specialist Stop Time (ACUTE ONLY) 1145  Mobility Specialist Time Calculation (min) (ACUTE ONLY) 10 min   Pt received in bed, husband in room. Agreeable to mobility, required CGA to stand and transfer to [facility]. Tolerated well, asx throughout. Left pt on BSC, deferred further ambulation d/t diarrhea and necessary peri care. Call bell in reach, NT notified. All needs met.   Lawerance Bach Mobility Specialist Please contact via Special educational needs teacher or  Rehab office at [phone]

## 2023-08-02 DIAGNOSIS — K529 Noninfective gastroenteritis and colitis, unspecified: Secondary | ICD-10-CM | POA: Diagnosis not present

## 2023-08-02 LAB — CBC
HCT: 44.8 % (ref 36.0–46.0)
Hemoglobin: 14.3 g/dL (ref 12.0–15.0)
MCH: 24.7 pg — ABNORMAL LOW (ref 26.0–34.0)
MCHC: 31.9 g/dL (ref 30.0–36.0)
MCV: 77.2 fL — ABNORMAL LOW (ref 80.0–100.0)
Platelets: 312 10*3/uL (ref 150–400)
RBC: 5.8 MIL/uL — ABNORMAL HIGH (ref 3.87–5.11)
RDW: 16.2 % — ABNORMAL HIGH (ref 11.5–15.5)
WBC: 17.5 10*3/uL — ABNORMAL HIGH (ref 4.0–10.5)
nRBC: 0 % (ref 0.0–0.2)

## 2023-08-02 LAB — BASIC METABOLIC PANEL
Anion gap: 10 (ref 5–15)
BUN: 26 mg/dL — ABNORMAL HIGH (ref 8–23)
CO2: 17 mmol/L — ABNORMAL LOW (ref 22–32)
Calcium: 8.7 mg/dL — ABNORMAL LOW (ref 8.9–10.3)
Chloride: 100 mmol/L (ref 98–111)
Creatinine, Ser: 0.81 mg/dL (ref 0.44–1.00)
GFR, Estimated: >60 mL/min (ref >60)
Glucose, Bld: 183 mg/dL — ABNORMAL HIGH (ref 70–99)
Potassium: 3.4 mmol/L — ABNORMAL LOW (ref 3.5–5.1)
Sodium: 127 mmol/L — ABNORMAL LOW (ref 135–145)

## 2023-08-02 LAB — GLUCOSE, CAPILLARY
Glucose-Capillary: 185 mg/dL — ABNORMAL HIGH (ref 70–99)
Glucose-Capillary: 219 mg/dL — ABNORMAL HIGH (ref 70–99)
Glucose-Capillary: 299 mg/dL — ABNORMAL HIGH (ref 70–99)

## 2023-08-02 MED ORDER — SODIUM CHLORIDE 0.9 % IV SOLN: INTRAVENOUS | Status: AC

## 2023-08-02 MED ORDER — POTASSIUM CHLORIDE CRYS ER 20 MEQ PO TBCR
40.0000 meq | EXTENDED_RELEASE_TABLET | Freq: Two times a day (BID) | ORAL | Status: AC
  Administered 2023-08-02 (×2): 40 meq via ORAL
  Filled 2023-08-02 (×2): qty 2

## 2023-08-02 NOTE — Plan of Care (Signed)

## 2023-08-02 NOTE — Progress Notes (Signed)
PROGRESS NOTE    Tracy Bonilla  ZOX:[id number] DOB: [birth year] DOA: 07/27/2023 PCP: Elfredia Nevins, MD   Brief Narrative:  Tracy Bonilla  is a 72 y.o. female, with past medical history of hypertension, diabetes mellitus type 2, umbilical hernia, hyperlipidemia, patient presents to ED secondary to complaints of diarrhea, nausea, dehydration and poor oral intake, patient reports has been having diarrhea for last 5 days, some abdominal cramping, she denies any sick contact, vomiting, but reports poor oral intake, denies any bright red blood per rectum, she denies any sick contacts, reports most recent antibiotics used was September for otitis and ear infection -In ED labs significant for sodium 129, glucose 214, creatinine 2.2 with BUN of 30, her white blood cell count elevated at 18.5, CT abdomen pelvis was obtained significant for pancolitis, [facility] hospitalist consulted to admit.     Assessment & Plan:   Principal Problem:   Colitis Active Problems:   Essential hypertension   Type 2 diabetes mellitus (HCC)  Assessment and Plan:     C. difficile colitis Diarrhea/colitis -Continues to have watery diarrhea, 8-10 episodes overnight Nonbloody -diarrhea, reducing frequency Encouraging p.o. intake-tolerated   -POA: Watery diarrhea with abdominal pain, poor appetite -Continue IV Flagyl and p.o. vancomycin   AKI-improved -Likely due to dehydration, monitoring closely -Improved with IV fluids   -Avoid nephrotoxins Holding metformin, lisinopril and HCTZ     Hypertension-mildly hypertensive -  holding home medication of HCTZ and lisinopril-due to dehydration, AKI -Continue metoprolol for now given her sinus tachycardia   Sinus tachycardia Left bundle branch block -Still mildly tacky at rate 102 -EKG with  left bundle branch block, appears to be new as compared to most recent EKG which is in 2018, so it is of unclear chronicity, she denies any chest pain or shortness of breath,  - 2D  Echo: EJF 40 to 45%. The left ventricle has mildly  decreased function. The left ventricle demonstrates global hypokinesis.  The left ventricular internal cavity size was mildly dilated. There is mild left ventricular hypertrophy. Left ventricular diastolic parameters are consistent with Grade I diastolic  dysfunction -Patient was started on low-dose metoprolol     Diabetes mellitus - Hold her glimepiride and metformin for now, and will keep on insulin sliding scale   Weakness-deconditioning -PT recommending home health services   Hyponatremia/hypokalemia -Restart IV normal saline and order potassium -Recheck in a.m.  Obesity class I BMI 31.18   DVT prophylaxis: Heparin Code Status: Full Family Communication: None at bedside Disposition Plan:  Status is: Inpatient Remains inpatient appropriate because: Need for IV fluid and IV medication  Consultants:  None  Procedures:  None  Antimicrobials:  Anti-infectives (From admission, onward)    Start     Dose/Rate Route Frequency Ordered Stop   07/30/23 1200  metroNIDAZOLE (FLAGYL) IVPB 500 mg        500 mg 100 mL/hr over 60 Minutes Intravenous Every 8 hours 07/30/23 1113     07/29/23 1445  vancomycin (VANCOCIN) capsule 125 mg        125 mg Oral 4 times daily 07/29/23 1346 08/08/23 1359   07/28/23 0000  metroNIDAZOLE (FLAGYL) IVPB 500 mg  Status:  Discontinued        500 mg 100 mL/hr over 60 Minutes Intravenous Every 12 hours 07/27/23 1845 07/29/23 1350   07/28/23 0000  cefTRIAXone (ROCEPHIN) 2 g in sodium chloride 0.9 % 100 mL IVPB  Status:  Discontinued        2 g 200  mL/hr over 30 Minutes Intravenous Every 24 hours 07/27/23 1849 07/29/23 1345   07/27/23 1715  piperacillin-tazobactam (ZOSYN) IVPB 3.375 g        3.375 g 100 mL/hr over 30 Minutes Intravenous  Once 07/27/23 1700 07/27/23 1832       Subjective: Patient seen and evaluated today with no further abdominal pain, but states that she is still having 8-10  liquidy bowel movements per night.  She would like to have something more to eat.  Objective: Vitals:   08/01/23 2157 08/02/23 0010 08/02/23 0502 08/02/23 0912  BP: 116/76 114/75 122/76 116/74  Pulse: (!) 118 77 89 (!) 116  Resp: 19 18 18    Temp: 98 F (36.7 C) (!) 97.4 F (36.3 C) 97.6 F (36.4 C)   TempSrc:  Oral Oral   SpO2: 94% 94% 95%   Weight:      Height:        Intake/Output Summary (Last 24 hours) at 08/02/2023 1044 Last data filed at 08/01/2023 1900 Gross per 24 hour  Intake 120 ml  Output --  Net 120 ml   Filed Weights   07/27/23 1130  Weight: 79.8 kg    Examination:  General exam: Appears calm and comfortable  Respiratory system: Clear to auscultation. Respiratory effort normal. Cardiovascular system: S1 & S2 heard, RRR.  Gastrointestinal system: Abdomen is soft Central nervous system: Alert and awake Extremities: No edema Skin: No significant lesions noted Psychiatry: Flat affect.    Data Reviewed: I have personally reviewed following labs and imaging studies  CBC: Recent Labs  Lab 07/29/23 0406 07/30/23 0423 07/31/23 0453 08/01/23 0506 08/02/23 0436  WBC 13.3* 16.6* 15.4* 17.0* 17.5*  NEUTROABS 12.1*  --   --   --   --   HGB 14.2 15.1* 15.3* 15.4* 14.3  HCT 44.5 47.8* 47.9* 46.8* 44.8  MCV 79.2* 78.7* 77.3* 76.6* 77.2*  PLT 257 314 314 345 312   Basic Metabolic Panel: Recent Labs  Lab 07/28/23 0646 07/29/23 0406 07/30/23 0423 07/31/23 0453 08/01/23 0506 08/02/23 0436  NA  --  134* 132* 130* 128* 127*  K  --  3.5 4.0 3.3* 3.7 3.4*  CL  --  102 101 100 100 100  CO2  --  17* 18* 17* 14* 17*  GLUCOSE  --  128* 164* 179* 199* 183*  BUN  --  34* 33* 28* 27* 26*  CREATININE  --  1.37* 1.07* 0.95 0.85 0.81  CALCIUM  --  8.4* 8.9 8.8* 8.8* 8.7*  MG 1.8 2.0  --   --   --   --   PHOS  --  3.7  --   --   --   --    GFR: Estimated Creatinine Clearance: 63.8 mL/min (by C-G formula based on SCr of 0.81 mg/dL). Liver Function  Tests: Recent Labs  Lab 07/27/23 1146  AST 38  ALT 29  ALKPHOS 66  BILITOT 0.7  PROT 7.0  ALBUMIN 3.3*   Recent Labs  Lab 07/27/23 1146  LIPASE 21   No results for input(s): "AMMONIA" in the last 168 hours. Coagulation Profile: No results for input(s): "INR", "PROTIME" in the last 168 hours. Cardiac Enzymes: No results for input(s): "CKTOTAL", "CKMB", "CKMBINDEX", "TROPONINI" in the last 168 hours. BNP (last 3 results) No results for input(s): "PROBNP" in the last 8760 hours. HbA1C: No results for input(s): "HGBA1C" in the last 72 hours. CBG: Recent Labs  Lab 08/01/23 0742 08/01/23 1153 08/01/23 1618 08/01/23 2200  08/02/23 0809  GLUCAP 239* 227* 232* 205* 185*   Lipid Profile: No results for input(s): "CHOL", "HDL", "LDLCALC", "TRIG", "CHOLHDL", "LDLDIRECT" in the last 72 hours. Thyroid Function Tests: No results for input(s): "TSH", "T4TOTAL", "FREET4", "T3FREE", "THYROIDAB" in the last 72 hours. Anemia Panel: No results for input(s): "VITAMINB12", "FOLATE", "FERRITIN", "TIBC", "IRON", "RETICCTPCT" in the last 72 hours. Sepsis Labs: Recent Labs  Lab 07/27/23 1740  PROCALCITON 1.35    Recent Results (from the past 240 hours)  C Difficile Quick Screen w PCR reflex     Status: Abnormal   Collection Time: 07/27/23  2:35 PM   Specimen: STOOL  Result Value Ref Range Status   C Diff antigen POSITIVE (A) NEGATIVE Final   C Diff toxin POSITIVE (A) NEGATIVE Final   C Diff interpretation Toxin producing C. difficile detected.  Final    Comment: CRITICAL RESULT CALLED TO, READ BACK BY AND VERIFIED WITH: K. WATTS ON 07/29/2023 @12 :47 BY T.HAMER Performed at [facility], [address], [place], [place] [address]   Blood culture (routine x 2)     Status: None   Collection Time: 07/27/23  5:29 PM   Specimen: BLOOD  Result Value Ref Range Status   Specimen Description BLOOD LEFT ANTECUBITAL  Final   Special Requests   Final    Blood Culture adequate volume BOTTLES  DRAWN AEROBIC AND ANAEROBIC   Culture   Final    NO GROWTH 5 DAYS Performed at [facility], [address] [place], [place] [address]    Report Status 08/01/2023 FINAL  Final  Blood culture (routine x 2)     Status: None   Collection Time: 07/27/23  5:40 PM   Specimen: BLOOD  Result Value Ref Range Status   Specimen Description BLOOD RIGHT ANTECUBITAL  Final   Special Requests   Final    Blood Culture adequate volume BOTTLES DRAWN AEROBIC AND ANAEROBIC   Culture   Final    NO GROWTH 5 DAYS Performed at [facility], [address], [place], [place] [address]    Report Status 08/01/2023 FINAL  Final         Radiology Studies: No results found.      Scheduled Meds:  acidophilus  2 capsule Oral TID   calcium carbonate  1 tablet Oral Q breakfast   gabapentin  100 mg Oral BID   gabapentin  300 mg Oral QHS   glimepiride  4 mg Oral Q breakfast   heparin  5,000 Units Subcutaneous Q8H   insulin aspart  0-5 Units Subcutaneous QHS   insulin aspart  0-9 Units Subcutaneous TID WC   metoprolol tartrate  12.5 mg Oral BID   potassium chloride  40 mEq Oral BID   pravastatin  20 mg Oral Daily   vancomycin  125 mg Oral QID   Continuous Infusions:  sodium chloride 100 mL/hr at 08/02/23 0920   metronidazole 500 mg (08/02/23 0402)     LOS: 6 days    Time spent: 55 minutes    Catori Panozzo Hoover Brunette, DO [facility]  If 7PM-7AM, please contact night-coverage [email or web address] 08/02/2023, 10:44 AM

## 2023-08-02 NOTE — Progress Notes (Signed)
Mobility Specialist Progress Note:    08/02/23 1355  Mobility  Activity Refused mobility   Pt refused mobility d/t already sitting in chair today. NT in room, all needs met.   Tracy Bonilla Mobility Specialist Please contact via Special educational needs teacher or  Rehab office at [phone]

## 2023-08-02 NOTE — Progress Notes (Signed)
Physical Therapy Evaluation Patient Details Name: Tracy Bonilla  History of Present Illness  Tracy Bonilla  is a 72 y.o. female, with past medical history of hypertension, diabetes mellitus type 2, umbilical hernia, hyperlipidemia, patient presents to ED secondary to complaints of diarrhea, nausea, dehydration and poor oral intake, patient reports has been having diarrhea for last 5 days, some abdominal cramping, she denies any sick contact, vomiting, but reports poor oral intake, denies any bright red blood per rectum, she denies any sick contacts, reports most recent antibiotics used was September for otitis and ear infection   Clinical Impression  Therapist entered as NT finished cleaning, pt sitting in chair upon entrance.  Pt with min cueing required for safe mechanics to push from chair vs pull on RW.  Used RW for gait per patient request as reports she feels weak today.  Pt educated on importance of  getting out of bed and walking/exercise to prevent further weakness.  2 Laps complete in room with SBA and use of RW with no LOB.  EOS pt left in chair with call bell within reach and husband in room.         If plan is discharge home, recommend the following:     Can travel by private vehicle        Equipment Recommendations    Recommendations for Other Services       Functional Status Assessment       Precautions / Restrictions Precautions Precautions: Fall Restrictions Weight Bearing Restrictions Per Provider Order: No      Mobility  Bed Mobility               General bed mobility comments: Pt in chair upon therapist entrance    Transfers Overall transfer level: Modified independent Equipment used: Rolling walker (2 wheels) Transfers: Sit to/from Stand Sit to Stand: Contact guard assist           General transfer comment: increased time, labored movement, cueing for hand placement     Ambulation/Gait Ambulation/Gait assistance: Supervision, Contact guard assist Gait Distance (Feet): 32 Feet Assistive device: Rolling walker (2 wheels)   Gait velocity: decreased     General Gait Details: slow labored cadence with moslty step-to pattern without loss of balance, limited mostly due to c/o fatigue  Stairs            Wheelchair Mobility     Tilt Bed    Modified Rankin (Stroke Patients Only)       Balance                                             Pertinent Vitals/Pain Pain Assessment Pain Assessment: No/denies pain    Home Living                          Prior Function                       Extremity/Trunk Assessment                Communication   Communication Communication: No apparent difficulties  Cognition Arousal: Alert Behavior During Therapy: WFL for tasks assessed/performed Overall Cognitive Status: Within Functional Limits for tasks assessed  General Comments      Exercises     Assessment/Plan    PT Assessment    PT Problem List         PT Treatment Interventions      PT Goals (Current goals can be found in the Care Plan section)       Frequency       Co-evaluation               AM-PAC PT "6 Clicks" Mobility  Outcome Measure Help needed turning from your back to your side while in a flat bed without using bedrails?: None Help needed moving from lying on your back to sitting on the side of a flat bed without using bedrails?: A Little Help needed moving to and from a bed to a chair (including a wheelchair)?: A Little Help needed standing up from a chair using your arms (e.g., wheelchair or bedside chair)?: A Little Help needed to walk in hospital room?: A Little Help needed climbing 3-5 steps with a railing? : A Little 6 Click Score: 19    End of Session Equipment Utilized During Treatment: Gait  belt Activity Tolerance: Patient tolerated treatment well;Patient limited by fatigue Patient left: in chair;with call bell/phone within reach;with family/visitor present Nurse Communication: Mobility status PT Visit Diagnosis: Unsteadiness on feet (R26.81);Other abnormalities of gait and mobility (R26.89);Muscle weakness (generalized) (M62.81)    Time: 5427-0623 PT Time Calculation (min) (ACUTE ONLY): 23 min   Charges:     PT Treatments $Therapeutic Activity: 23-37 mins PT General Charges $$ ACUTE PT VISIT: 1 Visit         Becky Sax, LPTA/CLT; CBIS [phone]  Juel Burrow Bonilla, 4:24 PM

## 2023-08-03 DIAGNOSIS — K529 Noninfective gastroenteritis and colitis, unspecified: Secondary | ICD-10-CM | POA: Diagnosis not present

## 2023-08-03 LAB — GLUCOSE, CAPILLARY
Glucose-Capillary: 279 mg/dL — ABNORMAL HIGH (ref 70–99)
Glucose-Capillary: 192 mg/dL — ABNORMAL HIGH (ref 70–99)
Glucose-Capillary: 300 mg/dL — ABNORMAL HIGH (ref 70–99)
Glucose-Capillary: 215 mg/dL — ABNORMAL HIGH (ref 70–99)
Glucose-Capillary: 164 mg/dL — ABNORMAL HIGH (ref 70–99)

## 2023-08-03 LAB — BASIC METABOLIC PANEL
Anion gap: 9 (ref 5–15)
BUN: 20 mg/dL (ref 8–23)
CO2: 19 mmol/L — ABNORMAL LOW (ref 22–32)
Calcium: 8.8 mg/dL — ABNORMAL LOW (ref 8.9–10.3)
Chloride: 98 mmol/L (ref 98–111)
Creatinine, Ser: 0.72 mg/dL (ref 0.44–1.00)
GFR, Estimated: >60 mL/min (ref >60)
Glucose, Bld: 202 mg/dL — ABNORMAL HIGH (ref 70–99)
Potassium: 3.9 mmol/L (ref 3.5–5.1)
Sodium: 126 mmol/L — ABNORMAL LOW (ref 135–145)

## 2023-08-03 LAB — CBC
HCT: 43.4 % (ref 36.0–46.0)
Hemoglobin: 14 g/dL (ref 12.0–15.0)
MCH: 24.9 pg — ABNORMAL LOW (ref 26.0–34.0)
MCHC: 32.3 g/dL (ref 30.0–36.0)
MCV: 77.2 fL — ABNORMAL LOW (ref 80.0–100.0)
Platelets: 314 10*3/uL (ref 150–400)
RBC: 5.62 MIL/uL — ABNORMAL HIGH (ref 3.87–5.11)
RDW: 16.1 % — ABNORMAL HIGH (ref 11.5–15.5)
WBC: 19.2 10*3/uL — ABNORMAL HIGH (ref 4.0–10.5)
nRBC: 0 % (ref 0.0–0.2)

## 2023-08-03 LAB — MAGNESIUM: Magnesium: 2.4 mg/dL (ref 1.7–2.4)

## 2023-08-03 MED ORDER — INSULIN GLARGINE-YFGN 100 UNIT/ML ~~LOC~~ SOLN
10.0000 [IU] | Freq: Every day | SUBCUTANEOUS | Status: DC
Start: 2023-08-03 — End: 2023-08-06
  Administered 2023-08-03 – 2023-08-06 (×4): 10 [IU] via SUBCUTANEOUS
  Filled 2023-08-03 (×6): qty 0.1

## 2023-08-03 MED ORDER — GLIMEPIRIDE 2 MG PO TABS
4.0000 mg | ORAL_TABLET | Freq: Two times a day (BID) | ORAL | Status: DC
  Administered 2023-08-03 – 2023-08-06 (×6): 4 mg via ORAL
  Filled 2023-08-03 (×6): qty 2

## 2023-08-03 MED ORDER — INSULIN ASPART 100 UNIT/ML IJ SOLN
3.0000 [IU] | Freq: Three times a day (TID) | INTRAMUSCULAR | Status: DC
  Administered 2023-08-03 – 2023-08-05 (×6): 3 [IU] via SUBCUTANEOUS

## 2023-08-03 MED ORDER — SODIUM CHLORIDE 0.9 % IV SOLN: INTRAVENOUS | Status: AC

## 2023-08-03 NOTE — Care Management Important Message (Signed)
Important Message  Patient Details  Name: Tracy Bonilla MRN: [id number] Date of Birth: Dec 24, 1950   Important Message Given:  Yes - Medicare IM (reviewed letter by phone, no additional copy needed)     Corey Harold 08/03/2023, 12:50 PM

## 2023-08-03 NOTE — Progress Notes (Signed)
   08/03/23 2141  Assess: MEWS Score  Temp 97.6 F (36.4 C)  BP 95/75  MAP (mmHg) 83  Pulse Rate (!) 110  Resp 20  SpO2 96 %  O2 Device Room Air  Assess: MEWS Score  MEWS Temp 0  MEWS Systolic 1  MEWS Pulse 1  MEWS RR 0  MEWS LOC 0  MEWS Score 2  MEWS Score Color Yellow  Assess: if the MEWS score is Yellow or Red  Were vital signs accurate and taken at a resting state? Yes  Does the patient meet 2 or more of the SIRS criteria? No  MEWS guidelines implemented  No, previously yellow, continue vital signs every 4 hours  Notify: Charge Nurse/RN  Name of Charge Nurse/RN Notified Rosalyn Charters  Provider Notification  Provider Name/Title Dr. Thomes Dinning  Date Provider Notified 08/03/23  Time Provider Notified 2145  Method of Notification Page (yellow mews)  Notification Reason Other (Comment) (yellow mews)  Assess: SIRS CRITERIA  SIRS Temperature  0  SIRS Respirations  0  SIRS Pulse 1  SIRS WBC 0  SIRS Score Sum  1

## 2023-08-03 NOTE — Progress Notes (Signed)
PROGRESS NOTE    Tracy Bonilla  WGN:[id number] DOB: 04/20/1951 DOA: 07/27/2023 PCP: Elfredia Nevins, MD   Brief Narrative:  Tracy Bonilla  is a 72 y.o. female, with past medical history of hypertension, diabetes mellitus type 2, umbilical hernia, hyperlipidemia, patient presents to ED secondary to complaints of diarrhea, nausea, dehydration and poor oral intake, patient reports has been having diarrhea for last 5 days, some abdominal cramping, she denies any sick contact, vomiting, but reports poor oral intake, denies any bright red blood per rectum, she denies any sick contacts, reports most recent antibiotics used was September for otitis and ear infection -In ED labs significant for sodium 129, glucose 214, creatinine 2.2 with BUN of 30, her white blood cell count elevated at 18.5, CT abdomen pelvis was obtained significant for pancolitis, [facility] hospitalist consulted to admit.     Assessment & Plan:   Principal Problem:   Colitis Active Problems:   Essential hypertension   Type 2 diabetes mellitus (HCC)  Assessment and Plan:     C. difficile colitis Diarrhea/colitis -Continues to have watery diarrhea, 8-10 episodes overnight Nonbloody -diarrhea, reducing frequency Encouraging p.o. intake-tolerated -POA: Watery diarrhea with abdominal pain, poor appetite -Continue IV Flagyl and p.o. vancomycin -Frequency of diarrhea is starting to improve, but slowly   AKI-improved -Likely due to dehydration, monitoring closely -Improved with IV fluids   -Avoid nephrotoxins Holding metformin, lisinopril and HCTZ     Hypertension-mildly hypertensive -  holding home medication of HCTZ and lisinopril-due to dehydration, AKI -Continue metoprolol for now given her sinus tachycardia   Sinus tachycardia Left bundle branch block -Still mildly tacky at rate 102 -EKG with  left bundle branch block, appears to be new as compared to most recent EKG which is in 2018, so it is of unclear chronicity,  she denies any chest pain or shortness of breath,  - 2D Echo: EJF 40 to 45%. The left ventricle has mildly  decreased function. The left ventricle demonstrates global hypokinesis.  The left ventricular internal cavity size was mildly dilated. There is mild left ventricular hypertrophy. Left ventricular diastolic parameters are consistent with Grade I diastolic  dysfunction -Patient was started on low-dose metoprolol     Diabetes mellitus with hyperglycemia -Restart Amaryl and add mealtime insulin as well as Semglee daily   Weakness-deconditioning -PT recommending home health services   Hyponatremia -Continue IV normal saline -Recheck in a.m.  Obesity class I BMI 31.18   DVT prophylaxis: Heparin Code Status: Full Family Communication: None at bedside Disposition Plan:  Status is: Inpatient Remains inpatient appropriate because: Need for IV fluid and IV medication  Consultants:  None  Procedures:  None  Antimicrobials:  Anti-infectives (From admission, onward)    Start     Dose/Rate Route Frequency Ordered Stop   07/30/23 1200  metroNIDAZOLE (FLAGYL) IVPB 500 mg        500 mg 100 mL/hr over 60 Minutes Intravenous Every 8 hours 07/30/23 1113     07/29/23 1445  vancomycin (VANCOCIN) capsule 125 mg        125 mg Oral 4 times daily 07/29/23 1346 08/08/23 1359   07/28/23 0000  metroNIDAZOLE (FLAGYL) IVPB 500 mg  Status:  Discontinued        500 mg 100 mL/hr over 60 Minutes Intravenous Every 12 hours 07/27/23 1845 07/29/23 1350   07/28/23 0000  cefTRIAXone (ROCEPHIN) 2 g in sodium chloride 0.9 % 100 mL IVPB  Status:  Discontinued        2  g 200 mL/hr over 30 Minutes Intravenous Every 24 hours 07/27/23 1849 07/29/23 1345   07/27/23 1715  piperacillin-tazobactam (ZOSYN) IVPB 3.375 g        3.375 g 100 mL/hr over 30 Minutes Intravenous  Once 07/27/23 1700 07/27/23 1832       Subjective: Patient seen and evaluated today with no further abdominal pain, but states that she  is still having 6-7 liquidy bowel movements per night.  She is overall slowly starting to feel better.  Objective: Vitals:   08/02/23 0912 08/02/23 1319 08/02/23 1351 08/02/23 2118  BP: 116/74 124/75 (!) 125/98 124/78  Pulse: (!) 116 83 90 87  Resp:  17 18 20   Temp:  97.7 F (36.5 C) 97.9 F (36.6 C) (!) 97.5 F (36.4 C)  TempSrc:   Oral Oral  SpO2:  95% 99% 94%  Weight:      Height:        Intake/Output Summary (Last 24 hours) at 08/03/2023 1126 Last data filed at 08/02/2023 1830 Gross per 24 hour  Intake 945.51 ml  Output --  Net 945.51 ml   Filed Weights   07/27/23 1130  Weight: 79.8 kg    Examination:  General exam: Appears calm and comfortable  Respiratory system: Clear to auscultation. Respiratory effort normal. Cardiovascular system: S1 & S2 heard, RRR.  Gastrointestinal system: Abdomen is soft Central nervous system: Alert and awake Extremities: No edema Skin: No significant lesions noted Psychiatry: Flat affect.    Data Reviewed: I have personally reviewed following labs and imaging studies  CBC: Recent Labs  Lab 07/29/23 0406 07/30/23 0423 07/31/23 0453 08/01/23 0506 08/02/23 0436 08/03/23 0417  WBC 13.3* 16.6* 15.4* 17.0* 17.5* 19.2*  NEUTROABS 12.1*  --   --   --   --   --   HGB 14.2 15.1* 15.3* 15.4* 14.3 14.0  HCT 44.5 47.8* 47.9* 46.8* 44.8 43.4  MCV 79.2* 78.7* 77.3* 76.6* 77.2* 77.2*  PLT 257 314 314 345 312 314   Basic Metabolic Panel: Recent Labs  Lab 07/28/23 0646 07/29/23 0406 07/30/23 0423 07/31/23 0453 08/01/23 0506 08/02/23 0436 08/03/23 0417  NA  --  134* 132* 130* 128* 127* 126*  K  --  3.5 4.0 3.3* 3.7 3.4* 3.9  CL  --  102 101 100 100 100 98  CO2  --  17* 18* 17* 14* 17* 19*  GLUCOSE  --  128* 164* 179* 199* 183* 202*  BUN  --  34* 33* 28* 27* 26* 20  CREATININE  --  1.37* 1.07* 0.95 0.85 0.81 0.72  CALCIUM  --  8.4* 8.9 8.8* 8.8* 8.7* 8.8*  MG 1.8 2.0  --   --   --   --  2.4  PHOS  --  3.7  --   --   --   --    --    GFR: Estimated Creatinine Clearance: 64.6 mL/min (by C-G formula based on SCr of 0.72 mg/dL). Liver Function Tests: Recent Labs  Lab 07/27/23 1146  AST 38  ALT 29  ALKPHOS 66  BILITOT 0.7  PROT 7.0  ALBUMIN 3.3*   Recent Labs  Lab 07/27/23 1146  LIPASE 21   No results for input(s): "AMMONIA" in the last 168 hours. Coagulation Profile: No results for input(s): "INR", "PROTIME" in the last 168 hours. Cardiac Enzymes: No results for input(s): "CKTOTAL", "CKMB", "CKMBINDEX", "TROPONINI" in the last 168 hours. BNP (last 3 results) No results for input(s): "PROBNP" in the last 8760  hours. HbA1C: No results for input(s): "HGBA1C" in the last 72 hours. CBG: Recent Labs  Lab 08/02/23 1214 08/02/23 1638 08/02/23 2120 08/03/23 0747 08/03/23 1109  GLUCAP 219* 299* 279* 192* 300*   Lipid Profile: No results for input(s): "CHOL", "HDL", "LDLCALC", "TRIG", "CHOLHDL", "LDLDIRECT" in the last 72 hours. Thyroid Function Tests: No results for input(s): "TSH", "T4TOTAL", "FREET4", "T3FREE", "THYROIDAB" in the last 72 hours. Anemia Panel: No results for input(s): "VITAMINB12", "FOLATE", "FERRITIN", "TIBC", "IRON", "RETICCTPCT" in the last 72 hours. Sepsis Labs: Recent Labs  Lab 07/27/23 1740  PROCALCITON 1.35    Recent Results (from the past 240 hours)  C Difficile Quick Screen w PCR reflex     Status: Abnormal   Collection Time: 07/27/23  2:35 PM   Specimen: STOOL  Result Value Ref Range Status   C Diff antigen POSITIVE (A) NEGATIVE Final   C Diff toxin POSITIVE (A) NEGATIVE Final   C Diff interpretation Toxin producing C. difficile detected.  Final    Comment: CRITICAL RESULT CALLED TO, READ BACK BY AND VERIFIED WITH: K. WATTS ON 07/29/2023 @12 :47 BY T.HAMER Performed at [facility], [address], [place], [place] [address]   Blood culture (routine x 2)     Status: None   Collection Time: 07/27/23  5:29 PM   Specimen: BLOOD  Result Value Ref Range Status    Specimen Description BLOOD LEFT ANTECUBITAL  Final   Special Requests   Final    Blood Culture adequate volume BOTTLES DRAWN AEROBIC AND ANAEROBIC   Culture   Final    NO GROWTH 5 DAYS Performed at [facility], [address], [place], [place] [address]    Report Status 08/01/2023 FINAL  Final  Blood culture (routine x 2)     Status: None   Collection Time: 07/27/23  5:40 PM   Specimen: BLOOD  Result Value Ref Range Status   Specimen Description BLOOD RIGHT ANTECUBITAL  Final   Special Requests   Final    Blood Culture adequate volume BOTTLES DRAWN AEROBIC AND ANAEROBIC   Culture   Final    NO GROWTH 5 DAYS Performed at [facility], [address] [place], [place] [address]    Report Status 08/01/2023 FINAL  Final         Radiology Studies: No results found.      Scheduled Meds:  acidophilus  2 capsule Oral TID   calcium carbonate  1 tablet Oral Q breakfast   gabapentin  100 mg Oral BID   gabapentin  300 mg Oral QHS   glimepiride  4 mg Oral Q breakfast   glimepiride  4 mg Oral BID   heparin  5,000 Units Subcutaneous Q8H   insulin aspart  0-5 Units Subcutaneous QHS   insulin aspart  0-9 Units Subcutaneous TID WC   insulin aspart  3 Units Subcutaneous TID WC   insulin glargine-yfgn  10 Units Subcutaneous Daily   metoprolol tartrate  12.5 mg Oral BID   pravastatin  20 mg Oral Daily   vancomycin  125 mg Oral QID   Continuous Infusions:  sodium chloride 100 mL/hr at 08/03/23 1059   metronidazole 500 mg (08/03/23 0412)     LOS: 7 days    Time spent: 35 minutes    Ellijah Leffel Hoover Brunette, DO [facility]  If 7PM-7AM, please contact night-coverage [email or web address] 08/03/2023, 11:26 AM

## 2023-08-03 NOTE — Progress Notes (Signed)
Physical Therapy Treatment Patient Details Name: Tracy Bonilla MRN: [id number] DOB: [birth year] Today's Date: 08/03/2023   History of Present Illness Tracy Bonilla  is a 72 y.o. female, with past medical history of hypertension, diabetes mellitus type 2, umbilical hernia, hyperlipidemia, patient presents to ED secondary to complaints of diarrhea, nausea, dehydration and poor oral intake, patient reports has been having diarrhea for last 5 days, some abdominal cramping, she denies any sick contact, vomiting, but reports poor oral intake, denies any bright red blood per rectum, she denies any sick contacts, reports most recent antibiotics used was September for otitis and ear infection    PT Comments  Patient lying in bed on therapist arrival.  Agreeable to session.  PT guides patient in lower extremity therapeutic exercises to include supine ankle pumps, heels slides and seated LAQ's.   Patient performs supine to sit with Supervision and sit to stand to RW with CGA/SBA.  She is able to walk in the room with RW and CGA/SBA; slow labored gait speed.  Noted general weakness but no compliant of pain throughout treatment.  PT assists patient back to bed.  patient left in bed with call button in reach and nursing notified of mobility status. Patient will benefit from continued skilled therapy services during the remainder of her hospital stay and at the next recommended venue of care to address deficits and promote return to optimal function.        If plan is discharge home, recommend the following: A little help with walking and/or transfers;A little help with bathing/dressing/bathroom;Help with stairs or ramp for entrance;Assistance with cooking/housework   Can travel by private vehicle        Equipment Recommendations  None recommended by PT    Recommendations for Other Services       Precautions / Restrictions Precautions Precautions: Fall Restrictions Weight Bearing Restrictions Per Provider  Order: No     Mobility  Bed Mobility Overal bed mobility: Needs Assistance Bed Mobility: Sit to Supine       Sit to supine: Modified independent (Device/Increase time), Supervision   General bed mobility comments: patient lying in bed on therapist arrival Patient Response: Cooperative  Transfers Overall transfer level: Modified independent Equipment used: Rolling walker (2 wheels) Transfers: Sit to/from Stand Sit to Stand: Contact guard assist   Step pivot transfers: Supervision, Contact guard assist       General transfer comment: increased time, labored movement, cueing for hand placement    Ambulation/Gait Ambulation/Gait assistance: Supervision, Contact guard assist Gait Distance (Feet): 20 Feet Assistive device: Rolling walker (2 wheels) Gait Pattern/deviations: Step-to pattern, Decreased step length - right, Decreased step length - left, Decreased stride length Gait velocity: decreased     General Gait Details: slow labored cadence with moslty step-to pattern without loss of balance, limited mostly due to c/o fatigue   Stairs             Wheelchair Mobility     Tilt Bed Tilt Bed Patient Response: Cooperative  Modified Rankin (Stroke Patients Only)       Balance Overall balance assessment: Needs assistance Sitting-balance support: Feet supported, No upper extremity supported Sitting balance-Leahy Scale: Good Sitting balance - Comments: seated at EOB   Standing balance support: During functional activity, Single extremity supported Standing balance-Leahy Scale: Fair Standing balance comment: fair/good using SPC  Cognition Arousal: Alert Behavior During Therapy: WFL for tasks assessed/performed Overall Cognitive Status: Within Functional Limits for tasks assessed                                          Exercises Other Exercises Other Exercises: supine ankle pumps, heel slides x 10  each; seated LAQ's x 10 each    General Comments        Pertinent Vitals/Pain Pain Assessment Pain Assessment: No/denies pain    Home Living                          Prior Function            PT Goals (current goals can now be found in the care plan section) Acute Rehab PT Goals Patient Stated Goal: return home with family to assist PT Goal Formulation: With patient/family Time For Goal Achievement: 08/03/23 Potential to Achieve Goals: Good Progress towards PT goals: Progressing toward goals    Frequency    Min 3X/week      PT Plan      Co-evaluation              AM-PAC PT "6 Clicks" Mobility   Outcome Measure  Help needed turning from your back to your side while in a flat bed without using bedrails?: None Help needed moving from lying on your back to sitting on the side of a flat bed without using bedrails?: A Little Help needed moving to and from a bed to a chair (including a wheelchair)?: A Little Help needed standing up from a chair using your arms (e.g., wheelchair or bedside chair)?: A Little Help needed to walk in hospital room?: A Little Help needed climbing 3-5 steps with a railing? : A Little 6 Click Score: 19    End of Session   Activity Tolerance: Patient tolerated treatment well;Patient limited by fatigue Patient left: in bed;with call bell/phone within reach Nurse Communication: Mobility status PT Visit Diagnosis: Unsteadiness on feet (R26.81);Other abnormalities of gait and mobility (R26.89);Muscle weakness (generalized) (M62.81)     Time: 1610-9604 PT Time Calculation (min) (ACUTE ONLY): 18 min  Charges:    $Therapeutic Exercise: 8-22 mins PT General Charges $$ ACUTE PT VISIT: 1 Visit                     12:18 PM, 08/03/23 Sanda Dejoy Small Armondo Cech MPT [facility] [place] [phone] [phone]

## 2023-08-03 NOTE — Progress Notes (Signed)
Mobility Specialist Progress Note:    08/03/23 1030  Mobility  Activity Ambulated with assistance to bathroom  Level of Assistance Contact guard assist, steadying assist  Assistive Device Front wheel walker  Distance Ambulated (ft) 15 ft  Range of Motion/Exercises Active;All extremities  Activity Response Tolerated well  Mobility Referral Yes  Mobility visit 1 Mobility  Mobility Specialist Start Time (ACUTE ONLY) 1005  Mobility Specialist Stop Time (ACUTE ONLY) 1015  Mobility Specialist Time Calculation (min) (ACUTE ONLY) 10 min   Pt received in bed, requesting assistance to bathroom. Required CGA to stand and ambulate with RW. Tolerated well, asx throughout. Left pt in bathroom, notified NT. All needs met.   Lawerance Bach Mobility Specialist Please contact via Special educational needs teacher or  Rehab office at [phone]

## 2023-08-03 NOTE — Inpatient Diabetes Management (Signed)
Inpatient Diabetes Program Recommendations  AACE/ADA: New Consensus Statement on Inpatient Glycemic Control (2015)  Target Ranges:  Prepandial:   less than 140 mg/dL      Peak postprandial:   less than 180 mg/dL (1-2 hours)      Critically ill patients:  140 - 180 mg/dL   Lab Results  Component Value Date   GLUCAP 300 (H) 08/03/2023   HGBA1C 7.2 (H) 07/27/2023    Review of Glycemic Control  Latest Reference Range & Units 08/02/23 08:09 08/02/23 12:14 08/02/23 16:38 08/02/23 21:20 08/03/23 07:47 08/03/23 11:09  Glucose-Capillary 70 - 99 mg/dL 161 (H) 096 (H) 045 (H) 279 (H) 192 (H) 300 (H)   Diabetes history: DM 2 Outpatient Diabetes medications:  Amaryl 4 mg bid Metformin-stopped due to diarrhea Current orders for Inpatient glycemic control:  Novolog 0-9 units tid with meals and HS Amaryl 4 mg daily  Inpatient Diabetes Program Recommendations:    Consider holding Amaryl while patient is in the hospital.  Consider adding Semglee 10 units daily and add Novolog meal coverage 3 units tid with meals (hold if patient eats less than 50% or NPO).   Thanks,  Beryl Meager, RN, BC-ADM Inpatient Diabetes Coordinator Pager [phone]  (8a-5p)

## 2023-08-04 DIAGNOSIS — K529 Noninfective gastroenteritis and colitis, unspecified: Secondary | ICD-10-CM | POA: Diagnosis not present

## 2023-08-04 LAB — MAGNESIUM: Magnesium: 2 mg/dL (ref 1.7–2.4)

## 2023-08-04 LAB — CBC
HCT: 40.4 % (ref 36.0–46.0)
Hemoglobin: 13.2 g/dL (ref 12.0–15.0)
MCH: 25.2 pg — ABNORMAL LOW (ref 26.0–34.0)
MCHC: 32.7 g/dL (ref 30.0–36.0)
MCV: 77.2 fL — ABNORMAL LOW (ref 80.0–100.0)
Platelets: 277 10*3/uL (ref 150–400)
RBC: 5.23 MIL/uL — ABNORMAL HIGH (ref 3.87–5.11)
RDW: 16.2 % — ABNORMAL HIGH (ref 11.5–15.5)
WBC: 17.5 10*3/uL — ABNORMAL HIGH (ref 4.0–10.5)
nRBC: 0 % (ref 0.0–0.2)

## 2023-08-04 LAB — BASIC METABOLIC PANEL WITH GFR
Anion gap: 7 (ref 5–15)
BUN: 12 mg/dL (ref 8–23)
CO2: 20 mmol/L — ABNORMAL LOW (ref 22–32)
Calcium: 8.4 mg/dL — ABNORMAL LOW (ref 8.9–10.3)
Chloride: 102 mmol/L (ref 98–111)
Creatinine, Ser: 0.57 mg/dL (ref 0.44–1.00)
GFR, Estimated: >60 mL/min
Glucose, Bld: 118 mg/dL — ABNORMAL HIGH (ref 70–99)
Potassium: 3.4 mmol/L — ABNORMAL LOW (ref 3.5–5.1)
Sodium: 129 mmol/L — ABNORMAL LOW (ref 135–145)

## 2023-08-04 LAB — GLUCOSE, CAPILLARY
Glucose-Capillary: 102 mg/dL — ABNORMAL HIGH (ref 70–99)
Glucose-Capillary: 139 mg/dL — ABNORMAL HIGH (ref 70–99)
Glucose-Capillary: 100 mg/dL — ABNORMAL HIGH (ref 70–99)
Glucose-Capillary: 140 mg/dL — ABNORMAL HIGH (ref 70–99)

## 2023-08-04 MED ORDER — POTASSIUM CHLORIDE CRYS ER 20 MEQ PO TBCR
40.0000 meq | EXTENDED_RELEASE_TABLET | Freq: Two times a day (BID) | ORAL | Status: AC
  Administered 2023-08-04 (×2): 40 meq via ORAL
  Filled 2023-08-04 (×2): qty 2

## 2023-08-04 MED ORDER — GUAIFENESIN-DM 100-10 MG/5ML PO SYRP
5.0000 mL | ORAL_SOLUTION | ORAL | Status: DC | PRN
  Administered 2023-08-04 – 2023-08-06 (×6): 5 mL via ORAL
  Filled 2023-08-04 (×7): qty 5

## 2023-08-04 NOTE — Plan of Care (Signed)

## 2023-08-04 NOTE — Progress Notes (Signed)
PROGRESS NOTE    Tracy Bonilla  YNW:[id number] DOB: 10/16/1950 DOA: 07/27/2023 PCP: Elfredia Nevins, MD   Brief Narrative:  Tracy Bonilla  is a 72 y.o. female, with past medical history of hypertension, diabetes mellitus type 2, umbilical hernia, hyperlipidemia, patient presents to ED secondary to complaints of diarrhea, nausea, dehydration and poor oral intake, patient reports has been having diarrhea for last 5 days, some abdominal cramping, she denies any sick contact, vomiting, but reports poor oral intake, denies any bright red blood per rectum, she denies any sick contacts, reports most recent antibiotics used was September for otitis and ear infection -In ED labs significant for sodium 129, glucose 214, creatinine 2.2 with BUN of 30, her white blood cell count elevated at 18.5, CT abdomen pelvis was obtained significant for pancolitis, [facility]ist consulted to admit.     Assessment & Plan:   Principal Problem:   Colitis Active Problems:   Essential hypertension   Type 2 diabetes mellitus (HCC)  Assessment and Plan:     C. difficile colitis Diarrhea/colitis -Continues to have watery diarrhea, 8-10 episodes overnight Nonbloody -diarrhea, reducing frequency Encouraging p.o. intake-tolerated -POA: Watery diarrhea with abdominal pain, poor appetite -Continue IV Flagyl and p.o. vancomycin -Frequency of diarrhea is starting to improve, but slowly   AKI-improved -Likely due to dehydration, monitoring closely -Improved with IV fluids   -Avoid nephrotoxins Holding metformin, lisinopril and HCTZ     Hypertension-mildly hypertensive -  holding home medication of HCTZ and lisinopril-due to dehydration, AKI -Continue metoprolol for now given her sinus tachycardia   Sinus tachycardia Left bundle branch block -Still mildly tacky at rate 102 -EKG with  left bundle branch block, appears to be new as compared to most recent EKG which is in 2018, so it is of unclear chronicity,  she denies any chest pain or shortness of breath,  - 2D Echo: EJF 40 to 45%. The left ventricle has mildly  decreased function. The left ventricle demonstrates global hypokinesis.  The left ventricular internal cavity size was mildly dilated. There is mild left ventricular hypertrophy. Left ventricular diastolic parameters are consistent with Grade I diastolic  dysfunction -Patient was started on low-dose metoprolol     Diabetes mellitus with hyperglycemia -Restart Amaryl and add mealtime insulin as well as Semglee daily   Weakness-deconditioning -PT recommending home health services   Hyponatremia -Continue IV normal saline -Recheck in a.m.  Obesity class I BMI 31.18   DVT prophylaxis: Heparin Code Status: Full Family Communication: None at bedside Disposition Plan:  Status is: Inpatient Remains inpatient appropriate because: Need for IV fluid and IV medication  Consultants:  None  Procedures:  None  Antimicrobials:  Anti-infectives (From admission, onward)    Start     Dose/Rate Route Frequency Ordered Stop   07/30/23 1200  metroNIDAZOLE (FLAGYL) IVPB 500 mg        500 mg 100 mL/hr over 60 Minutes Intravenous Every 8 hours 07/30/23 1113     07/29/23 1445  vancomycin (VANCOCIN) capsule 125 mg        125 mg Oral 4 times daily 07/29/23 1346 08/08/23 1359   07/28/23 0000  metroNIDAZOLE (FLAGYL) IVPB 500 mg  Status:  Discontinued        500 mg 100 mL/hr over 60 Minutes Intravenous Every 12 hours 07/27/23 1845 07/29/23 1350   07/28/23 0000  cefTRIAXone (ROCEPHIN) 2 g in sodium chloride 0.9 % 100 mL IVPB  Status:  Discontinued        2  g 200 mL/hr over 30 Minutes Intravenous Every 24 hours 07/27/23 1849 07/29/23 1345   07/27/23 1715  piperacillin-tazobactam (ZOSYN) IVPB 3.375 g        3.375 g 100 mL/hr over 30 Minutes Intravenous  Once 07/27/23 1700 07/27/23 1832       Subjective: Patient seen and evaluated today with no further abdominal pain, but states that she  is still having 6-7 liquidy bowel movements per night.  She is overall slowly starting to feel better.  Objective: Vitals:   08/03/23 1505 08/03/23 2141 08/03/23 2226 08/04/23 0631  BP: 113/71 95/75 117/77 128/77  Pulse: (!) 105 (!) 110 (!) 110 (!) 108  Resp: 19 20 16 16   Temp: 98.2 F (36.8 C) 97.6 F (36.4 C)  97.7 F (36.5 C)  TempSrc: Oral Oral  Oral  SpO2: 96% 96% 98% 96%  Weight:      Height:        Intake/Output Summary (Last 24 hours) at 08/04/2023 1209 Last data filed at 08/04/2023 0859 Gross per 24 hour  Intake 1531.92 ml  Output --  Net 1531.92 ml   Filed Weights   07/27/23 1130  Weight: 79.8 kg    Examination:  General exam: Appears calm and comfortable  Respiratory system: Clear to auscultation. Respiratory effort normal. Cardiovascular system: S1 & S2 heard, RRR.  Gastrointestinal system: Abdomen is soft Central nervous system: Alert and awake Extremities: No edema Skin: No significant lesions noted Psychiatry: Flat affect.    Data Reviewed: I have personally reviewed following labs and imaging studies  CBC: Recent Labs  Lab 07/29/23 0406 07/30/23 0423 07/31/23 0453 08/01/23 0506 08/02/23 0436 08/03/23 0417 08/04/23 0433  WBC 13.3*   < > 15.4* 17.0* 17.5* 19.2* 17.5*  NEUTROABS 12.1*  --   --   --   --   --   --   HGB 14.2   < > 15.3* 15.4* 14.3 14.0 13.2  HCT 44.5   < > 47.9* 46.8* 44.8 43.4 40.4  MCV 79.2*   < > 77.3* 76.6* 77.2* 77.2* 77.2*  PLT 257   < > 314 345 312 314 277   < > = values in this interval not displayed.   Basic Metabolic Panel: Recent Labs  Lab 07/29/23 0406 07/30/23 0423 07/31/23 0453 08/01/23 0506 08/02/23 0436 08/03/23 0417 08/04/23 0433  NA 134*   < > 130* 128* 127* 126* 129*  K 3.5   < > 3.3* 3.7 3.4* 3.9 3.4*  CL 102   < > 100 100 100 98 102  CO2 17*   < > 17* 14* 17* 19* 20*  GLUCOSE 128*   < > 179* 199* 183* 202* 118*  BUN 34*   < > 28* 27* 26* 20 12  CREATININE 1.37*   < > 0.95 0.85 0.81 0.72  0.57  CALCIUM 8.4*   < > 8.8* 8.8* 8.7* 8.8* 8.4*  MG 2.0  --   --   --   --  2.4 2.0  PHOS 3.7  --   --   --   --   --   --    < > = values in this interval not displayed.   GFR: Estimated Creatinine Clearance: 64.6 mL/min (by C-G formula based on SCr of 0.57 mg/dL). Liver Function Tests: No results for input(s): "AST", "ALT", "ALKPHOS", "BILITOT", "PROT", "ALBUMIN" in the last 168 hours.  No results for input(s): "LIPASE", "AMYLASE" in the last 168 hours.  No results for input(s): "  AMMONIA" in the last 168 hours. Coagulation Profile: No results for input(s): "INR", "PROTIME" in the last 168 hours. Cardiac Enzymes: No results for input(s): "CKTOTAL", "CKMB", "CKMBINDEX", "TROPONINI" in the last 168 hours. BNP (last 3 results) No results for input(s): "PROBNP" in the last 8760 hours. HbA1C: No results for input(s): "HGBA1C" in the last 72 hours. CBG: Recent Labs  Lab 08/03/23 1109 08/03/23 1627 08/03/23 2137 08/04/23 0728 08/04/23 1122  GLUCAP 300* 215* 164* 102* 139*   Lipid Profile: No results for input(s): "CHOL", "HDL", "LDLCALC", "TRIG", "CHOLHDL", "LDLDIRECT" in the last 72 hours. Thyroid Function Tests: No results for input(s): "TSH", "T4TOTAL", "FREET4", "T3FREE", "THYROIDAB" in the last 72 hours. Anemia Panel: No results for input(s): "VITAMINB12", "FOLATE", "FERRITIN", "TIBC", "IRON", "RETICCTPCT" in the last 72 hours. Sepsis Labs: No results for input(s): "PROCALCITON", "LATICACIDVEN" in the last 168 hours.   Recent Results (from the past 240 hours)  C Difficile Quick Screen w PCR reflex     Status: Abnormal   Collection Time: 07/27/23  2:35 PM   Specimen: STOOL  Result Value Ref Range Status   C Diff antigen POSITIVE (A) NEGATIVE Final   C Diff toxin POSITIVE (A) NEGATIVE Final   C Diff interpretation Toxin producing C. difficile detected.  Final    Comment: CRITICAL RESULT CALLED TO, READ BACK BY AND VERIFIED WITH: K. WATTS ON 07/29/2023 @12 :47 BY  T.HAMER Performed at [facility], [address], [place], [place] [address]   Blood culture (routine x 2)     Status: None   Collection Time: 07/27/23  5:29 PM   Specimen: BLOOD  Result Value Ref Range Status   Specimen Description BLOOD LEFT ANTECUBITAL  Final   Special Requests   Final    Blood Culture adequate volume BOTTLES DRAWN AEROBIC AND ANAEROBIC   Culture   Final    NO GROWTH 5 DAYS Performed at [facility], [address] [place], [place] [address]    Report Status 08/01/2023 FINAL  Final  Blood culture (routine x 2)     Status: None   Collection Time: 07/27/23  5:40 PM   Specimen: BLOOD  Result Value Ref Range Status   Specimen Description BLOOD RIGHT ANTECUBITAL  Final   Special Requests   Final    Blood Culture adequate volume BOTTLES DRAWN AEROBIC AND ANAEROBIC   Culture   Final    NO GROWTH 5 DAYS Performed at [facility], [address], [place], [place] [address]    Report Status 08/01/2023 FINAL  Final         Radiology Studies: No results found.      Scheduled Meds:  acidophilus  2 capsule Oral TID   calcium carbonate  1 tablet Oral Q breakfast   gabapentin  100 mg Oral BID   gabapentin  300 mg Oral QHS   glimepiride  4 mg Oral BID WC   heparin  5,000 Units Subcutaneous Q8H   insulin aspart  0-5 Units Subcutaneous QHS   insulin aspart  0-9 Units Subcutaneous TID WC   insulin aspart  3 Units Subcutaneous TID WC   insulin glargine-yfgn  10 Units Subcutaneous Daily   metoprolol tartrate  12.5 mg Oral BID   potassium chloride  40 mEq Oral BID   pravastatin  20 mg Oral Daily   vancomycin  125 mg Oral QID   Continuous Infusions:  metronidazole Stopped (08/04/23 0430)     LOS: 8 days    Time spent: 35 minutes  Willie Loy Hoover Brunette, DO [facility]  If 7PM-7AM, please contact night-coverage [email or web address] 08/04/2023, 12:09 PM

## 2023-08-05 DIAGNOSIS — K529 Noninfective gastroenteritis and colitis, unspecified: Secondary | ICD-10-CM | POA: Diagnosis not present

## 2023-08-05 LAB — BASIC METABOLIC PANEL
Anion gap: 8 (ref 5–15)
BUN: 9 mg/dL (ref 8–23)
CO2: 19 mmol/L — ABNORMAL LOW (ref 22–32)
Calcium: 8.5 mg/dL — ABNORMAL LOW (ref 8.9–10.3)
Chloride: 103 mmol/L (ref 98–111)
Creatinine, Ser: 0.63 mg/dL (ref 0.44–1.00)
GFR, Estimated: >60 mL/min (ref >60)
Glucose, Bld: 130 mg/dL — ABNORMAL HIGH (ref 70–99)
Potassium: 3.5 mmol/L (ref 3.5–5.1)
Sodium: 130 mmol/L — ABNORMAL LOW (ref 135–145)

## 2023-08-05 LAB — GLUCOSE, CAPILLARY
Glucose-Capillary: 154 mg/dL — ABNORMAL HIGH (ref 70–99)
Glucose-Capillary: 162 mg/dL — ABNORMAL HIGH (ref 70–99)
Glucose-Capillary: 70 mg/dL (ref 70–99)
Glucose-Capillary: 89 mg/dL (ref 70–99)

## 2023-08-05 LAB — CBC
HCT: 41 % (ref 36.0–46.0)
Hemoglobin: 13.6 g/dL (ref 12.0–15.0)
MCH: 25.6 pg — ABNORMAL LOW (ref 26.0–34.0)
MCHC: 33.2 g/dL (ref 30.0–36.0)
MCV: 77.2 fL — ABNORMAL LOW (ref 80.0–100.0)
Platelets: 240 10*3/uL (ref 150–400)
RBC: 5.31 MIL/uL — ABNORMAL HIGH (ref 3.87–5.11)
RDW: 16.4 % — ABNORMAL HIGH (ref 11.5–15.5)
WBC: 16.9 10*3/uL — ABNORMAL HIGH (ref 4.0–10.5)
nRBC: 0 % (ref 0.0–0.2)

## 2023-08-05 LAB — MAGNESIUM: Magnesium: 1.9 mg/dL (ref 1.7–2.4)

## 2023-08-05 MED ORDER — SODIUM CHLORIDE 0.9 % IV BOLUS
1000.0000 mL | Freq: Once | INTRAVENOUS | Status: AC
  Administered 2023-08-05: 1000 mL via INTRAVENOUS

## 2023-08-05 MED ORDER — LOSARTAN POTASSIUM 50 MG PO TABS
50.0000 mg | ORAL_TABLET | Freq: Two times a day (BID) | ORAL | Status: DC
  Administered 2023-08-05 – 2023-08-06 (×3): 50 mg via ORAL
  Filled 2023-08-05 (×3): qty 1

## 2023-08-05 NOTE — Progress Notes (Addendum)
   08/05/23 1342  Vitals  Temp 97.7 F (36.5 C)  Temp Source Oral  BP (!) 91/53  MAP (mmHg) 66  BP Method Automatic  Pulse Rate 78  Pulse Rate Source Monitor  MEWS COLOR  MEWS Score Color Green  Oxygen Therapy  SpO2 95 %  MEWS Score  MEWS Temp 0  MEWS Systolic 1  MEWS Pulse 0  MEWS RR 0  MEWS LOC 0  MEWS Score 1   MD Sherryll Burger notified. MD Sherryll Burger order ns bolus.

## 2023-08-05 NOTE — Plan of Care (Addendum)
Pt is alert and oriented x 4. Up with 1 assist. Pt had vomiting x 1. Zofran given x 2 this shift. Pt complaint of cough. Wheezing noted. On and off througout the night. Robitussin given x 2 and Maalox given x1. Pt felt that acid caused her to get nauseous the 1st time  Problem: Education: Goal: Knowledge of General Education information will improve Description: Including pain rating scale, medication(s)/side effects and non-pharmacologic comfort measures Outcome: Progressing   Problem: Health Behavior/Discharge Planning: Goal: Ability to manage health-related needs will improve Outcome: Progressing   Problem: Clinical Measurements: Goal: Ability to maintain clinical measurements within normal limits will improve Outcome: Progressing Goal: Will remain free from infection Outcome: Progressing Goal: Diagnostic test results will improve Outcome: Progressing Goal: Respiratory complications will improve Outcome: Progressing Goal: Cardiovascular complication will be avoided Outcome: Progressing   Problem: Activity: Goal: Risk for activity intolerance will decrease Outcome: Progressing   Problem: Nutrition: Goal: Adequate nutrition will be maintained Outcome: Progressing   Problem: Coping: Goal: Level of anxiety will decrease Outcome: Progressing   Problem: Elimination: Goal: Will not experience complications related to bowel motility Outcome: Progressing Goal: Will not experience complications related to urinary retention Outcome: Progressing   Problem: Pain Management: Goal: General experience of comfort will improve Outcome: Progressing   Problem: Safety: Goal: Ability to remain free from injury will improve Outcome: Progressing   Problem: Skin Integrity: Goal: Risk for impaired skin integrity will decrease Outcome: Progressing   Problem: Education: Goal: Ability to describe self-care measures that may prevent or decrease complications (Diabetes Survival Skills  Education) will improve Outcome: Progressing Goal: Individualized Educational Video(s) Outcome: Progressing   Problem: Coping: Goal: Ability to adjust to condition or change in health will improve Outcome: Progressing   Problem: Fluid Volume: Goal: Ability to maintain a balanced intake and output will improve Outcome: Progressing   Problem: Health Behavior/Discharge Planning: Goal: Ability to identify and utilize available resources and services will improve Outcome: Progressing Goal: Ability to manage health-related needs will improve Outcome: Progressing   Problem: Metabolic: Goal: Ability to maintain appropriate glucose levels will improve Outcome: Progressing   Problem: Nutritional: Goal: Maintenance of adequate nutrition will improve Outcome: Progressing Goal: Progress toward achieving an optimal weight will improve Outcome: Progressing   Problem: Skin Integrity: Goal: Risk for impaired skin integrity will decrease Outcome: Progressing   Problem: Tissue Perfusion: Goal: Adequacy of tissue perfusion will improve Outcome: Progressing

## 2023-08-05 NOTE — Progress Notes (Addendum)
PROGRESS NOTE    Tracy Bonilla  UJW:[id number] DOB: 03/21/1951 DOA: 07/27/2023 PCP: Elfredia Nevins, MD   Brief Narrative:   Tracy Bonilla  is a 72 y.o. female, with past medical history of hypertension, diabetes mellitus type 2, umbilical hernia, hyperlipidemia, patient presents to ED secondary to complaints of diarrhea, nausea, dehydration and poor oral intake, patient reports has been having diarrhea for last 5 days.  She was admitted for diarrhea evaluation and found to have C. difficile colitis and continues to remain on IV Flagyl and oral vancomycin with ongoing frequent loose stools.  Anticipate discharge in a.m. if further improved.  Assessment & Plan:   Principal Problem:   Colitis Active Problems:   Essential hypertension   Type 2 diabetes mellitus (HCC)  Assessment and Plan:     C. difficile colitis Diarrhea/colitis -Continues to have watery diarrhea, 8-10 episodes overnight Nonbloody -diarrhea, reducing frequency Encouraging p.o. intake-tolerated -POA: Watery diarrhea with abdominal pain, poor appetite -Continue IV Flagyl and p.o. vancomycin (last day 12/18) -Frequency of diarrhea is starting to improve, but slowly   AKI-improved-resolved -Likely due to dehydration, monitoring closely -Improved with IV fluids, holding for now  -Continue to hold HCTZ, but restart losartan     Hypertension-mildly hypertensive -Continue metoprolol and restart losartan -Continue to hold HCTZ given diarrhea   Sinus tachycardia Left bundle branch block -Still mildly tacky at rate 102 -EKG with  left bundle branch block, appears to be new as compared to most recent EKG which is in 2018, so it is of unclear chronicity, she denies any chest pain or shortness of breath,  - 2D Echo: EJF 40 to 45%. The left ventricle has mildly  decreased function. The left ventricle demonstrates global hypokinesis.  The left ventricular internal cavity size was mildly dilated. There is mild left ventricular  hypertrophy. Left ventricular diastolic parameters are consistent with Grade I diastolic  dysfunction -Patient was started on low-dose metoprolol     Diabetes mellitus with hyperglycemia -Restarted Amaryl and add mealtime insulin as well as Semglee daily -Blood glucose levels improving   Weakness-deconditioning -PT recommending home health services   Hyponatremia-improving -Hold further IV fluid and continue to trend -Recheck in a.m.  Obesity class I BMI 31.18   DVT prophylaxis: Heparin Code Status: Full Family Communication: None at bedside Disposition Plan:  Status is: Inpatient Remains inpatient appropriate because: Need for IV fluid and IV medication  Consultants:  None  Procedures:  None  Antimicrobials:  Anti-infectives (From admission, onward)    Start     Dose/Rate Route Frequency Ordered Stop   07/30/23 1200  metroNIDAZOLE (FLAGYL) IVPB 500 mg        500 mg 100 mL/hr over 60 Minutes Intravenous Every 8 hours 07/30/23 1113     07/29/23 1445  vancomycin (VANCOCIN) capsule 125 mg        125 mg Oral 4 times daily 07/29/23 1346 08/08/23 1359   07/28/23 0000  metroNIDAZOLE (FLAGYL) IVPB 500 mg  Status:  Discontinued        500 mg 100 mL/hr over 60 Minutes Intravenous Every 12 hours 07/27/23 1845 07/29/23 1350   07/28/23 0000  cefTRIAXone (ROCEPHIN) 2 g in sodium chloride 0.9 % 100 mL IVPB  Status:  Discontinued        2 g 200 mL/hr over 30 Minutes Intravenous Every 24 hours 07/27/23 1849 07/29/23 1345   07/27/23 1715  piperacillin-tazobactam (ZOSYN) IVPB 3.375 g        3.375 g 100 mL/hr  over 30 Minutes Intravenous  Once 07/27/23 1700 07/27/23 1832       Subjective: Patient seen and evaluated today with no further abdominal pain, but states that she is still having 6-7 liquidy bowel movements per night.  She is overall slowly starting to feel better.  Objective: Vitals:   08/04/23 1340 08/04/23 2022 08/05/23 0348 08/05/23 0922  BP: 124/68 (!) 145/87 (!)  146/87 130/78  Pulse: 82 92 (!) 110 93  Resp: 18 (!) 22 20   Temp: 97.8 F (36.6 C) (!) 97.5 F (36.4 C) 98.2 F (36.8 C)   TempSrc: Oral Oral    SpO2: 95% 98% 97%   Weight:      Height:        Intake/Output Summary (Last 24 hours) at 08/05/2023 1047 Last data filed at 08/05/2023 0900 Gross per 24 hour  Intake 960 ml  Output --  Net 960 ml   Filed Weights   07/27/23 1130  Weight: 79.8 kg    Examination:  General exam: Appears calm and comfortable  Respiratory system: Clear to auscultation. Respiratory effort normal. Cardiovascular system: S1 & S2 heard, RRR.  Gastrointestinal system: Abdomen is soft Central nervous system: Alert and awake Extremities: No edema Skin: No significant lesions noted Psychiatry: Flat affect.    Data Reviewed: I have personally reviewed following labs and imaging studies  CBC: Recent Labs  Lab 08/01/23 0506 08/02/23 0436 08/03/23 0417 08/04/23 0433 08/05/23 0439  WBC 17.0* 17.5* 19.2* 17.5* 16.9*  HGB 15.4* 14.3 14.0 13.2 13.6  HCT 46.8* 44.8 43.4 40.4 41.0  MCV 76.6* 77.2* 77.2* 77.2* 77.2*  PLT 345 312 314 277 240   Basic Metabolic Panel: Recent Labs  Lab 08/01/23 0506 08/02/23 0436 08/03/23 0417 08/04/23 0433 08/05/23 0439  NA 128* 127* 126* 129* 130*  K 3.7 3.4* 3.9 3.4* 3.5  CL 100 100 98 102 103  CO2 14* 17* 19* 20* 19*  GLUCOSE 199* 183* 202* 118* 130*  BUN 27* 26* 20 12 9   CREATININE 0.85 0.81 0.72 0.57 0.63  CALCIUM 8.8* 8.7* 8.8* 8.4* 8.5*  MG  --   --  2.4 2.0 1.9   GFR: Estimated Creatinine Clearance: 64.6 mL/min (by C-G formula based on SCr of 0.63 mg/dL). Liver Function Tests: No results for input(s): "AST", "ALT", "ALKPHOS", "BILITOT", "PROT", "ALBUMIN" in the last 168 hours.  No results for input(s): "LIPASE", "AMYLASE" in the last 168 hours.  No results for input(s): "AMMONIA" in the last 168 hours. Coagulation Profile: No results for input(s): "INR", "PROTIME" in the last 168 hours. Cardiac  Enzymes: No results for input(s): "CKTOTAL", "CKMB", "CKMBINDEX", "TROPONINI" in the last 168 hours. BNP (last 3 results) No results for input(s): "PROBNP" in the last 8760 hours. HbA1C: No results for input(s): "HGBA1C" in the last 72 hours. CBG: Recent Labs  Lab 08/04/23 0728 08/04/23 1122 08/04/23 1607 08/04/23 2113 08/05/23 0722  GLUCAP 102* 139* 100* 140* 154*   Lipid Profile: No results for input(s): "CHOL", "HDL", "LDLCALC", "TRIG", "CHOLHDL", "LDLDIRECT" in the last 72 hours. Thyroid Function Tests: No results for input(s): "TSH", "T4TOTAL", "FREET4", "T3FREE", "THYROIDAB" in the last 72 hours. Anemia Panel: No results for input(s): "VITAMINB12", "FOLATE", "FERRITIN", "TIBC", "IRON", "RETICCTPCT" in the last 72 hours. Sepsis Labs: No results for input(s): "PROCALCITON", "LATICACIDVEN" in the last 168 hours.   Recent Results (from the past 240 hours)  C Difficile Quick Screen w PCR reflex     Status: Abnormal   Collection Time: 07/27/23  2:35  PM   Specimen: STOOL  Result Value Ref Range Status   C Diff antigen POSITIVE (A) NEGATIVE Final   C Diff toxin POSITIVE (A) NEGATIVE Final   C Diff interpretation Toxin producing C. difficile detected.  Final    Comment: CRITICAL RESULT CALLED TO, READ BACK BY AND VERIFIED WITH: K. WATTS ON 07/29/2023 @12 :47 BY T.HAMER Performed at [facility], [address] [place], [place] [address]   Blood culture (routine x 2)     Status: None   Collection Time: 07/27/23  5:29 PM   Specimen: BLOOD  Result Value Ref Range Status   Specimen Description BLOOD LEFT ANTECUBITAL  Final   Special Requests   Final    Blood Culture adequate volume BOTTLES DRAWN AEROBIC AND ANAEROBIC   Culture   Final    NO GROWTH 5 DAYS Performed at [facility], [address], [place], [place] [address]    Report Status 08/01/2023 FINAL  Final  Blood culture (routine x 2)     Status: None   Collection Time: 07/27/23  5:40 PM   Specimen: BLOOD  Result  Value Ref Range Status   Specimen Description BLOOD RIGHT ANTECUBITAL  Final   Special Requests   Final    Blood Culture adequate volume BOTTLES DRAWN AEROBIC AND ANAEROBIC   Culture   Final    NO GROWTH 5 DAYS Performed at [facility], [address] [place], [place] [address]    Report Status 08/01/2023 FINAL  Final         Radiology Studies: No results found.      Scheduled Meds:  acidophilus  2 capsule Oral TID   calcium carbonate  1 tablet Oral Q breakfast   gabapentin  100 mg Oral BID   gabapentin  300 mg Oral QHS   glimepiride  4 mg Oral BID WC   heparin  5,000 Units Subcutaneous Q8H   insulin aspart  0-5 Units Subcutaneous QHS   insulin aspart  0-9 Units Subcutaneous TID WC   insulin aspart  3 Units Subcutaneous TID WC   insulin glargine-yfgn  10 Units Subcutaneous Daily   metoprolol tartrate  12.5 mg Oral BID   pravastatin  20 mg Oral Daily   vancomycin  125 mg Oral QID   Continuous Infusions:  metronidazole 500 mg (08/05/23 0347)     LOS: 9 days    Time spent: 35 minutes    Vashon Arch Hoover Brunette, DO [facility]  If 7PM-7AM, please contact night-coverage [email or web address] 08/05/2023, 10:47 AM

## 2023-08-06 DIAGNOSIS — E119 Type 2 diabetes mellitus without complications: Secondary | ICD-10-CM | POA: Diagnosis not present

## 2023-08-06 DIAGNOSIS — N179 Acute kidney failure, unspecified: Secondary | ICD-10-CM | POA: Diagnosis not present

## 2023-08-06 DIAGNOSIS — I10 Essential (primary) hypertension: Secondary | ICD-10-CM | POA: Diagnosis not present

## 2023-08-06 DIAGNOSIS — E876 Hypokalemia: Secondary | ICD-10-CM

## 2023-08-06 DIAGNOSIS — K529 Noninfective gastroenteritis and colitis, unspecified: Secondary | ICD-10-CM | POA: Diagnosis not present

## 2023-08-06 LAB — CBC
HCT: 38.1 % (ref 36.0–46.0)
Hemoglobin: 12.4 g/dL (ref 12.0–15.0)
MCH: 25.5 pg — ABNORMAL LOW (ref 26.0–34.0)
MCHC: 32.5 g/dL (ref 30.0–36.0)
MCV: 78.2 fL — ABNORMAL LOW (ref 80.0–100.0)
Platelets: 263 10*3/uL (ref 150–400)
RBC: 4.87 MIL/uL (ref 3.87–5.11)
RDW: 16.8 % — ABNORMAL HIGH (ref 11.5–15.5)
WBC: 17.4 10*3/uL — ABNORMAL HIGH (ref 4.0–10.5)
nRBC: 0 % (ref 0.0–0.2)

## 2023-08-06 LAB — GLUCOSE, CAPILLARY
Glucose-Capillary: 55 mg/dL — ABNORMAL LOW (ref 70–99)
Glucose-Capillary: 57 mg/dL — ABNORMAL LOW (ref 70–99)
Glucose-Capillary: 165 mg/dL — ABNORMAL HIGH (ref 70–99)

## 2023-08-06 LAB — MAGNESIUM: Magnesium: 1.9 mg/dL (ref 1.7–2.4)

## 2023-08-06 LAB — BASIC METABOLIC PANEL
Anion gap: 8 (ref 5–15)
BUN: 7 mg/dL — ABNORMAL LOW (ref 8–23)
CO2: 20 mmol/L — ABNORMAL LOW (ref 22–32)
Calcium: 8.4 mg/dL — ABNORMAL LOW (ref 8.9–10.3)
Chloride: 104 mmol/L (ref 98–111)
Creatinine, Ser: 0.52 mg/dL (ref 0.44–1.00)
GFR, Estimated: >60 mL/min (ref >60)
Glucose, Bld: 52 mg/dL — ABNORMAL LOW (ref 70–99)
Potassium: 3.2 mmol/L — ABNORMAL LOW (ref 3.5–5.1)
Sodium: 132 mmol/L — ABNORMAL LOW (ref 135–145)

## 2023-08-06 MED ORDER — METOPROLOL TARTRATE 25 MG PO TABS: 12.5000 mg | ORAL_TABLET | Freq: Two times a day (BID) | ORAL | 1 refills | Status: AC

## 2023-08-06 MED ORDER — RISAQUAD PO CAPS: 2.0000 | ORAL_CAPSULE | Freq: Three times a day (TID) | ORAL | 0 refills | Status: AC

## 2023-08-06 MED ORDER — POTASSIUM CHLORIDE CRYS ER 20 MEQ PO TBCR: 40.0000 meq | EXTENDED_RELEASE_TABLET | Freq: Once | ORAL | Status: DC

## 2023-08-06 MED ORDER — POTASSIUM CHLORIDE 20 MEQ PO PACK: 20.0000 meq | PACK | Freq: Every day | ORAL | 0 refills | Status: DC

## 2023-08-06 MED ORDER — VANCOMYCIN HCL 125 MG PO CAPS: 125.0000 mg | ORAL_CAPSULE | Freq: Four times a day (QID) | ORAL | 0 refills | Status: DC

## 2023-08-06 MED ORDER — METOPROLOL TARTRATE 25 MG PO TABS: 12.5000 mg | ORAL_TABLET | Freq: Two times a day (BID) | ORAL | 1 refills | Status: DC

## 2023-08-06 MED ORDER — POTASSIUM CHLORIDE 20 MEQ PO PACK
40.0000 meq | PACK | Freq: Every day | ORAL | Status: DC
  Administered 2023-08-06: 40 meq via ORAL
  Filled 2023-08-06: qty 2

## 2023-08-06 MED ORDER — METFORMIN HCL ER 500 MG PO TB24: 500.0000 mg | ORAL_TABLET | Freq: Every day | ORAL | Status: AC

## 2023-08-06 MED ORDER — RISAQUAD PO CAPS: 2.0000 | ORAL_CAPSULE | Freq: Three times a day (TID) | ORAL | 0 refills | Status: DC

## 2023-08-06 NOTE — Plan of Care (Signed)

## 2023-08-06 NOTE — Progress Notes (Signed)
Patient has discharge orders, discharge teaching given and no further questions at this time, pt wheeled down to main lobby by staff to vehicle accompanied by husband.

## 2023-08-06 NOTE — Care Management Important Message (Signed)
Important Message  Patient Details  Name: Tracy Bonilla MRN: [id number] Date of Birth: 09/29/1950   Important Message Given:  Yes - Medicare IM (reviewed letter by phone,no additonal copy needed)     Corey Harold 08/06/2023, 1:43 PM

## 2023-08-06 NOTE — Discharge Summary (Signed)
Physician Discharge Summary   Patient: Tracy Bonilla MRN: [id number] DOB: [birth year]  Admit date:     07/27/2023  Discharge date: 08/06/23  Discharge Physician: Vassie Loll   PCP: Elfredia Nevins, MD   Recommendations at discharge:  Reassess blood pressure and adjust antihypertensive regimen as needed Repeat CBC to follow hemoglobin trend/stability Repeat basic metabolic panel to follow renal function and electrolytes Reassess blood sugar and further adjust hypoglycemic regimen as required. Make sure patient follow-up with gastroenterology in the next 3 weeks. Continue assisting patient with weight loss management.  Discharge Diagnoses: Principal Problem:   Colitis Active Problems:   Essential hypertension   Type 2 diabetes mellitus (HCC)   AKI (acute kidney injury) (HCC)   Hypokalemia Class I obesity   Brief Hospital admission course: As per H&P written by Dr. Randol Kern on 07/27/2023  Tracy Bonilla  is a 72 y.o. female, with past medical history of hypertension, diabetes mellitus type 2, umbilical hernia, hyperlipidemia, patient presents to ED secondary to complaints of diarrhea, nausea, dehydration and poor oral intake, patient reports has been having diarrhea for last 5 days, some abdominal cramping, she denies any sick contact, vomiting, but reports poor oral intake, denies any bright red blood per rectum, she denies any sick contacts, reports most recent antibiotics used was September for otitis and ear infection -In ED labs significant for sodium 129, glucose 214, creatinine 2.2 with BUN of 30, her white blood cell count elevated at 18.5, CT abdomen pelvis was obtained significant for pancolitis, [facility] hospitalist consulted to admit.  Assessment and Plan: Diarrhea/colitis -Continues to have watery diarrhea, 8-10 episodes overnight Nonbloody -diarrhea, reducing frequency and overall improved. Encouraging to maintain adequate nutrition and hydration. -Patient has been discharged  home on oral vancomycin to complete 10 days treatment. -Case discussed with gastroenterology service who will arrange outpatient follow-up in the next 3 weeks.   AKI-improved-resolved at time of discharge. -Likely due to dehydration -Repeat basic metabolic panel follow-up visit. -Continue to hold hydrochlorothiazide -Patient advised to maintain adequate hydration.   Hypertension-mildly hypertensive -Continue metoprolol and restarted on  losartan -Continue to hold HCTZ given diarrhea   Sinus tachycardia Left bundle branch block -Still mildly tacky at rate 102 -EKG with  left bundle branch block, appears to be new as compared to most recent EKG which is in 2018, so it is of unclear chronicity, she denies any chest pain or shortness of breath,  - 2D Echo: EJF 40 to 45%. The left ventricle has mildly  decreased function. The left ventricle demonstrates global hypokinesis.  The left ventricular internal cavity size was mildly dilated. There is mild left ventricular hypertrophy. Left ventricular diastolic parameters are consistent with Grade I diastolic  dysfunction -Patient was started on low-dose metoprolol -Will recommend outpatient follow-up with cardiology service.     Diabetes mellitus with hyperglycemia -Mild hypoglycemic event after not having enough oral intake while inpatient -Resume home oral hypoglycemic agents -Continue close monitoring of patient's CBGs/A1c with further adjustment to diabetes therapy as required.   Weakness-deconditioning -Appreciate assistance and recommendation by physical therapy. -Home health services arranged for PT at discharge   Hyponatremia-improving/stable -Patient vies to maintain adequate hydration -Repeat basic metabolic panel follow-up visit.   Obesity class I -BMI 31.18 -Low-calorie diet, portion control and increase physical activity discussed with patient.   Consultants: GI service curbside (recommendations given for follow-up as an  outpatient in the next 3 weeks). Procedures performed: See below for x-ray reports. Disposition: Home Diet recommendation: Heart healthy/low-sodium diet  and modified carbohydrates.  DISCHARGE MEDICATION: Allergies as of 08/06/2023   No Known Allergies      Medication List     PAUSE taking these medications    hydrochlorothiazide 25 MG tablet Wait to take this until: August 14, 2023 Commonly known as: HYDRODIURIL Take 25 mg by mouth daily with lunch.       STOP taking these medications    loperamide 2 MG capsule Commonly known as: IMODIUM   potassium chloride 10 MEQ tablet Commonly known as: KLOR-CON Replaced by: potassium chloride 20 MEQ packet       TAKE these medications    acidophilus Caps capsule Take 2 capsules by mouth 3 (three) times daily.   calcium citrate 950 (200 Ca) MG tablet Commonly known as: CALCITRATE - dosed in mg elemental calcium Take 200 mg of elemental calcium by mouth daily. W/ vit D   dimenhyDRINATE 50 MG tablet Commonly known as: DRAMAMINE Take 25 mg by mouth every 8 (eight) hours as needed for dizziness.   gabapentin 100 MG capsule Commonly known as: NEURONTIN Take 100-300 mg by mouth 3 (three) times daily. 100mg  am, 100mg  noon, 300mg  qhs   glimepiride 4 MG tablet Commonly known as: AMARYL Take 4 mg by mouth 2 (two) times daily.   loratadine 10 MG tablet Commonly known as: CLARITIN Take 10 mg by mouth daily.   losartan 50 MG tablet Commonly known as: COZAAR Take 50 mg by mouth 2 (two) times daily.   metFORMIN 500 MG 24 hr tablet Commonly known as: GLUCOPHAGE-XR Take 1 tablet (500 mg total) by mouth daily with breakfast. What changed: when to take this   metoprolol tartrate 25 MG tablet Commonly known as: LOPRESSOR Take 0.5 tablets (12.5 mg total) by mouth 2 (two) times daily.   ondansetron 4 MG disintegrating tablet Commonly known as: ZOFRAN-ODT Take 4-8 mg by mouth every 6 (six) hours as needed for nausea or  vomiting.   potassium chloride 20 MEQ packet Commonly known as: KLOR-CON Take 20 mEq by mouth daily for 10 days. Start taking on: August 07, 2023 Replaces: potassium chloride 10 MEQ tablet   pravastatin 20 MG tablet Commonly known as: PRAVACHOL Take 20 mg by mouth daily.   vancomycin 125 MG capsule Commonly known as: VANCOCIN Take 1 capsule (125 mg total) by mouth 4 (four) times daily for 10 days.        Follow-up Information     [facility] Follow up.   Specialty: Home Health Services Why: PT will call to schedule your home visit. Contact information: [address] [place] [address] [phone]         Elfredia Nevins, MD. Schedule an appointment as soon as possible for a visit in 10 day(s).   Specialty: Internal Medicine Contact information: [address] [place] [address] [phone]                Discharge Exam: Ceasar Mons Weights   07/27/23 1130  Weight: 79.8 kg   General exam: Alert, awake, oriented x 3; no nausea, no vomiting, no abdominal pain and feeling ready for discharge.  Still having some loose stools but significant improvement in the amount of episodes per day. Respiratory system: Clear to auscultation. Respiratory effort normal.  Saturation on room air. Cardiovascular system:RRR. No rubs or gallops; no JVD. Gastrointestinal system: Abdomen is obese, nondistended, soft and nontender. No organomegaly or masses felt. Normal bowel sounds heard. Central nervous system: Alert and oriented. No focal neurological  deficits. Extremities: No cyanosis or clubbing. Skin: No petechiae. Psychiatry: Judgement and insight appear normal. Mood & affect appropriate.    Condition at discharge: Stable and improved.  The results of significant diagnostics from this hospitalization (including imaging, microbiology, ancillary and laboratory) are listed below for reference.   Imaging Studies: ECHOCARDIOGRAM  COMPLETE Result Date: 07/29/2023    ECHOCARDIOGRAM REPORT   Patient Name:   IOANA VERON Date of Exam: 07/28/2023 Medical Rec #:  [id number]    Height:       63.0 in Accession #:    [id number]   Weight:       176.0 lb Date of Birth:  1951-06-17     BSA:          1.831 m Patient Age:    72 years     BP:           116/60 mmHg Patient Gender: F            HR:           84 bpm. [facility] Procedure: 2D Echo, Cardiac Doppler and Color Doppler Indications:    LBBB  History:        Patient has no prior history of Echocardiogram examinations.                 Risk Factors:Hypertension, Dyslipidemia and Diabetes.  Sonographer:    Milda Smart Referring Phys: 24 [address] ELGERGAWY IMPRESSIONS  1. Abnormal septal motion ? from LBBB. Left ventricular ejection fraction, by estimation, is 40 to 45%. The left ventricle has mildly decreased function. The left ventricle demonstrates global hypokinesis. The left ventricular internal cavity size was mildly dilated. There is mild left ventricular hypertrophy. Left ventricular diastolic parameters are consistent with Grade I diastolic dysfunction (impaired relaxation).  2. Right ventricular systolic function is normal. The right ventricular size is normal.  3. Left atrial size was mildly dilated.  4. The mitral valve is normal in structure. No evidence of mitral valve regurgitation. No evidence of mitral stenosis.  5. The aortic valve is tricuspid. Aortic valve regurgitation is not visualized. No aortic stenosis is present.  6. The inferior vena cava is normal in size with greater than 50% respiratory variability, suggesting right atrial pressure of 3 mmHg. FINDINGS  Left Ventricle: Abnormal septal motion ? from LBBB. Left ventricular ejection fraction, by estimation, is 40 to 45%. The left ventricle has mildly decreased function. The left ventricle demonstrates global hypokinesis. The left ventricular internal cavity size was mildly dilated. There is mild left  ventricular hypertrophy. Left ventricular diastolic parameters are consistent with Grade I diastolic dysfunction (impaired relaxation). Right Ventricle: The right ventricular size is normal. No increase in right ventricular wall thickness. Right ventricular systolic function is normal. Left Atrium: Left atrial size was mildly dilated. Right Atrium: Right atrial size was normal in size. Pericardium: There is no evidence of pericardial effusion. Mitral Valve: The mitral valve is normal in structure. No evidence of mitral valve regurgitation. No evidence of mitral valve stenosis. Tricuspid Valve: The tricuspid valve is normal in structure. Tricuspid valve regurgitation is not demonstrated. No evidence of tricuspid stenosis. Aortic Valve: The aortic valve is tricuspid. Aortic valve regurgitation is not visualized. No aortic stenosis is present. Pulmonic Valve: The pulmonic valve was normal in structure. Pulmonic valve regurgitation is not visualized. No evidence of pulmonic stenosis. Aorta: The aortic root is normal in size and structure. Venous: The inferior vena cava is normal in size  with greater than 50% respiratory variability, suggesting right atrial pressure of 3 mmHg. IAS/Shunts: No atrial level shunt detected by color flow Doppler.  LEFT VENTRICLE PLAX 2D LVIDd:         4.00 cm   Diastology LVIDs:         3.10 cm   LV e' medial:  6.64 cm/s LV PW:         1.20 cm   LV e' lateral: 4.35 cm/s LV IVS:        1.30 cm LVOT diam:     2.20 cm LV SV:         69 LV SV Index:   38 LVOT Area:     3.80 cm  RIGHT VENTRICLE             IVC RV S prime:     10.20 cm/s  IVC diam: 1.20 cm TAPSE (M-mode): 1.3 cm LEFT ATRIUM             Index        RIGHT ATRIUM           Index LA diam:        3.90 cm 2.13 cm/m   RA Area:     11.30 cm LA Vol (A2C):   27.8 ml 15.18 ml/m  RA Volume:   21.80 ml  11.90 ml/m LA Vol (A4C):   31.7 ml 17.31 ml/m LA Biplane Vol: 29.7 ml 16.22 ml/m  AORTIC VALVE LVOT Vmax:   118.00 cm/s LVOT Vmean:   77.300 cm/s LVOT VTI:    0.182 m  AORTA Ao Root diam: 3.20 cm Ao Asc diam:  3.60 cm  SHUNTS Systemic VTI:  0.18 m Systemic Diam: 2.20 cm Charlton Haws MD Electronically signed by Charlton Haws MD Signature Date/Time: 07/29/2023/7:57:28 AM    Final    CT ABDOMEN PELVIS WO CONTRAST Result Date: 07/27/2023 CLINICAL DATA:  One-week history of diarrhea and dry heaving with decreased appetite EXAM: CT ABDOMEN AND PELVIS WITHOUT CONTRAST TECHNIQUE: Multidetector CT imaging of the abdomen and pelvis was performed following the standard protocol without IV contrast. RADIATION DOSE REDUCTION: This exam was performed according to the departmental dose-optimization program which includes automated exposure control, adjustment of the mA and/or kV according to patient size and/or use of iterative reconstruction technique. COMPARISON:  None Available. FINDINGS: Decreased sensitivity and specificity for detailed findings due to motion artifact. Lower chest: No focal consolidation or pulmonary nodule in the lung bases. No pleural effusion or pneumothorax demonstrated. Partially imaged heart size is normal. Hepatobiliary: No focal hepatic lesions. No intra or extrahepatic biliary ductal dilation. Mildly distended gallbladder with layering hyperattenuation, which may represent stones/sludge. Pancreas: No focal lesions or main ductal dilation. Spleen: Normal in size without focal abnormality. Adrenals/Urinary Tract: No adrenal nodules. No suspicious renal mass on this noncontrast enhanced examination, calculi or hydronephrosis. No focal bladder wall thickening. Stomach/Bowel: Normal appearance of the stomach. Diffusely patulous, featureless appearance of the colon with marked circumferential mural thickening. Normal appearance of the terminal ileum. Mild mural thickening of the appendiceal base. Vascular/Lymphatic: Aortic atherosclerosis. No enlarged abdominal or pelvic lymph nodes. Reproductive: No adnexal masses. Other: Small volume  ascites.  No free air or fluid collection. Musculoskeletal: No acute or abnormal lytic or blastic osseous lesions. Multilevel degenerative changes of the partially imaged thoracic and lumbar spine. Anterior wedging of L1. IMPRESSION: 1. Findings of pancolitis, which may be infectious or inflammatory in the acute setting. Given appearance of marked mural thickening and  ascites, C diff colitis is within the differential. 2. Mild mural thickening of the appendiceal base, likely reactive. 3. Small volume ascites. 4. Mildly distended gallbladder with layering hyperattenuation, which may represent stones/sludge. 5.  Aortic Atherosclerosis (ICD10-I70.0). Electronically Signed   By: Agustin Cree M.D.   On: 07/27/2023 16:31    Microbiology: Results for orders placed or performed during the hospital encounter of 07/27/23  C Difficile Quick Screen w PCR reflex     Status: Abnormal   Collection Time: 07/27/23  2:35 PM   Specimen: STOOL  Result Value Ref Range Status   C Diff antigen POSITIVE (A) NEGATIVE Final   C Diff toxin POSITIVE (A) NEGATIVE Final   C Diff interpretation Toxin producing C. difficile detected.  Final    Comment: CRITICAL RESULT CALLED TO, READ BACK BY AND VERIFIED WITH: K. WATTS ON 07/29/2023 @12 :47 BY T.HAMER Performed at [facility], [address] [place], [place] [address]   Blood culture (routine x 2)     Status: None   Collection Time: 07/27/23  5:29 PM   Specimen: BLOOD  Result Value Ref Range Status   Specimen Description BLOOD LEFT ANTECUBITAL  Final   Special Requests   Final    Blood Culture adequate volume BOTTLES DRAWN AEROBIC AND ANAEROBIC   Culture   Final    NO GROWTH 5 DAYS Performed at [facility], [address], [place], [place] [address]    Report Status 08/01/2023 FINAL  Final  Blood culture (routine x 2)     Status: None   Collection Time: 07/27/23  5:40 PM   Specimen: BLOOD  Result Value Ref Range Status   Specimen Description BLOOD RIGHT ANTECUBITAL   Final   Special Requests   Final    Blood Culture adequate volume BOTTLES DRAWN AEROBIC AND ANAEROBIC   Culture   Final    NO GROWTH 5 DAYS Performed at [facility], [address], [place], [place] [address]    Report Status 08/01/2023 FINAL  Final    Labs: CBC: Recent Labs  Lab 08/02/23 0436 08/03/23 0417 08/04/23 0433 08/05/23 0439 08/06/23 0414  WBC 17.5* 19.2* 17.5* 16.9* 17.4*  HGB 14.3 14.0 13.2 13.6 12.4  HCT 44.8 43.4 40.4 41.0 38.1  MCV 77.2* 77.2* 77.2* 77.2* 78.2*  PLT 312 314 277 240 263   Basic Metabolic Panel: Recent Labs  Lab 08/02/23 0436 08/03/23 0417 08/04/23 0433 08/05/23 0439 08/06/23 0414  NA 127* 126* 129* 130* 132*  K 3.4* 3.9 3.4* 3.5 3.2*  CL 100 98 102 103 104  CO2 17* 19* 20* 19* 20*  GLUCOSE 183* 202* 118* 130* 52*  BUN 26* 20 12 9  7*  CREATININE 0.81 0.72 0.57 0.63 0.52  CALCIUM 8.7* 8.8* 8.4* 8.5* 8.4*  MG  --  2.4 2.0 1.9 1.9   CBG: Recent Labs  Lab 08/05/23 1631 08/05/23 2202 08/06/23 0742 08/06/23 0759 08/06/23 1125  GLUCAP 70 89 55* 57* 165*    Discharge time spent: greater than 30 minutes.  Signed: Vassie Loll, MD [facility] 08/06/2023

## 2023-08-06 NOTE — TOC Transition Note (Signed)
Transition of Care [facility]) - Discharge Note   Patient Details  Name: Tracy Bonilla MRN: [id number] Date of Birth: 1951-06-10  Transition of Care [facility]) CM/SW Contact:  Leitha Bleak, RN Phone Number: 08/06/2023, 1:56 PM   Clinical Narrative:     Patient discharging home with [facility] home health,orders placed and [facility] updated.    Final next level of care: Home w Home Health Services Barriers to Discharge: Barriers Resolved  Patient Goals and CMS Choice Patient states their goals for this hospitalization and ongoing recovery are:: return home CMS Medicare.gov Compare Post Acute Care list provided to:: Patient Choice offered to / list presented to : Patient     Discharge Placement          Patient and family notified of of transfer: 08/06/23  Discharge Plan and Services Additional resources added to the After Visit Summary for   In-house Referral: Clinical Social Work Discharge Planning Services: CM Consult Post Acute Care Choice: Home Health                Social Drivers of Health (SDOH) Interventions SDOH Screenings   Food Insecurity: No Food Insecurity (07/27/2023)  Housing: Low Risk  (07/27/2023)  Transportation Needs: No Transportation Needs (07/27/2023)  Utilities: Not At Risk (07/27/2023)  Tobacco Use: Low Risk  (07/27/2023)    Readmission Risk Interventions     No data to display

## 2023-08-06 NOTE — Progress Notes (Signed)
Mobility Specialist Progress Note:    08/06/23 1000  Mobility  Activity Ambulated with assistance to bathroom  Level of Assistance Standby assist, set-up cues, supervision of patient - no hands on  Assistive Device Front wheel walker  Distance Ambulated (ft) 15 ft  Range of Motion/Exercises Active;All extremities  Activity Response Tolerated well  Mobility Referral Yes  Mobility visit 1 Mobility  Mobility Specialist Start Time (ACUTE ONLY) 0930  Mobility Specialist Stop Time (ACUTE ONLY) 0945  Mobility Specialist Time Calculation (min) (ACUTE ONLY) 15 min   Pt received in bed, requesting assistance to bathroom. Required SBA to stand and ambulate with RW. Tolerated well, pt refused mobility d/t fatigue. Left pt with NT for peri care, all needs met.   Lawerance Bach Mobility Specialist Please contact via Special educational needs teacher or  Rehab office at [phone]

## 2023-08-10 DIAGNOSIS — M6281 Muscle weakness (generalized): Secondary | ICD-10-CM | POA: Diagnosis not present

## 2023-08-10 DIAGNOSIS — Z710 Person encountering health services to consult on behalf of another person: Secondary | ICD-10-CM | POA: Diagnosis not present

## 2023-08-10 DIAGNOSIS — E1142 Type 2 diabetes mellitus with diabetic polyneuropathy: Secondary | ICD-10-CM | POA: Diagnosis not present

## 2023-08-16 ENCOUNTER — Inpatient Hospital Stay (HOSPITAL_COMMUNITY)
Admission: EM | Admit: 2023-08-16 | Discharge: 2023-08-19 | DRG: 378 | Disposition: A | Discharge status: Home / Self Care | Payer: Medicare Other | Attending: Internal Medicine | Admitting: Internal Medicine

## 2023-08-16 ENCOUNTER — Other Ambulatory Visit: Payer: Self-pay

## 2023-08-16 ENCOUNTER — Encounter (HOSPITAL_COMMUNITY): Payer: Self-pay

## 2023-08-16 ENCOUNTER — Inpatient Hospital Stay (HOSPITAL_COMMUNITY): Payer: Medicare Other

## 2023-08-16 DIAGNOSIS — E785 Hyperlipidemia, unspecified: Secondary | ICD-10-CM | POA: Diagnosis not present

## 2023-08-16 DIAGNOSIS — K648 Other hemorrhoids: Secondary | ICD-10-CM | POA: Diagnosis present

## 2023-08-16 DIAGNOSIS — A0472 Enterocolitis due to Clostridium difficile, not specified as recurrent: Secondary | ICD-10-CM

## 2023-08-16 DIAGNOSIS — R6889 Other general symptoms and signs: Secondary | ICD-10-CM | POA: Diagnosis not present

## 2023-08-16 DIAGNOSIS — Z7984 Long term (current) use of oral hypoglycemic drugs: Secondary | ICD-10-CM

## 2023-08-16 DIAGNOSIS — E876 Hypokalemia: Secondary | ICD-10-CM | POA: Diagnosis present

## 2023-08-16 DIAGNOSIS — K6389 Other specified diseases of intestine: Secondary | ICD-10-CM | POA: Diagnosis not present

## 2023-08-16 DIAGNOSIS — R000 Tachycardia, unspecified: Secondary | ICD-10-CM | POA: Diagnosis present

## 2023-08-16 DIAGNOSIS — Z8619 Personal history of other infectious and parasitic diseases: Secondary | ICD-10-CM

## 2023-08-16 DIAGNOSIS — Z8249 Family history of ischemic heart disease and other diseases of the circulatory system: Secondary | ICD-10-CM | POA: Diagnosis not present

## 2023-08-16 DIAGNOSIS — K644 Residual hemorrhoidal skin tags: Secondary | ICD-10-CM | POA: Diagnosis not present

## 2023-08-16 DIAGNOSIS — N3289 Other specified disorders of bladder: Secondary | ICD-10-CM | POA: Diagnosis not present

## 2023-08-16 DIAGNOSIS — K625 Hemorrhage of anus and rectum: Secondary | ICD-10-CM

## 2023-08-16 DIAGNOSIS — K922 Gastrointestinal hemorrhage, unspecified: Principal | ICD-10-CM

## 2023-08-16 DIAGNOSIS — E1165 Type 2 diabetes mellitus with hyperglycemia: Secondary | ICD-10-CM | POA: Diagnosis not present

## 2023-08-16 DIAGNOSIS — Z96653 Presence of artificial knee joint, bilateral: Secondary | ICD-10-CM | POA: Diagnosis not present

## 2023-08-16 DIAGNOSIS — Z6831 Body mass index (BMI) 31.0-31.9, adult: Secondary | ICD-10-CM

## 2023-08-16 DIAGNOSIS — D62 Acute posthemorrhagic anemia: Secondary | ICD-10-CM | POA: Diagnosis not present

## 2023-08-16 DIAGNOSIS — Z807 Family history of other malignant neoplasms of lymphoid, hematopoietic and related tissues: Secondary | ICD-10-CM

## 2023-08-16 DIAGNOSIS — K921 Melena: Principal | ICD-10-CM | POA: Diagnosis present

## 2023-08-16 DIAGNOSIS — E66811 Obesity, class 1: Secondary | ICD-10-CM | POA: Diagnosis present

## 2023-08-16 DIAGNOSIS — K5989 Other specified functional intestinal disorders: Secondary | ICD-10-CM | POA: Diagnosis not present

## 2023-08-16 DIAGNOSIS — Z79899 Other long term (current) drug therapy: Secondary | ICD-10-CM | POA: Diagnosis not present

## 2023-08-16 DIAGNOSIS — R58 Hemorrhage, not elsewhere classified: Secondary | ICD-10-CM | POA: Diagnosis not present

## 2023-08-16 DIAGNOSIS — Z743 Need for continuous supervision: Secondary | ICD-10-CM | POA: Diagnosis not present

## 2023-08-16 DIAGNOSIS — R791 Abnormal coagulation profile: Secondary | ICD-10-CM | POA: Diagnosis not present

## 2023-08-16 DIAGNOSIS — E119 Type 2 diabetes mellitus without complications: Secondary | ICD-10-CM | POA: Diagnosis not present

## 2023-08-16 DIAGNOSIS — Z7901 Long term (current) use of anticoagulants: Secondary | ICD-10-CM | POA: Diagnosis not present

## 2023-08-16 DIAGNOSIS — I10 Essential (primary) hypertension: Secondary | ICD-10-CM | POA: Diagnosis not present

## 2023-08-16 DIAGNOSIS — I700 Atherosclerosis of aorta: Secondary | ICD-10-CM | POA: Diagnosis not present

## 2023-08-16 DIAGNOSIS — J9811 Atelectasis: Secondary | ICD-10-CM | POA: Diagnosis not present

## 2023-08-16 DIAGNOSIS — I499 Cardiac arrhythmia, unspecified: Secondary | ICD-10-CM | POA: Diagnosis not present

## 2023-08-16 LAB — COMPREHENSIVE METABOLIC PANEL
ALT: 16 U/L (ref 0–44)
AST: 19 U/L (ref 15–41)
Albumin: 2.9 g/dL — ABNORMAL LOW (ref 3.5–5.0)
Alkaline Phosphatase: 37 U/L — ABNORMAL LOW (ref 38–126)
Anion gap: 9 (ref 5–15)
BUN: 5 mg/dL — ABNORMAL LOW (ref 8–23)
CO2: 27 mmol/L (ref 22–32)
Calcium: 9.6 mg/dL (ref 8.9–10.3)
Chloride: 100 mmol/L (ref 98–111)
Creatinine, Ser: 0.47 mg/dL (ref 0.44–1.00)
GFR, Estimated: >60 mL/min (ref >60)
Glucose, Bld: 174 mg/dL — ABNORMAL HIGH (ref 70–99)
Potassium: 3.4 mmol/L — ABNORMAL LOW (ref 3.5–5.1)
Sodium: 136 mmol/L (ref 135–145)
Total Bilirubin: 0.5 mg/dL (ref <1.2)
Total Protein: 5.6 g/dL — ABNORMAL LOW (ref 6.5–8.1)

## 2023-08-16 LAB — CBC
HCT: 27.4 % — ABNORMAL LOW (ref 36.0–46.0)
Hemoglobin: 8.5 g/dL — ABNORMAL LOW (ref 12.0–15.0)
MCH: 26 pg (ref 26.0–34.0)
MCHC: 31 g/dL (ref 30.0–36.0)
MCV: 83.8 fL (ref 80.0–100.0)
Platelets: 167 10*3/uL (ref 150–400)
RBC: 3.27 MIL/uL — ABNORMAL LOW (ref 3.87–5.11)
RDW: 16 % — ABNORMAL HIGH (ref 11.5–15.5)
WBC: 7.1 10*3/uL (ref 4.0–10.5)
nRBC: 0 % (ref 0.0–0.2)

## 2023-08-16 LAB — CBG MONITORING, ED: Glucose-Capillary: 112 mg/dL — ABNORMAL HIGH (ref 70–99)

## 2023-08-16 LAB — PROTIME-INR
INR: >10 (ref 0.8–1.2)
INR: 1 (ref 0.8–1.2)
Prothrombin Time: >90 s — ABNORMAL HIGH (ref 11.4–15.2)
Prothrombin Time: 13.3 s (ref 11.4–15.2)

## 2023-08-16 LAB — PREPARE RBC (CROSSMATCH)

## 2023-08-16 LAB — APTT: aPTT: >200 s (ref 24–36)

## 2023-08-16 LAB — HEMOGLOBIN AND HEMATOCRIT, BLOOD
HCT: 31.7 % — ABNORMAL LOW (ref 36.0–46.0)
Hemoglobin: 9.7 g/dL — ABNORMAL LOW (ref 12.0–15.0)

## 2023-08-16 LAB — GLUCOSE, CAPILLARY: Glucose-Capillary: 144 mg/dL — ABNORMAL HIGH (ref 70–99)

## 2023-08-16 MED ORDER — ACETAMINOPHEN 325 MG PO TABS: 650.0000 mg | ORAL_TABLET | Freq: Four times a day (QID) | ORAL | Status: DC | PRN

## 2023-08-16 MED ORDER — POTASSIUM CHLORIDE 20 MEQ PO PACK
40.0000 meq | PACK | Freq: Once | ORAL | Status: AC
  Administered 2023-08-16: 40 meq via ORAL
  Filled 2023-08-16: qty 2

## 2023-08-16 MED ORDER — IOHEXOL 350 MG/ML SOLN
100.0000 mL | Freq: Once | INTRAVENOUS | Status: AC | PRN
Start: 2023-08-16 — End: 2023-08-16
  Administered 2023-08-16: 100 mL via INTRAVENOUS

## 2023-08-16 MED ORDER — SODIUM CHLORIDE 0.9% IV SOLUTION: Freq: Once | INTRAVENOUS | Status: AC

## 2023-08-16 MED ORDER — CHLORHEXIDINE GLUCONATE CLOTH 2 % EX PADS
6.0000 | MEDICATED_PAD | Freq: Every day | CUTANEOUS | Status: AC
Start: 2023-08-17 — End: ?
  Administered 2023-08-17 – 2023-08-19 (×3): 6 via TOPICAL

## 2023-08-16 MED ORDER — POTASSIUM CHLORIDE CRYS ER 20 MEQ PO TBCR: 40.0000 meq | EXTENDED_RELEASE_TABLET | Freq: Once | ORAL | Status: DC

## 2023-08-16 MED ORDER — INSULIN ASPART 100 UNIT/ML IJ SOLN
0.0000 [IU] | INTRAMUSCULAR | Status: DC
  Administered 2023-08-16 – 2023-08-18 (×5): 1 [IU] via SUBCUTANEOUS

## 2023-08-16 MED ORDER — VITAMIN K1 10 MG/ML IJ SOLN
10.0000 mg | Freq: Once | INTRAVENOUS | Status: DC
  Filled 2023-08-16: qty 1

## 2023-08-16 MED ORDER — ACETAMINOPHEN 650 MG RE SUPP: 650.0000 mg | Freq: Four times a day (QID) | RECTAL | Status: DC | PRN

## 2023-08-16 MED ORDER — ONDANSETRON HCL 4 MG PO TABS
4.0000 mg | ORAL_TABLET | Freq: Four times a day (QID) | ORAL | Status: DC | PRN
Start: 2023-08-16 — End: 2023-08-19

## 2023-08-16 MED ORDER — ONDANSETRON HCL 4 MG/2ML IJ SOLN
4.0000 mg | Freq: Four times a day (QID) | INTRAMUSCULAR | Status: DC | PRN
  Administered 2023-08-16: 4 mg via INTRAVENOUS
  Filled 2023-08-16: qty 2

## 2023-08-16 NOTE — ED Provider Notes (Signed)
[place] [facility]MENT AT [facility] Provider Note   CSN: [id number] Arrival date & time: 08/16/23  1102     History  Chief Complaint  Patient presents with   Rectal Bleeding    Tracy Bonilla is a 72 y.o. female.  She has PMH of diabetes, hypertension..  As above for C. difficile colitis on 12/6 and discharged subsequently on the 16th, states she went home a progressively got better.  She states yesterday morning she had loose bowel movement with blood, states she had several bowel movements throughout the day each with blood that was decreasing amounts throughout the day so she wanted to see if it was going to stop on its own but this morning woke up with blood in her follow-up, has had multiple more episodes of bloody stool though states when she wipes it is "pure blood".  She is not on blood thinners.  No abdominal pain chest pain or shortness of breath   Rectal Bleeding      Home Medications Prior to Admission medications   Medication Sig Start Date End Date Taking? Authorizing Provider  acidophilus (RISAQUAD) CAPS capsule Take 2 capsules by mouth 3 (three) times daily. 08/06/23  Yes Vassie Loll, MD  atorvastatin (LIPITOR) 20 MG tablet Take 20 mg by mouth at bedtime.   Yes [provider]  Cholecalciferol (VITAMIN D-3 PO) Take 1 tablet by mouth at bedtime.   Yes [provider]  gabapentin (NEURONTIN) 100 MG capsule Take 100-300 mg by mouth See admin instructions. Take 100 mg (1 capsule) at breakfast, 100 mg at noon, 300 mg (3 capsules) at bedtime. 07/18/23  Yes [provider]  loratadine (CLARITIN) 10 MG tablet Take 10 mg by mouth daily.   Yes [provider]  losartan (COZAAR) 50 MG tablet Take 50 mg by mouth at bedtime.   Yes [provider]  metFORMIN (GLUCOPHAGE-XR) 500 MG 24 hr tablet Take 1 tablet (500 mg total) by mouth daily with breakfast. 08/06/23  Yes Vassie Loll, MD  metoprolol tartrate  (LOPRESSOR) 25 MG tablet Take 0.5 tablets (12.5 mg total) by mouth 2 (two) times daily. 08/06/23  Yes Vassie Loll, MD  ondansetron (ZOFRAN-ODT) 4 MG disintegrating tablet Take 4-8 mg by mouth every 6 (six) hours as needed for nausea or vomiting. 07/25/23  Yes [provider]  potassium chloride (KLOR-CON) 20 MEQ packet Take 20 mEq by mouth daily for 10 days. 08/07/23 08/17/23 Yes Vassie Loll, MD  vancomycin (VANCOCIN) 125 MG capsule Take 1 capsule (125 mg total) by mouth 4 (four) times daily for 10 days. 08/06/23 08/16/23 Yes Vassie Loll, MD  glimepiride (AMARYL) 4 MG tablet Take 4 mg by mouth 2 (two) times daily. Patient not taking: Reported on 08/16/2023 04/26/23   [provider]  hydrochlorothiazide (HYDRODIURIL) 25 MG tablet Take 25 mg by mouth daily with lunch.  Patient not taking: Reported on 08/16/2023 02/09/14   [provider]      Allergies    Patient has no known allergies.    Review of Systems   Review of Systems  Gastrointestinal:  Positive for hematochezia.    Physical Exam Updated Vital Signs BP (!) 163/71   Pulse 95   Temp 98.4 F (36.9 C) (Oral)   Resp 18   Ht 5\' 3"  (1.6 m)   Wt 79.8 kg   LMP 08/21/1988   SpO2 97%   BMI 31.18 kg/m  Physical Exam Vitals and nursing note reviewed.  Constitutional:  General: She is not in acute distress.    Appearance: She is well-developed.     Comments: Patient appears pale  HENT:     Head: Normocephalic and atraumatic.     Mouth/Throat:     Mouth: Mucous membranes are moist.  Eyes:     Comments: Conjunctiva are pale  Cardiovascular:     Rate and Rhythm: Normal rate and regular rhythm.     Heart sounds: No murmur heard. Pulmonary:     Effort: Pulmonary effort is normal. No respiratory distress.     Breath sounds: Normal breath sounds.  Abdominal:     Palpations: Abdomen is soft.     Tenderness: There is no abdominal tenderness. There is no guarding or rebound.  Musculoskeletal:         General: No swelling.     Cervical back: Neck supple.  Skin:    General: Skin is warm and dry.     Capillary Refill: Capillary refill takes less than 2 seconds.  Neurological:     General: No focal deficit present.     Mental Status: She is alert and oriented to person, place, and time.  Psychiatric:        Mood and Affect: Mood normal.     ED Results / Procedures / Treatments   Labs (all labs ordered are listed, but only abnormal results are displayed) Labs Reviewed  COMPREHENSIVE METABOLIC PANEL - Abnormal; Notable for the following components:      Result Value   Potassium 3.4 (*)    Glucose, Bld 174 (*)    BUN 5 (*)    Total Protein 5.6 (*)    Albumin 2.9 (*)    Alkaline Phosphatase 37 (*)    All other components within normal limits  CBC - Abnormal; Notable for the following components:   RBC 3.27 (*)    Hemoglobin 8.5 (*)    HCT 27.4 (*)    RDW 16.0 (*)    All other components within normal limits  PROTIME-INR - Abnormal; Notable for the following components:   Prothrombin Time >90.0 (*)    INR >10.0 (*)    All other components within normal limits  APTT - Abnormal; Notable for the following components:   aPTT >200 (*)    All other components within normal limits  PROTIME-INR  POC OCCULT BLOOD, ED  TYPE AND SCREEN  PREPARE RBC (CROSSMATCH)    EKG None  Radiology No results found.  Procedures .Critical Care  Performed by: Ma Rings, PA-C Authorized by: Ma Rings, PA-C   Critical care provider statement:    Critical care time (minutes):  30   Critical care was necessary to treat or prevent imminent or life-threatening deterioration of the following conditions: Acute blood loss anemia due to rectal bleeding.   Critical care was time spent personally by me on the following activities:  Development of treatment plan with patient or surrogate, discussions with consultants, evaluation of patient's response to treatment, examination of  patient, ordering and review of laboratory studies, ordering and review of radiographic studies, ordering and performing treatments and interventions, pulse oximetry, re-evaluation of patient's condition and review of old charts   Care discussed with comment:  Dr. Levon Hedger of GI; Dr. Sherryll Burger hospitalist     Medications Ordered in ED Medications  phytonadione (VITAMIN K) 10 mg in dextrose 5 % 50 mL IVPB (has no administration in time range)  0.9 %  sodium chloride infusion (Manually program via Guardrails IV  Fluids) ( Intravenous New Bag/Given 08/16/23 1316)    ED Course/ Medical Decision Making/ A&P                                 Medical Decision Making This patient presents to the ED for concern of lower GI bleeding, this involves an extensive number of treatment options, and is a complaint that carries with it a high risk of complications and morbidity.  The differential diagnosis includes migraines, diverticulosis, colitis, other   Co morbidities that complicate the patient evaluation :   Recent C. difficile colitis   Additional history obtained:  Additional history obtained from EMR External records from outside source obtained and reviewed including prior notes and labs   Lab Tests:  I Ordered, and personally interpreted labs.  The pertinent results include: Hemoglobin 8.5 compared to 10 days ago was 12.4, CMP shows mild hypokalemia at 3.4, renal function is normal    Consultations Obtained:  I requested consultation with the on-call gastroenterologist Dr. Levon Hedger,  and discussed lab and imaging findings as well as pertinent plan - they recommend: Clear liquid diet for now, admit to hospitalist, he will see the patient and likely do flex sigmoidoscopy tomorrow; also consulted with on-call hospitalist Dr. Sherryll Burger to admit the patient.   Problem List / ED Course / Critical interventions / Medication management  Lower GI bleeding-patient had acute bleeding starting yesterday  which seemed to taper off but had large amount of blood "pouring out" this morning mixed with some stool.  No hemorrhoids or fissures on exam externally.  She does have some dried blood on her follow-up.  No recurrent bowel movements since arrival in the ED, no abdominal pain or tenderness on exam.  Patient is critically ill, slightly tachycardic with EMS per triage note and had to get IV fluids, heart rate in the 90s at rest but when patient moves in the bed at all including rolling over she is up to approximately 110 bpm, she appears to be pale and is feeling generally weak.  Given her significant drop in hemoglobin and bleeding acutely this morning I feel that her hemoglobin level here is likely lying behind her true value given her symptoms we will transfuse 1 unit of blood.  Discussed risks and benefits with patient and her family and they are agreeable with this   I have reviewed the patients home medicines and have made adjustments as needed   Amount and/or Complexity of Data Reviewed Labs: ordered.  Risk Prescription drug management. Decision regarding hospitalization.           Final Clinical Impression(s) / ED Diagnoses Final diagnoses:  Lower GI bleed    Rx / DC Orders ED Discharge Orders     None         Ma Rings, PA-C 08/16/23 1359    Cathren Laine, MD 08/16/23 1652

## 2023-08-16 NOTE — H&P (Signed)
History and Physical    ARRAYAH SOPP OZD:[id number] DOB: July 07, 1951 DOA: 08/16/2023  PCP: Elfredia Nevins, MD   Patient coming from: Home  Chief Complaint: Rectal bleed  HPI: Tracy Bonilla is a 72 y.o. female with medical history significant for hypertension, type 2 diabetes, dyslipidemia, and recent C. difficile colitis who presented to the ED with loose bloody bowel movement that began yesterday morning.  She states that she has had several bowel movements that were bloody in nature throughout the course of the day and was hoping that this would stop by this morning, but it persisted.  She denies any abdominal pain, chest pain, shortness of breath, or lightheadedness or dizziness.  She denies being on any blood thinners.   ED Course: Vital signs stable and patient afebrile.  Potassium 3.4 and hemoglobin 8.5.  INR greater than 10.  GI consulted and 1 unit PRBC ordered.  Review of Systems: Reviewed as noted above, otherwise negative.  Past Medical History:  Diagnosis Date   Arthritis    Complication of anesthesia    Diabetes mellitus without complication (HCC)    Hyperlipidemia    Hypertension    PONV (postoperative nausea and vomiting)     Past Surgical History:  Procedure Laterality Date   arthroscopic surgery Left    BREAST BIOPSY Left 1998   per pt benign   BREAST SURGERY Left    1998   cataract surgery     DILATION AND CURETTAGE OF UTERUS     extra digit removed     thumb   FOOT SURGERY Right    KNEE ARTHROPLASTY Right 05/28/2017   Procedure: COMPUTER ASSISTED TOTAL KNEE ARTHROPLASTY;  Surgeon: Samson Frederic, MD;  Location: [facility] OR;  Service: Orthopedics;  Laterality: Right;   REFRACTIVE SURGERY     both eyes   TOTAL KNEE ARTHROPLASTY Left 05/14/2015   Procedure: LEFT TOTAL KNEE ARTHROPLASTY;  Surgeon: Samson Frederic, MD;  Location: [facility] ORS;  Service: Orthopedics;  Laterality: Left;   TUBAL LIGATION       reports that she has never smoked. She has never used  smokeless tobacco. She reports that she does not drink alcohol and does not use drugs.  No Known Allergies  Family History  Problem Relation Age of Onset   Cancer Mother        liver   Hypertension Mother    Heart disease Mother        CVD   Cancer Father        lymphoma   Breast cancer Neg Hx     Prior to Admission medications   Medication Sig Start Date End Date Taking? Authorizing Provider  acidophilus (RISAQUAD) CAPS capsule Take 2 capsules by mouth 3 (three) times daily. 08/06/23  Yes Vassie Loll, MD  atorvastatin (LIPITOR) 20 MG tablet Take 20 mg by mouth at bedtime.   Yes [provider]  Cholecalciferol (VITAMIN D-3 PO) Take 1 tablet by mouth at bedtime.   Yes [provider]  gabapentin (NEURONTIN) 100 MG capsule Take 100-300 mg by mouth See admin instructions. Take 100 mg (1 capsule) at breakfast, 100 mg at noon, 300 mg (3 capsules) at bedtime. 07/18/23  Yes [provider]  loratadine (CLARITIN) 10 MG tablet Take 10 mg by mouth daily.   Yes [provider]  losartan (COZAAR) 50 MG tablet Take 50 mg by mouth at bedtime.   Yes [provider]  metFORMIN (GLUCOPHAGE-XR) 500 MG 24 hr tablet Take 1 tablet (500  mg total) by mouth daily with breakfast. 08/06/23  Yes Vassie Loll, MD  metoprolol tartrate (LOPRESSOR) 25 MG tablet Take 0.5 tablets (12.5 mg total) by mouth 2 (two) times daily. 08/06/23  Yes Vassie Loll, MD  ondansetron (ZOFRAN-ODT) 4 MG disintegrating tablet Take 4-8 mg by mouth every 6 (six) hours as needed for nausea or vomiting. 07/25/23  Yes [provider]  potassium chloride (KLOR-CON) 20 MEQ packet Take 20 mEq by mouth daily for 10 days. 08/07/23 08/17/23 Yes Vassie Loll, MD  vancomycin (VANCOCIN) 125 MG capsule Take 1 capsule (125 mg total) by mouth 4 (four) times daily for 10 days. 08/06/23 08/16/23 Yes Vassie Loll, MD  glimepiride (AMARYL) 4 MG tablet Take 4 mg by mouth 2 (two) times  daily. Patient not taking: Reported on 08/16/2023 04/26/23   [provider]  hydrochlorothiazide (HYDRODIURIL) 25 MG tablet Take 25 mg by mouth daily with lunch.  Patient not taking: Reported on 08/16/2023 02/09/14   [provider]    Physical Exam: Vitals:   08/16/23 1317 08/16/23 1330 08/16/23 1333 08/16/23 1400  BP: (!) 155/70 (!) 163/71 (!) 163/71 (!) 147/89  Pulse: 93 99 95 94  Resp: 16 19 18    Temp: 98.4 F (36.9 C)  98.4 F (36.9 C)   TempSrc: Oral  Oral   SpO2: 96% 96% 97% 97%  Weight:      Height:        Constitutional: NAD, calm, comfortable Vitals:   08/16/23 1317 08/16/23 1330 08/16/23 1333 08/16/23 1400  BP: (!) 155/70 (!) 163/71 (!) 163/71 (!) 147/89  Pulse: 93 99 95 94  Resp: 16 19 18    Temp: 98.4 F (36.9 C)  98.4 F (36.9 C)   TempSrc: Oral  Oral   SpO2: 96% 96% 97% 97%  Weight:      Height:       Eyes: lids and conjunctivae normal Neck: normal, supple Respiratory: clear to auscultation bilaterally. Normal respiratory effort. No accessory muscle use.  Cardiovascular: Regular rate and rhythm, no murmurs. Abdomen: no tenderness, no distention. Bowel sounds positive.  Musculoskeletal:  No edema. Skin: no rashes, lesions, ulcers.  Psychiatric: Flat affect  Labs on Admission: I have personally reviewed following labs and imaging studies  CBC: Recent Labs  Lab 08/16/23 1208  WBC 7.1  HGB 8.5*  HCT 27.4*  MCV 83.8  PLT 167   Basic Metabolic Panel: Recent Labs  Lab 08/16/23 1208  NA 136  K 3.4*  CL 100  CO2 27  GLUCOSE 174*  BUN 5*  CREATININE 0.47  CALCIUM 9.6   GFR: Estimated Creatinine Clearance: 64.6 mL/min (by C-G formula based on SCr of 0.47 mg/dL). Liver Function Tests: Recent Labs  Lab 08/16/23 1208  AST 19  ALT 16  ALKPHOS 37*  BILITOT 0.5  PROT 5.6*  ALBUMIN 2.9*   No results for input(s): "LIPASE", "AMYLASE" in the last 168 hours. No results for input(s): "AMMONIA" in the last 168  hours. Coagulation Profile: Recent Labs  Lab 08/16/23 1208  INR >10.0*   Cardiac Enzymes: No results for input(s): "CKTOTAL", "CKMB", "CKMBINDEX", "TROPONINI" in the last 168 hours. BNP (last 3 results) No results for input(s): "PROBNP" in the last 8760 hours. HbA1C: No results for input(s): "HGBA1C" in the last 72 hours. CBG: No results for input(s): "GLUCAP" in the last 168 hours. Lipid Profile: No results for input(s): "CHOL", "HDL", "LDLCALC", "TRIG", "CHOLHDL", "LDLDIRECT" in the last 72 hours. Thyroid Function Tests: No results for input(s): "TSH", "  T4TOTAL", "FREET4", "T3FREE", "THYROIDAB" in the last 72 hours. Anemia Panel: No results for input(s): "VITAMINB12", "FOLATE", "FERRITIN", "TIBC", "IRON", "RETICCTPCT" in the last 72 hours. Urine analysis:    Component Value Date/Time   COLORURINE YELLOW 07/27/2023 1500   APPEARANCEUR HAZY (A) 07/27/2023 1500   LABSPEC 1.017 07/27/2023 1500   PHURINE 5.0 07/27/2023 1500   GLUCOSEU NEGATIVE 07/27/2023 1500   HGBUR NEGATIVE 07/27/2023 1500   BILIRUBINUR NEGATIVE 07/27/2023 1500   KETONESUR 5 (A) 07/27/2023 1500   PROTEINUR 30 (A) 07/27/2023 1500   UROBILINOGEN 0.2 05/06/2015 1349   NITRITE NEGATIVE 07/27/2023 1500   LEUKOCYTESUR NEGATIVE 07/27/2023 1500    Radiological Exams on Admission: No results found.   Assessment/Plan Principal Problem:   Acute blood loss anemia Active Problems:   Essential hypertension   Type 2 diabetes mellitus (HCC)   Hypokalemia    Acute blood loss anemia secondary to GI bleed -Hemoglobin noted to be around 12-14, 10 days ago and now is 8.5 -1 unit PRBC transfusion ordered -PPI IV twice daily -CT angio per GI -Reversal of INR as below -H&H every 6 hours -N.p.o. for now  Supratherapeutic INR -Repeat PT/INR -Reversal with vitamin K ordered -Continue to monitor closely  Recent C. difficile colitis -Recent discharge 12/16 -3 doses of IV vancomycin per GI ordered -Maintain on  enteric precautions  Mild hypokalemia -Replete IV and continue to monitor  Hypertension -Hold blood pressure agents given ongoing severe bleed  Diabetes mellitus with mild hyperglycemia -SSI every 4 hours with close monitoring  Obesity class I -BMI 31.18   DVT prophylaxis: SCDs Code Status: Full Family Communication: Multiple family at bedside 12/26 Disposition Plan:Admit for evaluation of GI bleed Consults called:GI Admission status:Inpatient, SDU   Severity of Illness: The appropriate patient status for this patient is INPATIENT. Inpatient status is judged to be reasonable and necessary in order to provide the required intensity of service to ensure the patient's safety. The patient's presenting symptoms, physical exam findings, and initial radiographic and laboratory data in the context of their chronic comorbidities is felt to place them at high risk for further clinical deterioration. Furthermore, it is not anticipated that the patient will be medically stable for discharge from the hospital within 2 midnights of admission.   * I certify that at the point of admission it is my clinical judgment that the patient will require inpatient hospital care spanning beyond 2 midnights from the point of admission due to high intensity of service, high risk for further deterioration and high frequency of surveillance required.*   Jacquelyn Antony D Meloni Hinz DO [facility]  If 7PM-7AM, please contact night-coverage [email or web address]  08/16/2023, 2:10 PM

## 2023-08-16 NOTE — TOC CM/SW Note (Signed)
Transition of Care [facility]) - Inpatient Brief Assessment   Patient Details  Name: Tracy Bonilla MRN: [id number] Date of Birth: [birth year]  Transition of Care [facility]) CM/SW Contact:    Villa Herb, LCSWA Phone Number: 08/16/2023, 3:11 PM   Clinical Narrative: Pt recently discharged 12/16 with [facility] services.  Transition of Care Department [facility]) has reviewed patient and no TOC needs have been identified at this time. We will continue to monitor patient advancement through interdisciplinary progression rounds. If new patient transition needs arise, please place a TOC consult.  Transition of Care Asessment: Insurance and Status: Insurance coverage has been reviewed Patient has primary care physician: Yes Home environment has been reviewed: From home Prior level of function:: Independent Prior/Current Home Services: No current home services Social Drivers of Health Review: SDOH reviewed no interventions necessary Readmission risk has been reviewed: Yes Transition of care needs: no transition of care needs at this time

## 2023-08-16 NOTE — Consult Note (Addendum)
Gastroenterology Consult   Referring Provider: No ref. provider found Primary Care Physician:  Elfredia Nevins, MD Primary Gastroenterologist: Dolores Frame, MD (previously unassigned)  Patient ID: Tracy Bonilla; [id number]; [birth year]   Admit date: 08/16/2023  LOS: 0 days   Date of Consultation: 08/16/2023  Reason for Consultation: Rectal bleeding, acute blood loss anemia  History of Present Illness   Tracy Bonilla is a 72 y.o. year old female with medical history of HTN, type 2 diabetes, umbilical hernia, HLD, and recent C. difficile colitis treated with vancomycin who presented to the ED for complaint of rectal bleeding and was also found to be tachycardic initially.  GI consulted for evaluation of acute blood loss anemia in the setting of rectal bleeding.  ED Course: Hemoglobin 8.5 (down from 12.4 on recent discharge), MCV 83.8, platelets 167.  Glucose 174, potassium 3.4.  Normal LFTs.  INR in process.  Hemoccult to be collected Transfusion ordered by EDP  Consult:  Patient reports rectal bleeding began yesterday.  She had about 3-4 small episodes yesterday which she felt initially was related to hemorrhoids.  Overnight and this morning she would get up to go to the bathroom and would immediately defecate blood.  These bloody bowel movements do not include any stool.  Patient and husband both state that initially blood was some bright red but has been slightly dark/maroon-colored.  Denies any overt melena.  She denies any alcohol use, NSAID use.  She has been taking her medications as prescribed.  Took a dose of vancomycin this morning.  She states for the last several days she has been having semiformed/soft stools, no hard stools.  Mostly overnight and this morning she has not had any stool with her bowel movements.  She denies any abdominal pain but has had some nausea yesterday.  Overall eating very well and appetite is good.  She denies taking any blood thinners currently,  recently did finish up some Lovenox injections outpatient and has taken Eliquis in the past after knee surgeries.  She denies any chest pain or shortness of breath but she does report lower extremity swelling.   Patient with recent hospitalization 07/27/2023 - 08/06/2023 at [facility] in which she was having frequent watery stools.  She presented with findings of pancolitis on CT scan.  She also had marked mural thickening and ascites with C. difficile suspected to be the source.  Also with mild mural thickening of the appendiceal base, likely reactive.  Mildly distended gallbladder with layering hyperattenuation which may represent stones or sludge.  She was treated with hydration and was sent home on oral vancomycin with 10-day treatment for C. Difficile colitis.   No colonoscopy on file.  Patient reports she has never had a colonoscopy given she has never had real issues with her bowel movements or any concerns to warrant a colonoscopy.  She does report fear of being able to drink prep due to nausea given the large volume.   Past Medical History:  Diagnosis Date   Arthritis    Complication of anesthesia    Diabetes mellitus without complication (HCC)    Hyperlipidemia    Hypertension    PONV (postoperative nausea and vomiting)     Past Surgical History:  Procedure Laterality Date   arthroscopic surgery Left    BREAST BIOPSY Left 1998   per pt benign   BREAST SURGERY Left    1998   cataract surgery     DILATION AND CURETTAGE OF UTERUS  extra digit removed     thumb   FOOT SURGERY Right    KNEE ARTHROPLASTY Right 05/28/2017   Procedure: COMPUTER ASSISTED TOTAL KNEE ARTHROPLASTY;  Surgeon: Samson Frederic, MD;  Location: [facility] OR;  Service: Orthopedics;  Laterality: Right;   REFRACTIVE SURGERY     both eyes   TOTAL KNEE ARTHROPLASTY Left 05/14/2015   Procedure: LEFT TOTAL KNEE ARTHROPLASTY;  Surgeon: Samson Frederic, MD;  Location: [facility] ORS;  Service: Orthopedics;  Laterality: Left;   TUBAL  LIGATION      Prior to Admission medications   Medication Sig Start Date End Date Taking? Authorizing Provider  acidophilus (RISAQUAD) CAPS capsule Take 2 capsules by mouth 3 (three) times daily. 08/06/23   Vassie Loll, MD  calcium citrate (CALCITRATE - DOSED IN MG ELEMENTAL CALCIUM) 950 (200 Ca) MG tablet Take 200 mg of elemental calcium by mouth daily. W/ vit D    [provider]  dimenhyDRINATE (DRAMAMINE) 50 MG tablet Take 25 mg by mouth every 8 (eight) hours as needed for dizziness.    [provider]  gabapentin (NEURONTIN) 100 MG capsule Take 100-300 mg by mouth 3 (three) times daily. 100mg  am, 100mg  noon, 300mg  qhs 07/18/23   [provider]  glimepiride (AMARYL) 4 MG tablet Take 4 mg by mouth 2 (two) times daily. 04/26/23   [provider]  hydrochlorothiazide (HYDRODIURIL) 25 MG tablet Take 25 mg by mouth daily with lunch.  02/09/14   [provider]  loratadine (CLARITIN) 10 MG tablet Take 10 mg by mouth daily.    [provider]  losartan (COZAAR) 50 MG tablet Take 50 mg by mouth 2 (two) times daily.     [provider]  metFORMIN (GLUCOPHAGE-XR) 500 MG 24 hr tablet Take 1 tablet (500 mg total) by mouth daily with breakfast. 08/06/23   Vassie Loll, MD  metoprolol tartrate (LOPRESSOR) 25 MG tablet Take 0.5 tablets (12.5 mg total) by mouth 2 (two) times daily. 08/06/23   Vassie Loll, MD  ondansetron (ZOFRAN-ODT) 4 MG disintegrating tablet Take 4-8 mg by mouth every 6 (six) hours as needed for nausea or vomiting. 07/25/23   [provider]  potassium chloride (KLOR-CON) 20 MEQ packet Take 20 mEq by mouth daily for 10 days. 08/07/23 08/17/23  Vassie Loll, MD  pravastatin (PRAVACHOL) 20 MG tablet Take 20 mg by mouth daily.    [provider]  vancomycin (VANCOCIN) 125 MG capsule Take 1 capsule (125 mg total) by mouth 4 (four) times daily for 10 days. 08/06/23 08/16/23  Vassie Loll, MD    Current  Facility-Administered Medications  Medication Dose Route Frequency Provider Last Rate Last Admin   0.9 %  sodium chloride infusion (Manually program via Guardrails IV Fluids)   Intravenous Once Carmel [place] A, PA-C       Current Outpatient Medications  Medication Sig Dispense Refill   acidophilus (RISAQUAD) CAPS capsule Take 2 capsules by mouth 3 (three) times daily. 180 capsule 0   calcium citrate (CALCITRATE - DOSED IN MG ELEMENTAL CALCIUM) 950 (200 Ca) MG tablet Take 200 mg of elemental calcium by mouth daily. W/ vit D     dimenhyDRINATE (DRAMAMINE) 50 MG tablet Take 25 mg by mouth every 8 (eight) hours as needed for dizziness.     gabapentin (NEURONTIN) 100 MG capsule Take 100-300 mg by mouth 3 (three) times daily. 100mg  am, 100mg  noon, 300mg  qhs     glimepiride (AMARYL) 4 MG tablet Take 4 mg by mouth 2 (two)  times daily.     hydrochlorothiazide (HYDRODIURIL) 25 MG tablet Take 25 mg by mouth daily with lunch.      loratadine (CLARITIN) 10 MG tablet Take 10 mg by mouth daily.     losartan (COZAAR) 50 MG tablet Take 50 mg by mouth 2 (two) times daily.      metFORMIN (GLUCOPHAGE-XR) 500 MG 24 hr tablet Take 1 tablet (500 mg total) by mouth daily with breakfast.     metoprolol tartrate (LOPRESSOR) 25 MG tablet Take 0.5 tablets (12.5 mg total) by mouth 2 (two) times daily. 60 tablet 1   ondansetron (ZOFRAN-ODT) 4 MG disintegrating tablet Take 4-8 mg by mouth every 6 (six) hours as needed for nausea or vomiting.     potassium chloride (KLOR-CON) 20 MEQ packet Take 20 mEq by mouth daily for 10 days. 10 packet 0   pravastatin (PRAVACHOL) 20 MG tablet Take 20 mg by mouth daily.     vancomycin (VANCOCIN) 125 MG capsule Take 1 capsule (125 mg total) by mouth 4 (four) times daily for 10 days. 40 capsule 0    Allergies as of 08/16/2023   (No Known Allergies)    Family History  Problem Relation Age of Onset   Cancer Mother        liver   Hypertension Mother    Heart disease Mother        CVD    Cancer Father        lymphoma   Breast cancer Neg Hx     Social History   Socioeconomic History   Marital status: Married    Spouse name: Not on file   Number of children: Not on file   Years of education: Not on file   Highest education level: Not on file  Occupational History   Not on file  Tobacco Use   Smoking status: Never   Smokeless tobacco: Never  Vaping Use   Vaping status: Never Used  Substance and Sexual Activity   Alcohol use: No   Drug use: No   Sexual activity: Not Currently    Partners: Male    Birth control/protection: Surgical    Comment: btl  Other Topics Concern   Not on file  Social History Narrative   Not on file   Social Drivers of Health   Financial Resource Strain: Not on file  Food Insecurity: No Food Insecurity (07/27/2023)   Hunger Vital Sign    Worried About Running Out of Food in the Last Year: Never true    Ran Out of Food in the Last Year: Never true  Transportation Needs: No Transportation Needs (07/27/2023)   PRAPARE - Administrator, Civil Service (Medical): No    Lack of Transportation (Non-Medical): No  Physical Activity: Not on file  Stress: Not on file  Social Connections: Not on file  Intimate Partner Violence: Not At Risk (07/27/2023)   Humiliation, Afraid, Rape, and Kick questionnaire    Fear of Current or Ex-Partner: No    Emotionally Abused: No    Physically Abused: No    Sexually Abused: No     Review of Systems   Gen: + weight loss during prior hospitalization. Denies any fever, chills, loss of appetite. CV: Denies chest pain, heart palpitations, syncope, edema  Resp: Denies shortness of breath with rest, cough, wheezing, coughing up blood, and pleurisy. GI: see HPI GU : Denies urinary burning, blood in urine, urinary frequency, and urinary incontinence. MS: Denies joint pain, limitation of  movement, swelling, cramps, and atrophy.  Derm: Denies rash, itching, dry skin, hives. Psych: Denies  depression, anxiety, memory loss, hallucinations, and confusion. Heme: Denies bruising or bleeding Neuro:  Denies any headaches, dizziness, paresthesias, shaking  Physical Exam   Vital Signs in last 24 hours: Temp:  [99.2 F (37.3 C)] 99.2 F (37.3 C) (12/26 1121) Pulse Rate:  [91] 91 (12/26 1121) Resp:  [14] 14 (12/26 1121) BP: (158)/(68) 158/68 (12/26 1121) SpO2:  [95 %] 95 % (12/26 1121) Weight:  [79.8 kg] 79.8 kg (12/26 1120)    General:   Alert, pleasant and cooperative in NAD Head:  Normocephalic and atraumatic. Eyes:  Sclera clear, no icterus.   Conjunctiva pink. Ears:  Normal auditory acuity. Lungs:  Clear throughout to auscultation.   No wheezes, crackles, or rhonchi. No acute distress. Heart:  Regular rate and rhythm; no murmurs, clicks, rubs,  or gallops. Abdomen:  Soft, nontender and rounded, mildly distended, fluid filled. Umbilical hernia noted. No masses, hepatosplenomegaly. Normal bowel sounds, without guarding, and without rebound.   Rectal: weak external sphincter tone. Gross maroon colored blood on exam. No evidence of mass noted.    Msk:  Symmetrical without gross deformities. Normal posture. Extremities:  +3 pitting edema to bilateral lower extremities.  Neurologic:  Alert and  oriented x4. Skin:  Intact without significant lesions or rashes. Psych:  Alert and cooperative. Normal mood and affect.  Intake/Output from previous day: No intake/output data recorded. Intake/Output this shift: Total I/O In: 250 [I.V.:250] Out: -    Labs/Studies   Recent Labs Recent Labs    08/16/23 1208  WBC 7.1  HGB 8.5*  HCT 27.4*  PLT 167   BMET Recent Labs    08/16/23 1208  NA 136  K 3.4*  CL 100  CO2 27  GLUCOSE 174*  BUN 5*  CREATININE 0.47  CALCIUM 9.6   LFT Recent Labs    08/16/23 1208  PROT 5.6*  ALBUMIN 2.9*  AST 19  ALT 16  ALKPHOS 37*  BILITOT 0.5   PT/INR No results for input(s): "LABPROT", "INR" in the last 72 hours. Hepatitis  Panel No results for input(s): "HEPBSAG", "HCVAB", "HEPAIGM", "HEPBIGM" in the last 72 hours. C-Diff No results for input(s): "CDIFFTOX" in the last 72 hours.  Radiology/Studies No results found.   Assessment   SLOANE HUDDLESTON is a 72 y.o. year old female history of HTN, type 2 diabetes, umbilical hernia, HLD, and recent C. difficile colitis treated with vancomycin who presented to the ED for complaint of rectal bleeding and was also found to be tachycardic initially.  GI consulted for evaluation of acute blood loss anemia in the setting of rectal bleeding.  Rectal bleeding: Given the nature of her symptoms, likely suspect diverticular bleed but in the setting of recent colitis bleeding could be secondary to this as well as potential for bleeding vessel.  Patient has never had a colonoscopy.  Rectal exam today without mass but weakened rectal tone and overt blood present on exam without stool.  Given she does not have any abdominal pain, ischemia less likely but unable to rule this out in the setting of recent C. difficile colitis. Although suspect diverticular bleed, given she has had a large drop in hemoglobin we will evaluate with a CT angio of the abdomen pelvis first and likely will plan for potential colonoscopy versus flexible sigmoidoscopy inpatient.  Will need to trend H/H, advised patient that she may require additional blood transfusion given she is continuing  to have bleeding.  I discussed this plan with the husband and patient in depth today while in the ED. I have discussed the risks, alternatives, benefits with regards to but not limited to the risk of reaction to medication, bleeding, infection, perforation and the patient is agreeable to proceed. Written consent to be obtained.  Recent Cdiff colitis: Evidence of mural wall thickening and pancolitis on CT abdomen pelvis during previous admission.  She was initially started on IV ceftriaxone and Flagyl and then transitioned to p.o.  vancomycin after stool studies revealed positive C. difficile toxin and antigen.  She was treated outpatient as well as discharged with vancomycin p.o. for 10 days.  Plan / Recommendations    CTA A/P Continue 3 doses of vancomycin to complete full 10-day course for C. difficile treatment If no evidence of active bleed on CTA then she can have clear liquids NPO at midnight for potential colonoscopy vs flexible sigmoidoscopy.  Trend H/H, transfuse for hgb <7 Follow up INR  Addendum: INR resulted, is elevated at >10.  Plan to repeat to check for accuracy as patient denies being on any recent blood thinners and no prior evidence of vitamin K deficiency.  Hospitalist has plans to give vitamin K.  Our team will follow-up CTA results and if INR is not elevated then we will potentially plan for procedure tomorrow, otherwise we will treat a may need further hematologic workup.    08/16/2023, 12:50 PM  Brooke Bonito, MSN, FNP-BC, AGACNP-BC [facility]

## 2023-08-16 NOTE — ED Triage Notes (Addendum)
Pt BIB ems for rectal bleeding. Pt recently admitted with c-dif. Pt notice it rushes out when trying to get up from toilet. Pt states she could not tell if it was a hemorrhoid or not. Pt received 250 mL of fluid for tachycardia with fluid down to 90s HR.

## 2023-08-16 NOTE — H&P (View-Only) (Signed)
 Gastroenterology Consult   Referring Provider: No ref. provider found Primary Care Physician:  Elfredia Nevins, MD Primary Gastroenterologist: Dolores Frame, MD (previously unassigned)  Patient ID: Tracy Bonilla; [id number]; [birth year]   Admit date: 08/16/2023  LOS: 0 days   Date of Consultation: 08/16/2023  Reason for Consultation: Rectal bleeding, acute blood loss anemia  History of Present Illness   Tracy Bonilla is a 72 y.o. year old female with medical history of HTN, type 2 diabetes, umbilical hernia, HLD, and recent C. difficile colitis treated with vancomycin who presented to the ED for complaint of rectal bleeding and was also found to be tachycardic initially.  GI consulted for evaluation of acute blood loss anemia in the setting of rectal bleeding.  ED Course: Hemoglobin 8.5 (down from 12.4 on recent discharge), MCV 83.8, platelets 167.  Glucose 174, potassium 3.4.  Normal LFTs.  INR in process.  Hemoccult to be collected Transfusion ordered by EDP  Consult:  Patient reports rectal bleeding began yesterday.  She had about 3-4 small episodes yesterday which she felt initially was related to hemorrhoids.  Overnight and this morning she would get up to go to the bathroom and would immediately defecate blood.  These bloody bowel movements do not include any stool.  Patient and husband both state that initially blood was some bright red but has been slightly dark/maroon-colored.  Denies any overt melena.  She denies any alcohol use, NSAID use.  She has been taking her medications as prescribed.  Took a dose of vancomycin this morning.  She states for the last several days she has been having semiformed/soft stools, no hard stools.  Mostly overnight and this morning she has not had any stool with her bowel movements.  She denies any abdominal pain but has had some nausea yesterday.  Overall eating very well and appetite is good.  She denies taking any blood thinners currently,  recently did finish up some Lovenox injections outpatient and has taken Eliquis in the past after knee surgeries.  She denies any chest pain or shortness of breath but she does report lower extremity swelling.   Patient with recent hospitalization 07/27/2023 - 08/06/2023 at [facility] in which she was having frequent watery stools.  She presented with findings of pancolitis on CT scan.  She also had marked mural thickening and ascites with C. difficile suspected to be the source.  Also with mild mural thickening of the appendiceal base, likely reactive.  Mildly distended gallbladder with layering hyperattenuation which may represent stones or sludge.  She was treated with hydration and was sent home on oral vancomycin with 10-day treatment for C. Difficile colitis.   No colonoscopy on file.  Patient reports she has never had a colonoscopy given she has never had real issues with her bowel movements or any concerns to warrant a colonoscopy.  She does report fear of being able to drink prep due to nausea given the large volume.   Past Medical History:  Diagnosis Date   Arthritis    Complication of anesthesia    Diabetes mellitus without complication (HCC)    Hyperlipidemia    Hypertension    PONV (postoperative nausea and vomiting)     Past Surgical History:  Procedure Laterality Date   arthroscopic surgery Left    BREAST BIOPSY Left 1998   per pt benign   BREAST SURGERY Left    1998   cataract surgery     DILATION AND CURETTAGE OF UTERUS  extra digit removed     thumb   FOOT SURGERY Right    KNEE ARTHROPLASTY Right 05/28/2017   Procedure: COMPUTER ASSISTED TOTAL KNEE ARTHROPLASTY;  Surgeon: Samson Frederic, MD;  Location: [facility] OR;  Service: Orthopedics;  Laterality: Right;   REFRACTIVE SURGERY     both eyes   TOTAL KNEE ARTHROPLASTY Left 05/14/2015   Procedure: LEFT TOTAL KNEE ARTHROPLASTY;  Surgeon: Samson Frederic, MD;  Location: [facility] ORS;  Service: Orthopedics;  Laterality: Left;   TUBAL  LIGATION      Prior to Admission medications   Medication Sig Start Date End Date Taking? Authorizing Provider  acidophilus (RISAQUAD) CAPS capsule Take 2 capsules by mouth 3 (three) times daily. 08/06/23   Vassie Loll, MD  calcium citrate (CALCITRATE - DOSED IN MG ELEMENTAL CALCIUM) 950 (200 Ca) MG tablet Take 200 mg of elemental calcium by mouth daily. W/ vit D    [provider]  dimenhyDRINATE (DRAMAMINE) 50 MG tablet Take 25 mg by mouth every 8 (eight) hours as needed for dizziness.    [provider]  gabapentin (NEURONTIN) 100 MG capsule Take 100-300 mg by mouth 3 (three) times daily. 100mg  am, 100mg  noon, 300mg  qhs 07/18/23   [provider]  glimepiride (AMARYL) 4 MG tablet Take 4 mg by mouth 2 (two) times daily. 04/26/23   [provider]  hydrochlorothiazide (HYDRODIURIL) 25 MG tablet Take 25 mg by mouth daily with lunch.  02/09/14   [provider]  loratadine (CLARITIN) 10 MG tablet Take 10 mg by mouth daily.    [provider]  losartan (COZAAR) 50 MG tablet Take 50 mg by mouth 2 (two) times daily.     [provider]  metFORMIN (GLUCOPHAGE-XR) 500 MG 24 hr tablet Take 1 tablet (500 mg total) by mouth daily with breakfast. 08/06/23   Vassie Loll, MD  metoprolol tartrate (LOPRESSOR) 25 MG tablet Take 0.5 tablets (12.5 mg total) by mouth 2 (two) times daily. 08/06/23   Vassie Loll, MD  ondansetron (ZOFRAN-ODT) 4 MG disintegrating tablet Take 4-8 mg by mouth every 6 (six) hours as needed for nausea or vomiting. 07/25/23   [provider]  potassium chloride (KLOR-CON) 20 MEQ packet Take 20 mEq by mouth daily for 10 days. 08/07/23 08/17/23  Vassie Loll, MD  pravastatin (PRAVACHOL) 20 MG tablet Take 20 mg by mouth daily.    [provider]  vancomycin (VANCOCIN) 125 MG capsule Take 1 capsule (125 mg total) by mouth 4 (four) times daily for 10 days. 08/06/23 08/16/23  Vassie Loll, MD    Current  Facility-Administered Medications  Medication Dose Route Frequency Provider Last Rate Last Admin   0.9 %  sodium chloride infusion (Manually program via Guardrails IV Fluids)   Intravenous Once Carmel [place] A, PA-C       Current Outpatient Medications  Medication Sig Dispense Refill   acidophilus (RISAQUAD) CAPS capsule Take 2 capsules by mouth 3 (three) times daily. 180 capsule 0   calcium citrate (CALCITRATE - DOSED IN MG ELEMENTAL CALCIUM) 950 (200 Ca) MG tablet Take 200 mg of elemental calcium by mouth daily. W/ vit D     dimenhyDRINATE (DRAMAMINE) 50 MG tablet Take 25 mg by mouth every 8 (eight) hours as needed for dizziness.     gabapentin (NEURONTIN) 100 MG capsule Take 100-300 mg by mouth 3 (three) times daily. 100mg  am, 100mg  noon, 300mg  qhs     glimepiride (AMARYL) 4 MG tablet Take 4 mg by mouth 2 (two)  times daily.     hydrochlorothiazide (HYDRODIURIL) 25 MG tablet Take 25 mg by mouth daily with lunch.      loratadine (CLARITIN) 10 MG tablet Take 10 mg by mouth daily.     losartan (COZAAR) 50 MG tablet Take 50 mg by mouth 2 (two) times daily.      metFORMIN (GLUCOPHAGE-XR) 500 MG 24 hr tablet Take 1 tablet (500 mg total) by mouth daily with breakfast.     metoprolol tartrate (LOPRESSOR) 25 MG tablet Take 0.5 tablets (12.5 mg total) by mouth 2 (two) times daily. 60 tablet 1   ondansetron (ZOFRAN-ODT) 4 MG disintegrating tablet Take 4-8 mg by mouth every 6 (six) hours as needed for nausea or vomiting.     potassium chloride (KLOR-CON) 20 MEQ packet Take 20 mEq by mouth daily for 10 days. 10 packet 0   pravastatin (PRAVACHOL) 20 MG tablet Take 20 mg by mouth daily.     vancomycin (VANCOCIN) 125 MG capsule Take 1 capsule (125 mg total) by mouth 4 (four) times daily for 10 days. 40 capsule 0    Allergies as of 08/16/2023   (No Known Allergies)    Family History  Problem Relation Age of Onset   Cancer Mother        liver   Hypertension Mother    Heart disease Mother        CVD    Cancer Father        lymphoma   Breast cancer Neg Hx     Social History   Socioeconomic History   Marital status: Married    Spouse name: Not on file   Number of children: Not on file   Years of education: Not on file   Highest education level: Not on file  Occupational History   Not on file  Tobacco Use   Smoking status: Never   Smokeless tobacco: Never  Vaping Use   Vaping status: Never Used  Substance and Sexual Activity   Alcohol use: No   Drug use: No   Sexual activity: Not Currently    Partners: Male    Birth control/protection: Surgical    Comment: btl  Other Topics Concern   Not on file  Social History Narrative   Not on file   Social Drivers of Health   Financial Resource Strain: Not on file  Food Insecurity: No Food Insecurity (07/27/2023)   Hunger Vital Sign    Worried About Running Out of Food in the Last Year: Never true    Ran Out of Food in the Last Year: Never true  Transportation Needs: No Transportation Needs (07/27/2023)   PRAPARE - Administrator, Civil Service (Medical): No    Lack of Transportation (Non-Medical): No  Physical Activity: Not on file  Stress: Not on file  Social Connections: Not on file  Intimate Partner Violence: Not At Risk (07/27/2023)   Humiliation, Afraid, Rape, and Kick questionnaire    Fear of Current or Ex-Partner: No    Emotionally Abused: No    Physically Abused: No    Sexually Abused: No     Review of Systems   Gen: + weight loss during prior hospitalization. Denies any fever, chills, loss of appetite. CV: Denies chest pain, heart palpitations, syncope, edema  Resp: Denies shortness of breath with rest, cough, wheezing, coughing up blood, and pleurisy. GI: see HPI GU : Denies urinary burning, blood in urine, urinary frequency, and urinary incontinence. MS: Denies joint pain, limitation of  movement, swelling, cramps, and atrophy.  Derm: Denies rash, itching, dry skin, hives. Psych: Denies  depression, anxiety, memory loss, hallucinations, and confusion. Heme: Denies bruising or bleeding Neuro:  Denies any headaches, dizziness, paresthesias, shaking  Physical Exam   Vital Signs in last 24 hours: Temp:  [99.2 F (37.3 C)] 99.2 F (37.3 C) (12/26 1121) Pulse Rate:  [91] 91 (12/26 1121) Resp:  [14] 14 (12/26 1121) BP: (158)/(68) 158/68 (12/26 1121) SpO2:  [95 %] 95 % (12/26 1121) Weight:  [79.8 kg] 79.8 kg (12/26 1120)    General:   Alert, pleasant and cooperative in NAD Head:  Normocephalic and atraumatic. Eyes:  Sclera clear, no icterus.   Conjunctiva pink. Ears:  Normal auditory acuity. Lungs:  Clear throughout to auscultation.   No wheezes, crackles, or rhonchi. No acute distress. Heart:  Regular rate and rhythm; no murmurs, clicks, rubs,  or gallops. Abdomen:  Soft, nontender and rounded, mildly distended, fluid filled. Umbilical hernia noted. No masses, hepatosplenomegaly. Normal bowel sounds, without guarding, and without rebound.   Rectal: weak external sphincter tone. Gross maroon colored blood on exam. No evidence of mass noted.    Msk:  Symmetrical without gross deformities. Normal posture. Extremities:  +3 pitting edema to bilateral lower extremities.  Neurologic:  Alert and  oriented x4. Skin:  Intact without significant lesions or rashes. Psych:  Alert and cooperative. Normal mood and affect.  Intake/Output from previous day: No intake/output data recorded. Intake/Output this shift: Total I/O In: 250 [I.V.:250] Out: -    Labs/Studies   Recent Labs Recent Labs    08/16/23 1208  WBC 7.1  HGB 8.5*  HCT 27.4*  PLT 167   BMET Recent Labs    08/16/23 1208  NA 136  K 3.4*  CL 100  CO2 27  GLUCOSE 174*  BUN 5*  CREATININE 0.47  CALCIUM 9.6   LFT Recent Labs    08/16/23 1208  PROT 5.6*  ALBUMIN 2.9*  AST 19  ALT 16  ALKPHOS 37*  BILITOT 0.5   PT/INR No results for input(s): "LABPROT", "INR" in the last 72 hours. Hepatitis  Panel No results for input(s): "HEPBSAG", "HCVAB", "HEPAIGM", "HEPBIGM" in the last 72 hours. C-Diff No results for input(s): "CDIFFTOX" in the last 72 hours.  Radiology/Studies No results found.   Assessment   Tracy Bonilla is a 72 y.o. year old female history of HTN, type 2 diabetes, umbilical hernia, HLD, and recent C. difficile colitis treated with vancomycin who presented to the ED for complaint of rectal bleeding and was also found to be tachycardic initially.  GI consulted for evaluation of acute blood loss anemia in the setting of rectal bleeding.  Rectal bleeding: Given the nature of her symptoms, likely suspect diverticular bleed but in the setting of recent colitis bleeding could be secondary to this as well as potential for bleeding vessel.  Patient has never had a colonoscopy.  Rectal exam today without mass but weakened rectal tone and overt blood present on exam without stool.  Given she does not have any abdominal pain, ischemia less likely but unable to rule this out in the setting of recent C. difficile colitis. Although suspect diverticular bleed, given she has had a large drop in hemoglobin we will evaluate with a CT angio of the abdomen pelvis first and likely will plan for potential colonoscopy versus flexible sigmoidoscopy inpatient.  Will need to trend H/H, advised patient that she may require additional blood transfusion given she is continuing  to have bleeding.  I discussed this plan with the husband and patient in depth today while in the ED. I have discussed the risks, alternatives, benefits with regards to but not limited to the risk of reaction to medication, bleeding, infection, perforation and the patient is agreeable to proceed. Written consent to be obtained.  Recent Cdiff colitis: Evidence of mural wall thickening and pancolitis on CT abdomen pelvis during previous admission.  She was initially started on IV ceftriaxone and Flagyl and then transitioned to p.o.  vancomycin after stool studies revealed positive C. difficile toxin and antigen.  She was treated outpatient as well as discharged with vancomycin p.o. for 10 days.  Plan / Recommendations    CTA A/P Continue 3 doses of vancomycin to complete full 10-day course for C. difficile treatment If no evidence of active bleed on CTA then she can have clear liquids NPO at midnight for potential colonoscopy vs flexible sigmoidoscopy.  Trend H/H, transfuse for hgb <7 Follow up INR  Addendum: INR resulted, is elevated at >10.  Plan to repeat to check for accuracy as patient denies being on any recent blood thinners and no prior evidence of vitamin K deficiency.  Hospitalist has plans to give vitamin K.  Our team will follow-up CTA results and if INR is not elevated then we will potentially plan for procedure tomorrow, otherwise we will treat a may need further hematologic workup.    08/16/2023, 12:50 PM  Brooke Bonito, MSN, FNP-BC, AGACNP-BC [facility]

## 2023-08-16 NOTE — ED Notes (Signed)
Date and time results received: 08/16/23 1337 (use smartphrase ".now" to insert current time)  Test: PT >90 INR >10 PTT >200 Critical Value:   Name of Provider Notified: Dr. Sherryll Burger  Orders Received? Or Actions Taken?:

## 2023-08-17 ENCOUNTER — Inpatient Hospital Stay (HOSPITAL_COMMUNITY): Payer: Medicare Other | Admitting: Anesthesiology

## 2023-08-17 ENCOUNTER — Encounter (HOSPITAL_COMMUNITY): Payer: Self-pay | Admitting: Internal Medicine

## 2023-08-17 ENCOUNTER — Telehealth: Payer: Self-pay | Admitting: Gastroenterology

## 2023-08-17 ENCOUNTER — Encounter (HOSPITAL_COMMUNITY)
Admission: EM | Disposition: A | Discharge status: Home / Self Care | Payer: Self-pay | Source: Home / Self Care | Attending: Internal Medicine

## 2023-08-17 DIAGNOSIS — K5989 Other specified functional intestinal disorders: Secondary | ICD-10-CM | POA: Diagnosis not present

## 2023-08-17 DIAGNOSIS — K644 Residual hemorrhoidal skin tags: Secondary | ICD-10-CM

## 2023-08-17 DIAGNOSIS — I10 Essential (primary) hypertension: Secondary | ICD-10-CM | POA: Diagnosis not present

## 2023-08-17 DIAGNOSIS — K6389 Other specified diseases of intestine: Secondary | ICD-10-CM | POA: Diagnosis not present

## 2023-08-17 DIAGNOSIS — D62 Acute posthemorrhagic anemia: Secondary | ICD-10-CM | POA: Diagnosis not present

## 2023-08-17 DIAGNOSIS — K922 Gastrointestinal hemorrhage, unspecified: Principal | ICD-10-CM

## 2023-08-17 DIAGNOSIS — K648 Other hemorrhoids: Secondary | ICD-10-CM | POA: Diagnosis not present

## 2023-08-17 HISTORY — PX: FLEXIBLE SIGMOIDOSCOPY: SHX5431

## 2023-08-17 HISTORY — PX: BIOPSY: SHX5522

## 2023-08-17 LAB — COMPREHENSIVE METABOLIC PANEL
ALT: 14 U/L (ref 0–44)
AST: 15 U/L (ref 15–41)
Albumin: 2.4 g/dL — ABNORMAL LOW (ref 3.5–5.0)
Alkaline Phosphatase: 31 U/L — ABNORMAL LOW (ref 38–126)
Anion gap: 7 (ref 5–15)
BUN: <5 mg/dL — ABNORMAL LOW (ref 8–23)
CO2: 28 mmol/L (ref 22–32)
Calcium: 8.5 mg/dL — ABNORMAL LOW (ref 8.9–10.3)
Chloride: 105 mmol/L (ref 98–111)
Creatinine, Ser: 0.31 mg/dL — ABNORMAL LOW (ref 0.44–1.00)
GFR, Estimated: >60 mL/min (ref >60)
Glucose, Bld: 102 mg/dL — ABNORMAL HIGH (ref 70–99)
Potassium: 2.9 mmol/L — ABNORMAL LOW (ref 3.5–5.1)
Sodium: 140 mmol/L (ref 135–145)
Total Bilirubin: 0.6 mg/dL (ref <1.2)
Total Protein: 4.5 g/dL — ABNORMAL LOW (ref 6.5–8.1)

## 2023-08-17 LAB — BPAM RBC
Blood Product Expiration Date: 202501152359
ISSUE DATE / TIME: 202412261255
Unit Type and Rh: 6200

## 2023-08-17 LAB — MRSA NEXT GEN BY PCR, NASAL: MRSA by PCR Next Gen: NOT DETECTED

## 2023-08-17 LAB — HEMOGLOBIN AND HEMATOCRIT, BLOOD
HCT: 28 % — ABNORMAL LOW (ref 36.0–46.0)
Hemoglobin: 9.1 g/dL — ABNORMAL LOW (ref 12.0–15.0)

## 2023-08-17 LAB — TYPE AND SCREEN
ABO/RH(D): A POS
Antibody Screen: NEGATIVE
Unit division: 0

## 2023-08-17 LAB — CBC
HCT: 25.9 % — ABNORMAL LOW (ref 36.0–46.0)
Hemoglobin: 8.4 g/dL — ABNORMAL LOW (ref 12.0–15.0)
MCH: 27 pg (ref 26.0–34.0)
MCHC: 32.4 g/dL (ref 30.0–36.0)
MCV: 83.3 fL (ref 80.0–100.0)
Platelets: 136 10*3/uL — ABNORMAL LOW (ref 150–400)
RBC: 3.11 MIL/uL — ABNORMAL LOW (ref 3.87–5.11)
RDW: 15.9 % — ABNORMAL HIGH (ref 11.5–15.5)
WBC: 4.8 10*3/uL (ref 4.0–10.5)
nRBC: 0 % (ref 0.0–0.2)

## 2023-08-17 LAB — GLUCOSE, CAPILLARY
Glucose-Capillary: 106 mg/dL — ABNORMAL HIGH (ref 70–99)
Glucose-Capillary: 109 mg/dL — ABNORMAL HIGH (ref 70–99)
Glucose-Capillary: 109 mg/dL — ABNORMAL HIGH (ref 70–99)
Glucose-Capillary: 111 mg/dL — ABNORMAL HIGH (ref 70–99)
Glucose-Capillary: 138 mg/dL — ABNORMAL HIGH (ref 70–99)
Glucose-Capillary: 144 mg/dL — ABNORMAL HIGH (ref 70–99)
Glucose-Capillary: 96 mg/dL (ref 70–99)

## 2023-08-17 LAB — MAGNESIUM: Magnesium: 1.6 mg/dL — ABNORMAL LOW (ref 1.7–2.4)

## 2023-08-17 SURGERY — SIGMOIDOSCOPY, FLEXIBLE
Anesthesia: General

## 2023-08-17 MED ORDER — MAGNESIUM SULFATE 2 GM/50ML IV SOLN
2.0000 g | Freq: Once | INTRAVENOUS | Status: AC
  Administered 2023-08-17: 2 g via INTRAVENOUS
  Filled 2023-08-17: qty 50

## 2023-08-17 MED ORDER — SODIUM CHLORIDE 0.9 % IV SOLN: INTRAVENOUS | Status: DC | PRN

## 2023-08-17 MED ORDER — METOPROLOL TARTRATE 25 MG PO TABS
12.5000 mg | ORAL_TABLET | Freq: Two times a day (BID) | ORAL | Status: DC
  Administered 2023-08-17 – 2023-08-19 (×4): 12.5 mg via ORAL
  Filled 2023-08-17 (×5): qty 1

## 2023-08-17 MED ORDER — LOSARTAN POTASSIUM 50 MG PO TABS
50.0000 mg | ORAL_TABLET | Freq: Every day | ORAL | Status: DC
  Administered 2023-08-17 – 2023-08-18 (×2): 50 mg via ORAL
  Filled 2023-08-17 (×2): qty 1

## 2023-08-17 MED ORDER — PROPOFOL 10 MG/ML IV BOLUS
INTRAVENOUS | Status: DC | PRN
  Administered 2023-08-17 (×2): 50 mg via INTRAVENOUS
  Administered 2023-08-17: 30 mg via INTRAVENOUS

## 2023-08-17 MED ORDER — POTASSIUM CHLORIDE 10 MEQ/100ML IV SOLN
10.0000 meq | INTRAVENOUS | Status: AC
  Administered 2023-08-17 (×3): 10 meq via INTRAVENOUS
  Filled 2023-08-17 (×5): qty 100

## 2023-08-17 MED ORDER — LIDOCAINE HCL 1 % IJ SOLN
INTRAMUSCULAR | Status: DC | PRN
  Administered 2023-08-17: 50 mg via INTRADERMAL

## 2023-08-17 MED ORDER — ATORVASTATIN CALCIUM 20 MG PO TABS
20.0000 mg | ORAL_TABLET | Freq: Every day | ORAL | Status: DC
  Administered 2023-08-17 – 2023-08-18 (×2): 20 mg via ORAL
  Filled 2023-08-17 (×2): qty 1

## 2023-08-17 MED ORDER — GABAPENTIN 100 MG PO CAPS
100.0000 mg | ORAL_CAPSULE | Freq: Two times a day (BID) | ORAL | Status: DC
  Administered 2023-08-17 – 2023-08-19 (×4): 100 mg via ORAL
  Filled 2023-08-17 (×4): qty 1

## 2023-08-17 MED ORDER — POTASSIUM CHLORIDE 10 MEQ/100ML IV SOLN
10.0000 meq | INTRAVENOUS | Status: AC
  Administered 2023-08-17 (×3): 10 meq via INTRAVENOUS
  Filled 2023-08-17: qty 100

## 2023-08-17 MED ORDER — GABAPENTIN 300 MG PO CAPS
300.0000 mg | ORAL_CAPSULE | Freq: Every day | ORAL | Status: DC
  Administered 2023-08-17 – 2023-08-18 (×2): 300 mg via ORAL
  Filled 2023-08-17 (×2): qty 1

## 2023-08-17 NOTE — Transfer of Care (Signed)
Immediate Anesthesia Transfer of Care Note  Patient: Tracy Bonilla  Procedure(s) Performed: FLEXIBLE SIGMOIDOSCOPY BIOPSY  Patient Location: PACU  Anesthesia Type:General  Level of Consciousness: awake  Airway & Oxygen Therapy: Patient Spontanous Breathing  Post-op Assessment: Report given to RN  Post vital signs: Reviewed and stable  Last Vitals:  Vitals Value Taken Time  BP 128/83 08/17/23 1135  Temp    Pulse 92 08/17/23 1138  Resp 15 08/17/23 1138  SpO2 99 % 08/17/23 1138  Vitals shown include unfiled device data.  Last Pain:  Vitals:   08/17/23 1106  TempSrc:   PainSc: 0-No pain      Patients Stated Pain Goal: 4 (08/17/23 1004)  Complications: No notable events documented.

## 2023-08-17 NOTE — Interval H&P Note (Signed)
History and Physical Interval Note:  08/17/2023 11:02 AM  Tracy Bonilla  has presented today for surgery, with the diagnosis of hematochezia.  The various methods of treatment have been discussed with the patient and family. After consideration of risks, benefits and other options for treatment, the patient has consented to  Procedure(s): FLEXIBLE SIGMOIDOSCOPY (N/A) as a surgical intervention.  The patient's history has been reviewed, patient examined, no change in status, stable for surgery.  I have reviewed the patient's chart and labs.  Questions were answered to the patient's satisfaction.    We will proceed with Flexible sigmoidoscopy as scheduled.    I thoroughly discussed with the patient the procedure, including the risks involved. Patient understands what the procedure involves including the benefits and any risks. Patient understands alternatives to the proposed procedure. Risks including (but not limited to) bleeding, tearing of the lining (perforation), rupture of adjacent organs, problems with heart and lung function, infection, and medication reactions. A small percentage of complications may require surgery, hospitalization, repeat endoscopic procedure, and/or transfusion.  Patient understood and agreed.   Juanetta Beets Tashai Catino

## 2023-08-17 NOTE — Telephone Encounter (Signed)
Mandy/Mitzi - Please make hospital follow up with myself or Dr. Levon Hedger in 2-3 weeks.   Appollonia Klee S - Needs CBC 1 week post discharge.   Brooke Bonito, MSN, APRN, FNP-BC, AGACNP-BC [facility]

## 2023-08-17 NOTE — Op Note (Signed)
[facility] Patient Name: Tracy Bonilla Procedure Date: 08/17/2023 11:01 AM MRN: [id number] Date of Birth: [birth year] Attending MD: Sanjuan Dame , MD, [id number] CSN: [id number] Age: 72 Admit Type: Inpatient Procedure:                Flexible Sigmoidoscopy Indications:              Hematochezia Providers:                Sanjuan Dame, MD, Buel Ream. Thomasena Edis RN, RN,                            Elinor Parkinson Referring MD:              Medicines:                Monitored Anesthesia Care Complications:            No immediate complications. Estimated Blood Loss:     Estimated blood loss: none. Procedure:                Pre-Anesthesia Assessment:                           - Prior to the procedure, a History and Physical                            was performed, and patient medications and                            allergies were reviewed. The patient's tolerance of                            previous anesthesia was also reviewed. The risks                            and benefits of the procedure and the sedation                            options and risks were discussed with the patient.                            All questions were answered, and informed consent                            was obtained. Prior Anticoagulants: The patient has                            taken no anticoagulant or antiplatelet agents. ASA                            Grade Assessment: III - A patient with severe                            systemic disease. After reviewing the risks and  benefits, the patient was deemed in satisfactory                            condition to undergo the procedure.                           After obtaining informed consent, the scope was                            passed under direct vision. The [id number]-[id number]                            ([id number]) scope was introduced through the anus and                            advanced to the the left transverse  colon. The                            flexible sigmoidoscopy was accomplished without                            difficulty. The patient tolerated the procedure                            well. The quality of the bowel preparation was                            adequate to identify polyps greater than 5 mm in                            size. Scope In: 11:20:23 AM Scope Out: 11:27:47 AM Total Procedure Duration: 0 hours 7 minutes 24 seconds  Findings:      There is no endoscopic evidence of bleeding or mass in the rectum, in       the sigmoid colon and in the distal Transverse Colon.      An area of mildly erythematous mucosa was found in the sigmoid colon.       Biopsies were taken with a cold forceps for histology.      Non-bleeding external and internal hemorrhoids were found. The       hemorrhoids were medium-sized. Impression:               - Erythematous mucosa in the sigmoid colon.                            Biopsied.                           - Non-bleeding external and internal hemorrhoids.                           - No evidence of old or active bleeding throughout                            the examined portion                           -  No evidence of pseudomembranous colitis Moderate Sedation:      Per Anesthesia Care Recommendation:           Can restart diet as tolerated                           Colonoscopy as outpatient in 4-6 weeks                           Patient to follow up in GI clinic Procedure Code(s):        --- Professional ---                           [phone], Sigmoidoscopy, flexible; with biopsy, single                            or multiple Diagnosis Code(s):        --- Professional ---                           K63.89, Other specified diseases of intestine                           K64.8, Other hemorrhoids                           K92.1, Melena (includes Hematochezia) CPT copyright 2022 American Medical Association. All rights reserved. The codes  documented in this report are preliminary and upon coder review may  be revised to meet current compliance requirements. Sanjuan Dame, MD Sanjuan Dame, MD 08/17/2023 11:38:47 AM This report has been signed electronically. Number of Addenda: 0

## 2023-08-17 NOTE — Anesthesia Preprocedure Evaluation (Signed)
Anesthesia Evaluation  Patient identified by MRN, date of birth, ID band Patient awake    Reviewed: Allergy & Precautions, H&P , NPO status , Patient's Chart, lab work & pertinent test results, reviewed documented beta blocker date and time   History of Anesthesia Complications (+) PONV and history of anesthetic complications  Airway Mallampati: II  TM Distance: >3 FB Neck ROM: full    Dental no notable dental hx. (+) Dental Advisory Given   Pulmonary neg pulmonary ROS   Pulmonary exam normal breath sounds clear to auscultation       Cardiovascular Exercise Tolerance: Good hypertension, Normal cardiovascular exam Rhythm:regular Rate:Normal     Neuro/Psych negative neurological ROS  negative psych ROS   GI/Hepatic negative GI ROS, Neg liver ROS,,,  Endo/Other  negative endocrine ROSdiabetes, Type 2    Renal/GU negative Renal ROS  negative genitourinary   Musculoskeletal  (+) Arthritis , Osteoarthritis,    Abdominal   Peds  Hematology  (+) Blood dyscrasia, anemia   Anesthesia Other Findings   Reproductive/Obstetrics negative OB ROS                             Anesthesia Physical Anesthesia Plan  ASA: 3  Anesthesia Plan: General   Post-op Pain Management: Minimal or no pain anticipated   Induction:   PONV Risk Score and Plan: Propofol infusion  Airway Management Planned: Nasal Cannula and Natural Airway  Additional Equipment: None  Intra-op Plan:   Post-operative Plan:   Informed Consent: I have reviewed the patients History and Physical, chart, labs and discussed the procedure including the risks, benefits and alternatives for the proposed anesthesia with the patient or authorized representative who has indicated his/her understanding and acceptance.     Dental Advisory Given  Plan Discussed with: CRNA  Anesthesia Plan Comments:        Anesthesia Quick  Evaluation

## 2023-08-17 NOTE — Progress Notes (Signed)
Patient underwent Flexible sigmoidoscopy with biopsies  under propofol sedation.  Tolerated the procedure adequately.   FINDINGS:  - Erythematous mucosa in the sigmoid colon. Biopsied. - Non- bleeding external and internal hemorrhoids. - No evidence of old or active bleeding throughout the examined portion - No evidence of pseudomembranous colitis  RECOMMENDATIONS  Can restart diet as tolerated  Colonoscopy as outpatient in 4- 6 weeks  Patient to follow up in GI clinic Please recall GI with any further questions  Vista Lawman, MD Gastroenterology and Hepatology [facility]

## 2023-08-17 NOTE — Anesthesia Postprocedure Evaluation (Signed)
Anesthesia Post Note  Patient: Tracy Bonilla  Procedure(s) Performed: FLEXIBLE SIGMOIDOSCOPY BIOPSY  Patient location during evaluation: PACU Anesthesia Type: General Level of consciousness: awake and alert Pain management: pain level controlled Vital Signs Assessment: post-procedure vital signs reviewed and stable Respiratory status: spontaneous breathing Cardiovascular status: blood pressure returned to baseline and stable Postop Assessment: no apparent nausea or vomiting Anesthetic complications: no   No notable events documented.   Last Vitals:  Vitals:   08/17/23 0800 08/17/23 1004  BP: (!) 153/57 (!) 151/66  Pulse: 82 90  Resp: 20 11  Temp:  36.6 C  SpO2: 96% 100%    Last Pain:  Vitals:   08/17/23 1106  TempSrc:   PainSc: 0-No pain                 Dashauna Heymann

## 2023-08-17 NOTE — Plan of Care (Signed)

## 2023-08-17 NOTE — Plan of Care (Signed)
  Problem: Education: Goal: Knowledge of General Education information will improve Description Including pain rating scale, medication(s)/side effects and non-pharmacologic comfort measures Outcome: Progressing   

## 2023-08-17 NOTE — Progress Notes (Signed)
PROGRESS NOTE    Tracy Bonilla  NWG:[id number] DOB: [birth year] DOA: 08/16/2023 PCP: Elfredia Nevins, MD   Brief Narrative:    Tracy Bonilla is a 72 y.o. female with medical history significant for hypertension, type 2 diabetes, dyslipidemia, and recent C. difficile colitis who presented to the ED with loose bloody bowel movement that began yesterday morning.  She states that she has had several bowel movements that were bloody in nature throughout the course of the day.  She was admitted for acute blood loss anemia secondary to GI bleed in the setting of recent C. difficile colitis.  She has undergone flex sigmoidoscopy on 12/27 with findings of hemorrhoids and no further bleeding noted.  Assessment & Plan:   Principal Problem:   Acute blood loss anemia Active Problems:   Essential hypertension   Type 2 diabetes mellitus (HCC)   Hypokalemia   Lower GI bleed  Assessment and Plan:  Acute blood loss anemia secondary to GI bleed -Hemoglobin noted to be around 12-14, 10 days ago and now is 8.5 -1 unit PRBC transfusion ordered -Hemoglobin levels currently stable -Resume diet per GI -Flex sigmoidoscopy 12/27 with internal and external hemorrhoids noted with no further bleeding   Recent C. difficile colitis -Recent discharge 12/16 - maintain on enteric precautions   Hypokalemia/hypomagnesemia -Replete IV and continue to monitor   Hypertension -Resume home blood pressure agents   Diabetes mellitus with mild hyperglycemia -SSI every 4 hours with close monitoring   Obesity class I -BMI 31.18    DVT prophylaxis:SCDs Code Status: Full Family Communication: None at bedside Disposition Plan:  Status is: Inpatient Remains inpatient appropriate because: Need for IV medications and close monitoring  Consultants:  GI  Procedures:  Flex sig 12/27  Antimicrobials:  None   Subjective: Patient seen and evaluated today with no new acute complaints or concerns. No acute concerns  or events noted overnight.  Hemoglobin levels have remained stable and no further acute bleeding noted overnight.  Objective: Vitals:   08/17/23 0800 08/17/23 1004 08/17/23 1135 08/17/23 1211  BP: (!) 153/57 (!) 151/66 128/83   Pulse: 82 90 99   Resp: 20 11 19    Temp:  97.8 F (36.6 C) 97.8 F (36.6 C) 97.8 F (36.6 C)  TempSrc:  Oral  Oral  SpO2: 96% 100% 95%   Weight:      Height:        Intake/Output Summary (Last 24 hours) at 08/17/2023 1254 Last data filed at 08/17/2023 1125 Gross per 24 hour  Intake 335.83 ml  Output 0 ml  Net 335.83 ml   Filed Weights   08/16/23 1120 08/16/23 1800 08/17/23 0600  Weight: 79.8 kg 83.1 kg 83.2 kg    Examination:  General exam: Appears calm and comfortable  Respiratory system: Clear to auscultation. Respiratory effort normal. Cardiovascular system: S1 & S2 heard, RRR.  Gastrointestinal system: Abdomen is soft Central nervous system: Alert and awake Extremities: No edema Skin: No significant lesions noted Psychiatry: Flat affect.    Data Reviewed: I have personally reviewed following labs and imaging studies  CBC: Recent Labs  Lab 08/16/23 1208 08/16/23 1847 08/17/23 0038 08/17/23 0645  WBC 7.1  --  4.8  --   HGB 8.5* 9.7* 8.4* 9.1*  HCT 27.4* 31.7* 25.9* 28.0*  MCV 83.8  --  83.3  --   PLT 167  --  136*  --    Basic Metabolic Panel: Recent Labs  Lab 08/16/23 1208 08/17/23 0038  NA  136 140  K 3.4* 2.9*  CL 100 105  CO2 27 28  GLUCOSE 174* 102*  BUN 5* <5*  CREATININE 0.47 0.31*  CALCIUM 9.6 8.5*  MG  --  1.6*   GFR: Estimated Creatinine Clearance: 68.7 mL/min (A) (by C-G formula based on SCr of 0.31 mg/dL (L)). Liver Function Tests: Recent Labs  Lab 08/16/23 1208 08/17/23 0038  AST 19 15  ALT 16 14  ALKPHOS 37* 31*  BILITOT 0.5 0.6  PROT 5.6* 4.5*  ALBUMIN 2.9* 2.4*   No results for input(s): "LIPASE", "AMYLASE" in the last 168 hours. No results for input(s): "AMMONIA" in the last 168  hours. Coagulation Profile: Recent Labs  Lab 08/16/23 1208 08/16/23 1419  INR >10.0* 1.0   Cardiac Enzymes: No results for input(s): "CKTOTAL", "CKMB", "CKMBINDEX", "TROPONINI" in the last 168 hours. BNP (last 3 results) No results for input(s): "PROBNP" in the last 8760 hours. HbA1C: No results for input(s): "HGBA1C" in the last 72 hours. CBG: Recent Labs  Lab 08/16/23 1955 08/17/23 0010 08/17/23 0326 08/17/23 0744 08/17/23 1142  GLUCAP 144* 96 109* 106* 111*   Lipid Profile: No results for input(s): "CHOL", "HDL", "LDLCALC", "TRIG", "CHOLHDL", "LDLDIRECT" in the last 72 hours. Thyroid Function Tests: No results for input(s): "TSH", "T4TOTAL", "FREET4", "T3FREE", "THYROIDAB" in the last 72 hours. Anemia Panel: No results for input(s): "VITAMINB12", "FOLATE", "FERRITIN", "TIBC", "IRON", "RETICCTPCT" in the last 72 hours. Sepsis Labs: No results for input(s): "PROCALCITON", "LATICACIDVEN" in the last 168 hours.  Recent Results (from the past 240 hours)  MRSA Next Gen by PCR, Nasal     Status: None   Collection Time: 08/16/23  5:35 PM   Specimen: Nasal Mucosa; Nasal Swab  Result Value Ref Range Status   MRSA by PCR Next Gen NOT DETECTED NOT DETECTED Final    Comment: (NOTE) The GeneXpert MRSA Assay (FDA approved for NASAL specimens only), is one component of a comprehensive MRSA colonization surveillance program. It is not intended to diagnose MRSA infection nor to guide or monitor treatment for MRSA infections. Test performance is not FDA approved in patients less than 80 years old. Performed at [facility], [address], [place], [place] [address]          Radiology Studies: CT Angio Abd/Pel w/ and/or w/o Result Date: 08/16/2023 CLINICAL DATA:  Lower GI bleeding EXAM: CTA ABDOMEN AND PELVIS WITHOUT AND WITH CONTRAST TECHNIQUE: Multidetector CT imaging of the abdomen and pelvis was performed using the standard protocol during bolus administration of  intravenous contrast. Multiplanar reconstructed images and MIPs were obtained and reviewed to evaluate the vascular anatomy. RADIATION DOSE REDUCTION: This exam was performed according to the departmental dose-optimization program which includes automated exposure control, adjustment of the mA and/or kV according to patient size and/or use of iterative reconstruction technique. CONTRAST:  OMNIPAQUE IOHEXOL 350 MG/ML SOLN COMPARISON:  07/27/23 FINDINGS: VASCULAR Aorta: Atherosclerotic calcifications of the abdominal aorta are noted. No aneurysmal dilatation or dissection is seen. Celiac: Patent without evidence of aneurysm, dissection, vasculitis or significant stenosis. SMA: Patent without evidence of aneurysm, dissection, vasculitis or significant stenosis. Renals: Both renal arteries are patent without evidence of aneurysm, dissection, vasculitis, fibromuscular dysplasia or significant stenosis. IMA: Patent without evidence of aneurysm, dissection, vasculitis or significant stenosis. Inflow: Iliacs are within normal limits Veins: No specific venous abnormality is noted. Review of the MIP images confirms the above findings. NON-VASCULAR Lower chest: Lung bases demonstrate bilateral large pleural effusions left slightly greater than right with  associated basilar atelectasis. Hepatobiliary: No focal liver abnormality is seen. No gallstones, gallbladder wall thickening, or biliary dilatation. Pancreas: Unremarkable. No pancreatic ductal dilatation or surrounding inflammatory changes. Spleen: Normal in size without focal abnormality. Adrenals/Urinary Tract: Adrenal glands are within normal limits. Kidneys demonstrate a normal enhancement pattern bilaterally. Large simple cyst is noted within the left kidney measuring proximally 6.8 cm in greatest dimension. This is stable in appearance from the prior exam. No further follow-up is recommended. No renal calculi or obstructive changes are noted. The bladder is well  distended. Stomach/Bowel: No findings to suggest active GI hemorrhage are identified. The degree of colonic wall thickening has improved in the interval from the prior exam. No obstructive changes are seen. Some retained fecal material and fluid is noted within the right colon. The appendix is within normal limits. Small bowel and stomach are unremarkable. Lymphatic: No lymphadenopathy is seen. Reproductive: Uterus and bilateral adnexa are unremarkable. Other: No abdominal wall hernia or abnormality. No abdominopelvic ascites. Musculoskeletal: Chronic compression deformity at L1 is noted. IMPRESSION: VASCULAR No vascular abnormality is noted. Specifically no findings to suggest acute GI hemorrhage are seen. NON-VASCULAR Previously seen changes of pancolitis have improved significantly. Bilateral pleural effusions and basilar atelectasis. No acute abnormality is identified. Electronically Signed   By: Alcide Clever M.D.   On: 08/16/2023 18:00        Scheduled Meds:  Chlorhexidine Gluconate Cloth  6 each Topical Q0600   insulin aspart  0-9 Units Subcutaneous Q4H   Continuous Infusions:  potassium chloride 10 mEq (08/17/23 1240)   potassium chloride       LOS: 1 day    Time spent: 35 minutes    Zayed Griffie Hoover Brunette, DO [facility]  If 7PM-7AM, please contact night-coverage [email or web address] 08/17/2023, 12:54 PM

## 2023-08-18 DIAGNOSIS — D62 Acute posthemorrhagic anemia: Secondary | ICD-10-CM | POA: Diagnosis not present

## 2023-08-18 LAB — BASIC METABOLIC PANEL
Anion gap: 8 (ref 5–15)
BUN: <5 mg/dL — ABNORMAL LOW (ref 8–23)
CO2: 27 mmol/L (ref 22–32)
Calcium: 8.3 mg/dL — ABNORMAL LOW (ref 8.9–10.3)
Chloride: 103 mmol/L (ref 98–111)
Creatinine, Ser: 0.35 mg/dL — ABNORMAL LOW (ref 0.44–1.00)
GFR, Estimated: >60 mL/min (ref >60)
Glucose, Bld: 110 mg/dL — ABNORMAL HIGH (ref 70–99)
Potassium: 2.8 mmol/L — ABNORMAL LOW (ref 3.5–5.1)
Sodium: 138 mmol/L (ref 135–145)

## 2023-08-18 LAB — GLUCOSE, CAPILLARY
Glucose-Capillary: 102 mg/dL — ABNORMAL HIGH (ref 70–99)
Glucose-Capillary: 110 mg/dL — ABNORMAL HIGH (ref 70–99)
Glucose-Capillary: 137 mg/dL — ABNORMAL HIGH (ref 70–99)
Glucose-Capillary: 131 mg/dL — ABNORMAL HIGH (ref 70–99)
Glucose-Capillary: 151 mg/dL — ABNORMAL HIGH (ref 70–99)

## 2023-08-18 LAB — CBC
HCT: 31.1 % — ABNORMAL LOW (ref 36.0–46.0)
Hemoglobin: 9.6 g/dL — ABNORMAL LOW (ref 12.0–15.0)
MCH: 26.4 pg (ref 26.0–34.0)
MCHC: 30.9 g/dL (ref 30.0–36.0)
MCV: 85.4 fL (ref 80.0–100.0)
Platelets: 145 10*3/uL — ABNORMAL LOW (ref 150–400)
RBC: 3.64 MIL/uL — ABNORMAL LOW (ref 3.87–5.11)
RDW: 16.4 % — ABNORMAL HIGH (ref 11.5–15.5)
WBC: 4.7 10*3/uL (ref 4.0–10.5)
nRBC: 0 % (ref 0.0–0.2)

## 2023-08-18 LAB — MAGNESIUM: Magnesium: 2 mg/dL (ref 1.7–2.4)

## 2023-08-18 MED ORDER — POTASSIUM CHLORIDE CRYS ER 20 MEQ PO TBCR
40.0000 meq | EXTENDED_RELEASE_TABLET | Freq: Once | ORAL | Status: DC
  Filled 2023-08-18: qty 2

## 2023-08-18 MED ORDER — INSULIN ASPART 100 UNIT/ML IJ SOLN
0.0000 [IU] | Freq: Three times a day (TID) | INTRAMUSCULAR | Status: DC
Start: 2023-08-19 — End: 2023-08-19
  Administered 2023-08-19: 1 [IU] via SUBCUTANEOUS

## 2023-08-18 MED ORDER — POTASSIUM CHLORIDE 20 MEQ PO PACK
40.0000 meq | PACK | Freq: Two times a day (BID) | ORAL | Status: AC
  Administered 2023-08-18 (×2): 40 meq via ORAL
  Filled 2023-08-18 (×2): qty 2

## 2023-08-18 MED ORDER — POTASSIUM CHLORIDE 10 MEQ/100ML IV SOLN
10.0000 meq | INTRAVENOUS | Status: AC
  Administered 2023-08-18 (×6): 10 meq via INTRAVENOUS
  Filled 2023-08-18 (×6): qty 100

## 2023-08-18 NOTE — Plan of Care (Signed)

## 2023-08-18 NOTE — Progress Notes (Signed)
PROGRESS NOTE    Tracy Bonilla  ZOX:[id number] DOB: 1950-08-29 DOA: 08/16/2023 PCP: Elfredia Nevins, MD   Brief Narrative:    Tracy Bonilla is a 72 y.o. female with medical history significant for hypertension, type 2 diabetes, dyslipidemia, and recent C. difficile colitis who presented to the ED with loose bloody bowel movement that began yesterday morning.  She states that she has had several bowel movements that were bloody in nature throughout the course of the day.  She was admitted for acute blood loss anemia secondary to GI bleed in the setting of recent C. difficile colitis.  She has undergone flex sigmoidoscopy on 12/27 with findings of hemorrhoids and no further bleeding noted.  She continues to have severe hypokalemia requiring ongoing repletion.  Assessment & Plan:   Principal Problem:   Acute blood loss anemia Active Problems:   Essential hypertension   Type 2 diabetes mellitus (HCC)   Hypokalemia   Lower GI bleed  Assessment and Plan:  Acute blood loss anemia secondary to GI bleed -Hemoglobin noted to be around 12-14, 10 days ago and now is 8.5 -1 unit PRBC transfusion ordered -Hemoglobin levels currently stable -Resume diet per GI -Flex sigmoidoscopy 12/27 with internal and external hemorrhoids noted with no further bleeding   Recent C. difficile colitis -Recent discharge 12/16 - maintain on enteric precautions   Severe hypokalemia -Replete IV and p.o. and continue to monitor   Hypertension -Resume home blood pressure agents   Diabetes mellitus with mild hyperglycemia -SSI every 4 hours with close monitoring   Obesity class I -BMI 31.18    DVT prophylaxis:SCDs Code Status: Full Family Communication: None at bedside Disposition Plan:  Status is: Inpatient Remains inpatient appropriate because: Need for IV medications and close monitoring  Consultants:  GI  Procedures:  Flex sig 12/27  Antimicrobials:  None   Subjective: Patient seen and  evaluated today with no new acute complaints or concerns. No acute concerns or events noted overnight.  Hemoglobin levels have remained stable, however she has severe hypokalemia requiring IV and oral repletion this morning.  Objective: Vitals:   08/17/23 1500 08/17/23 1611 08/17/23 1958 08/18/23 0344  BP: (!) 142/54 (!) 156/66 (!) 146/74 135/64  Pulse: 93 79 99 75  Resp: 14 20  20   Temp:  97.9 F (36.6 C) 97.6 F (36.4 C) 98 F (36.7 C)  TempSrc:  Oral Oral Oral  SpO2: 90% 100% 98% 96%  Weight:      Height:        Intake/Output Summary (Last 24 hours) at 08/18/2023 1020 Last data filed at 08/18/2023 0830 Gross per 24 hour  Intake 1141.96 ml  Output 0 ml  Net 1141.96 ml   Filed Weights   08/16/23 1120 08/16/23 1800 08/17/23 0600  Weight: 79.8 kg 83.1 kg 83.2 kg    Examination:  General exam: Appears calm and comfortable  Respiratory system: Clear to auscultation. Respiratory effort normal. Cardiovascular system: S1 & S2 heard, RRR.  Gastrointestinal system: Abdomen is soft Central nervous system: Alert and awake Extremities: No edema Skin: No significant lesions noted Psychiatry: Flat affect.    Data Reviewed: I have personally reviewed following labs and imaging studies  CBC: Recent Labs  Lab 08/16/23 1208 08/16/23 1847 08/17/23 0038 08/17/23 0645 08/18/23 0503  WBC 7.1  --  4.8  --  4.7  HGB 8.5* 9.7* 8.4* 9.1* 9.6*  HCT 27.4* 31.7* 25.9* 28.0* 31.1*  MCV 83.8  --  83.3  --  85.4  PLT 167  --  136*  --  145*   Basic Metabolic Panel: Recent Labs  Lab 08/16/23 1208 08/17/23 0038 08/18/23 0503  NA 136 140 138  K 3.4* 2.9* 2.8*  CL 100 105 103  CO2 27 28 27   GLUCOSE 174* 102* 110*  BUN 5* <5* <5*  CREATININE 0.47 0.31* 0.35*  CALCIUM 9.6 8.5* 8.3*  MG  --  1.6* 2.0   GFR: Estimated Creatinine Clearance: 68.7 mL/min (A) (by C-G formula based on SCr of 0.35 mg/dL (L)). Liver Function Tests: Recent Labs  Lab 08/16/23 1208 08/17/23 0038  AST  19 15  ALT 16 14  ALKPHOS 37* 31*  BILITOT 0.5 0.6  PROT 5.6* 4.5*  ALBUMIN 2.9* 2.4*   No results for input(s): "LIPASE", "AMYLASE" in the last 168 hours. No results for input(s): "AMMONIA" in the last 168 hours. Coagulation Profile: Recent Labs  Lab 08/16/23 1208 08/16/23 1419  INR >10.0* 1.0   Cardiac Enzymes: No results for input(s): "CKTOTAL", "CKMB", "CKMBINDEX", "TROPONINI" in the last 168 hours. BNP (last 3 results) No results for input(s): "PROBNP" in the last 8760 hours. HbA1C: No results for input(s): "HGBA1C" in the last 72 hours. CBG: Recent Labs  Lab 08/17/23 1600 08/17/23 2026 08/17/23 2351 08/18/23 0352 08/18/23 0740  GLUCAP 138* 144* 109* 102* 110*   Lipid Profile: No results for input(s): "CHOL", "HDL", "LDLCALC", "TRIG", "CHOLHDL", "LDLDIRECT" in the last 72 hours. Thyroid Function Tests: No results for input(s): "TSH", "T4TOTAL", "FREET4", "T3FREE", "THYROIDAB" in the last 72 hours. Anemia Panel: No results for input(s): "VITAMINB12", "FOLATE", "FERRITIN", "TIBC", "IRON", "RETICCTPCT" in the last 72 hours. Sepsis Labs: No results for input(s): "PROCALCITON", "LATICACIDVEN" in the last 168 hours.  Recent Results (from the past 240 hours)  MRSA Next Gen by PCR, Nasal     Status: None   Collection Time: 08/16/23  5:35 PM   Specimen: Nasal Mucosa; Nasal Swab  Result Value Ref Range Status   MRSA by PCR Next Gen NOT DETECTED NOT DETECTED Final    Comment: (NOTE) The GeneXpert MRSA Assay (FDA approved for NASAL specimens only), is one component of a comprehensive MRSA colonization surveillance program. It is not intended to diagnose MRSA infection nor to guide or monitor treatment for MRSA infections. Test performance is not FDA approved in patients less than 2 years old. Performed at [facility], [address], [place], [place] [address]          Radiology Studies: CT Angio Abd/Pel w/ and/or w/o Result Date: 08/16/2023 CLINICAL DATA:   Lower GI bleeding EXAM: CTA ABDOMEN AND PELVIS WITHOUT AND WITH CONTRAST TECHNIQUE: Multidetector CT imaging of the abdomen and pelvis was performed using the standard protocol during bolus administration of intravenous contrast. Multiplanar reconstructed images and MIPs were obtained and reviewed to evaluate the vascular anatomy. RADIATION DOSE REDUCTION: This exam was performed according to the departmental dose-optimization program which includes automated exposure control, adjustment of the mA and/or kV according to patient size and/or use of iterative reconstruction technique. CONTRAST:  OMNIPAQUE IOHEXOL 350 MG/ML SOLN COMPARISON:  07/27/23 FINDINGS: VASCULAR Aorta: Atherosclerotic calcifications of the abdominal aorta are noted. No aneurysmal dilatation or dissection is seen. Celiac: Patent without evidence of aneurysm, dissection, vasculitis or significant stenosis. SMA: Patent without evidence of aneurysm, dissection, vasculitis or significant stenosis. Renals: Both renal arteries are patent without evidence of aneurysm, dissection, vasculitis, fibromuscular dysplasia or significant stenosis. IMA: Patent without evidence of aneurysm, dissection, vasculitis or significant stenosis. Inflow: Iliacs  are within normal limits Veins: No specific venous abnormality is noted. Review of the MIP images confirms the above findings. NON-VASCULAR Lower chest: Lung bases demonstrate bilateral large pleural effusions left slightly greater than right with associated basilar atelectasis. Hepatobiliary: No focal liver abnormality is seen. No gallstones, gallbladder wall thickening, or biliary dilatation. Pancreas: Unremarkable. No pancreatic ductal dilatation or surrounding inflammatory changes. Spleen: Normal in size without focal abnormality. Adrenals/Urinary Tract: Adrenal glands are within normal limits. Kidneys demonstrate a normal enhancement pattern bilaterally. Large simple cyst is noted within the left kidney  measuring proximally 6.8 cm in greatest dimension. This is stable in appearance from the prior exam. No further follow-up is recommended. No renal calculi or obstructive changes are noted. The bladder is well distended. Stomach/Bowel: No findings to suggest active GI hemorrhage are identified. The degree of colonic wall thickening has improved in the interval from the prior exam. No obstructive changes are seen. Some retained fecal material and fluid is noted within the right colon. The appendix is within normal limits. Small bowel and stomach are unremarkable. Lymphatic: No lymphadenopathy is seen. Reproductive: Uterus and bilateral adnexa are unremarkable. Other: No abdominal wall hernia or abnormality. No abdominopelvic ascites. Musculoskeletal: Chronic compression deformity at L1 is noted. IMPRESSION: VASCULAR No vascular abnormality is noted. Specifically no findings to suggest acute GI hemorrhage are seen. NON-VASCULAR Previously seen changes of pancolitis have improved significantly. Bilateral pleural effusions and basilar atelectasis. No acute abnormality is identified. Electronically Signed   By: Alcide Clever M.D.   On: 08/16/2023 18:00        Scheduled Meds:  atorvastatin  20 mg Oral QHS   Chlorhexidine Gluconate Cloth  6 each Topical Q0600   gabapentin  100 mg Oral BID WC   gabapentin  300 mg Oral QHS   insulin aspart  0-9 Units Subcutaneous Q4H   losartan  50 mg Oral QHS   metoprolol tartrate  12.5 mg Oral BID   potassium chloride  40 mEq Oral BID   Continuous Infusions:  potassium chloride       LOS: 2 days    Time spent: 35 minutes    Gayland Nicol Hoover Brunette, DO [facility]  If 7PM-7AM, please contact night-coverage [email or web address] 08/18/2023, 10:20 AM

## 2023-08-19 DIAGNOSIS — D62 Acute posthemorrhagic anemia: Secondary | ICD-10-CM | POA: Diagnosis not present

## 2023-08-19 LAB — BASIC METABOLIC PANEL
Anion gap: 8 (ref 5–15)
BUN: <5 mg/dL — ABNORMAL LOW (ref 8–23)
CO2: 26 mmol/L (ref 22–32)
Calcium: 8.4 mg/dL — ABNORMAL LOW (ref 8.9–10.3)
Chloride: 107 mmol/L (ref 98–111)
Creatinine, Ser: 0.53 mg/dL (ref 0.44–1.00)
GFR, Estimated: >60 mL/min (ref >60)
Glucose, Bld: 150 mg/dL — ABNORMAL HIGH (ref 70–99)
Potassium: 3.7 mmol/L (ref 3.5–5.1)
Sodium: 141 mmol/L (ref 135–145)

## 2023-08-19 LAB — CBC
HCT: 30.3 % — ABNORMAL LOW (ref 36.0–46.0)
Hemoglobin: 9.6 g/dL — ABNORMAL LOW (ref 12.0–15.0)
MCH: 26.9 pg (ref 26.0–34.0)
MCHC: 31.7 g/dL (ref 30.0–36.0)
MCV: 84.9 fL (ref 80.0–100.0)
Platelets: 162 10*3/uL (ref 150–400)
RBC: 3.57 MIL/uL — ABNORMAL LOW (ref 3.87–5.11)
RDW: 16.9 % — ABNORMAL HIGH (ref 11.5–15.5)
WBC: 4.4 10*3/uL (ref 4.0–10.5)
nRBC: 0 % (ref 0.0–0.2)

## 2023-08-19 LAB — MAGNESIUM: Magnesium: 1.9 mg/dL (ref 1.7–2.4)

## 2023-08-19 LAB — GLUCOSE, CAPILLARY: Glucose-Capillary: 128 mg/dL — ABNORMAL HIGH (ref 70–99)

## 2023-08-19 MED ORDER — POTASSIUM CHLORIDE 20 MEQ PO PACK: 20.0000 meq | PACK | Freq: Every day | ORAL | 0 refills | Status: AC

## 2023-08-19 NOTE — Discharge Summary (Signed)
Physician Discharge Summary  Tracy Bonilla ZOX:[id number] DOB: January 28, 1951 DOA: 08/16/2023  PCP: Tracy Nevins, MD  Admit date: 08/16/2023  Discharge date: 08/19/2023  Admitted From:Home  Disposition:  Home  Recommendations for Outpatient Follow-up:  Follow up with PCP in 1-2 weeks Follow-up with GI as will be scheduled in 2-3 weeks and repeat CBC in 1 week Continue on potassium supplementation as prescribed and follow-up BMP in 1 week Continue other home medications as prior  Home Health: None  Equipment/Devices: None  Discharge Condition:Stable  CODE STATUS: Full  Diet recommendation: Heart Healthy/carb modified  Brief/Interim Summary: Tracy Bonilla is a 72 y.o. female with medical history significant for hypertension, type 2 diabetes, dyslipidemia, and recent C. difficile colitis who presented to the ED with loose bloody bowel movement that began yesterday morning.  She states that she has had several bowel movements that were bloody in nature throughout the course of the day.  She was admitted for acute blood loss anemia secondary to GI bleed in the setting of recent C. difficile colitis.  She did receive 1 unit PRBC transfusion.  She has undergone flex sigmoidoscopy on 12/27 with findings of hemorrhoids and no further bleeding noted.  She was noted to have stable hemoglobin levels and no further bleeding noted and is stable for discharge from a GI standpoint.  She was noted to have some ongoing severe hypokalemia that required IV and oral potassium supplementation, but this has now normalized.  No other acute events or concerns noted.  Discharge Diagnoses:  Principal Problem:   Acute blood loss anemia Active Problems:   Essential hypertension   Type 2 diabetes mellitus (HCC)   Hypokalemia   Lower GI bleed  Principal discharge diagnosis: Acute blood loss anemia secondary to GI bleed likely related to hemorrhoidal bleed status post 1 unit PRBC transfusion.  Discharge  Instructions  Discharge Instructions     Diet - low sodium heart healthy   Complete by: As directed    Increase activity slowly   Complete by: As directed       Allergies as of 08/19/2023   No Known Allergies      Medication List     STOP taking these medications    vancomycin 125 MG capsule Commonly known as: VANCOCIN       TAKE these medications    acidophilus Caps capsule Take 2 capsules by mouth 3 (three) times daily.   atorvastatin 20 MG tablet Commonly known as: LIPITOR Take 20 mg by mouth at bedtime.   gabapentin 100 MG capsule Commonly known as: NEURONTIN Take 100-300 mg by mouth See admin instructions. Take 100 mg (1 capsule) at breakfast, 100 mg at noon, 300 mg (3 capsules) at bedtime.   glimepiride 4 MG tablet Commonly known as: AMARYL Take 4 mg by mouth 2 (two) times daily.   hydrochlorothiazide 25 MG tablet Commonly known as: HYDRODIURIL Take 25 mg by mouth daily with lunch.   loratadine 10 MG tablet Commonly known as: CLARITIN Take 10 mg by mouth daily.   losartan 50 MG tablet Commonly known as: COZAAR Take 50 mg by mouth at bedtime.   metFORMIN 500 MG 24 hr tablet Commonly known as: GLUCOPHAGE-XR Take 1 tablet (500 mg total) by mouth daily with breakfast.   metoprolol tartrate 25 MG tablet Commonly known as: LOPRESSOR Take 0.5 tablets (12.5 mg total) by mouth 2 (two) times daily.   ondansetron 4 MG disintegrating tablet Commonly known as: ZOFRAN-ODT Take 4-8 mg by mouth every  6 (six) hours as needed for nausea or vomiting.   potassium chloride 20 MEQ packet Commonly known as: KLOR-CON Take 20 mEq by mouth daily for 10 days.   VITAMIN D-3 PO Take 1 tablet by mouth at bedtime.        Follow-up Information     Tracy Nevins, MD. Schedule an appointment as soon as possible for a visit in 1 week(s).   Specialty: Internal Medicine Contact information: [address] [place] [address] [phone]          [facility]. Go to.   Contact information: [address] [place] [address] [phone]               No Known Allergies  Consultations: GI   Procedures/Studies: CT Angio Abd/Pel w/ and/or w/o Result Date: 08/16/2023 CLINICAL DATA:  Lower GI bleeding EXAM: CTA ABDOMEN AND PELVIS WITHOUT AND WITH CONTRAST TECHNIQUE: Multidetector CT imaging of the abdomen and pelvis was performed using the standard protocol during bolus administration of intravenous contrast. Multiplanar reconstructed images and MIPs were obtained and reviewed to evaluate the vascular anatomy. RADIATION DOSE REDUCTION: This exam was performed according to the departmental dose-optimization program which includes automated exposure control, adjustment of the mA and/or kV according to patient size and/or use of iterative reconstruction technique. CONTRAST:  OMNIPAQUE IOHEXOL 350 MG/ML SOLN COMPARISON:  07/27/23 FINDINGS: VASCULAR Aorta: Atherosclerotic calcifications of the abdominal aorta are noted. No aneurysmal dilatation or dissection is seen. Celiac: Patent without evidence of aneurysm, dissection, vasculitis or significant stenosis. SMA: Patent without evidence of aneurysm, dissection, vasculitis or significant stenosis. Renals: Both renal arteries are patent without evidence of aneurysm, dissection, vasculitis, fibromuscular dysplasia or significant stenosis. IMA: Patent without evidence of aneurysm, dissection, vasculitis or significant stenosis. Inflow: Iliacs are within normal limits Veins: No specific venous abnormality is noted. Review of the MIP images confirms the above findings. NON-VASCULAR Lower chest: Lung bases demonstrate bilateral large pleural effusions left slightly greater than right with associated basilar atelectasis. Hepatobiliary: No focal liver abnormality is seen. No gallstones, gallbladder wall thickening, or biliary dilatation. Pancreas:  Unremarkable. No pancreatic ductal dilatation or surrounding inflammatory changes. Spleen: Normal in size without focal abnormality. Adrenals/Urinary Tract: Adrenal glands are within normal limits. Kidneys demonstrate a normal enhancement pattern bilaterally. Large simple cyst is noted within the left kidney measuring proximally 6.8 cm in greatest dimension. This is stable in appearance from the prior exam. No further follow-up is recommended. No renal calculi or obstructive changes are noted. The bladder is well distended. Stomach/Bowel: No findings to suggest active GI hemorrhage are identified. The degree of colonic wall thickening has improved in the interval from the prior exam. No obstructive changes are seen. Some retained fecal material and fluid is noted within the right colon. The appendix is within normal limits. Small bowel and stomach are unremarkable. Lymphatic: No lymphadenopathy is seen. Reproductive: Uterus and bilateral adnexa are unremarkable. Other: No abdominal wall hernia or abnormality. No abdominopelvic ascites. Musculoskeletal: Chronic compression deformity at L1 is noted. IMPRESSION: VASCULAR No vascular abnormality is noted. Specifically no findings to suggest acute GI hemorrhage are seen. NON-VASCULAR Previously seen changes of pancolitis have improved significantly. Bilateral pleural effusions and basilar atelectasis. No acute abnormality is identified. Electronically Signed   By: Alcide Clever M.D.   On: 08/16/2023 18:00   ECHOCARDIOGRAM COMPLETE Result Date: 07/29/2023    ECHOCARDIOGRAM REPORT   Patient Name:   KENNA TASSINARI Date of Exam: 07/28/2023 Medical Rec #:  [id number]    Height:       63.0 in Accession #:    [id number]   Weight:       176.0 lb Date of Birth:  1951/03/21     BSA:          1.831 m Patient Age:    72 years     BP:           116/60 mmHg Patient Gender: F            HR:           84 bpm. [facility] Procedure: 2D Echo, Cardiac Doppler and Color Doppler  Indications:    LBBB  History:        Patient has no prior history of Echocardiogram examinations.                 Risk Factors:Hypertension, Dyslipidemia and Diabetes.  Sonographer:    Milda Smart Referring Phys: [id number] DAWO[address] S ELGERGAWY IMPRESSIONS  1. Abnormal septal motion ? from LBBB. Left ventricular ejection fraction, by estimation, is 40 to 45%. The left ventricle has mildly decreased function. The left ventricle demonstrates global hypokinesis. The left ventricular internal cavity size was mildly dilated. There is mild left ventricular hypertrophy. Left ventricular diastolic parameters are consistent with Grade I diastolic dysfunction (impaired relaxation).  2. Right ventricular systolic function is normal. The right ventricular size is normal.  3. Left atrial size was mildly dilated.  4. The mitral valve is normal in structure. No evidence of mitral valve regurgitation. No evidence of mitral stenosis.  5. The aortic valve is tricuspid. Aortic valve regurgitation is not visualized. No aortic stenosis is present.  6. The inferior vena cava is normal in size with greater than 50% respiratory variability, suggesting right atrial pressure of 3 mmHg. FINDINGS  Left Ventricle: Abnormal septal motion ? from LBBB. Left ventricular ejection fraction, by estimation, is 40 to 45%. The left ventricle has mildly decreased function. The left ventricle demonstrates global hypokinesis. The left ventricular internal cavity size was mildly dilated. There is mild left ventricular hypertrophy. Left ventricular diastolic parameters are consistent with Grade I diastolic dysfunction (impaired relaxation). Right Ventricle: The right ventricular size is normal. No increase in right ventricular wall thickness. Right ventricular systolic function is normal. Left Atrium: Left atrial size was mildly dilated. Right Atrium: Right atrial size was normal in size. Pericardium: There is no evidence of pericardial effusion. Mitral Valve:  The mitral valve is normal in structure. No evidence of mitral valve regurgitation. No evidence of mitral valve stenosis. Tricuspid Valve: The tricuspid valve is normal in structure. Tricuspid valve regurgitation is not demonstrated. No evidence of tricuspid stenosis. Aortic Valve: The aortic valve is tricuspid. Aortic valve regurgitation is not visualized. No aortic stenosis is present. Pulmonic Valve: The pulmonic valve was normal in structure. Pulmonic valve regurgitation is not visualized. No evidence of pulmonic stenosis. Aorta: The aortic root is normal in size and structure. Venous: The inferior vena cava is normal in size with greater than 50% respiratory variability, suggesting right atrial pressure of 3 mmHg. IAS/Shunts: No atrial level shunt detected by color flow Doppler.  LEFT VENTRICLE PLAX 2D LVIDd:         4.00 cm   Diastology LVIDs:         3.10 cm   LV e' medial:  6.64 cm/s LV PW:         1.20 cm   LV e' lateral: 4.35  cm/s LV IVS:        1.30 cm LVOT diam:     2.20 cm LV SV:         69 LV SV Index:   38 LVOT Area:     3.80 cm  RIGHT VENTRICLE             IVC RV S prime:     10.20 cm/s  IVC diam: 1.20 cm TAPSE (M-mode): 1.3 cm LEFT ATRIUM             Index        RIGHT ATRIUM           Index LA diam:        3.90 cm 2.13 cm/m   RA Area:     11.30 cm LA Vol (A2C):   27.8 ml 15.18 ml/m  RA Volume:   21.80 ml  11.90 ml/m LA Vol (A4C):   31.7 ml 17.31 ml/m LA Biplane Vol: 29.7 ml 16.22 ml/m  AORTIC VALVE LVOT Vmax:   118.00 cm/s LVOT Vmean:  77.300 cm/s LVOT VTI:    0.182 m  AORTA Ao Root diam: 3.20 cm Ao Asc diam:  3.60 cm  SHUNTS Systemic VTI:  0.18 m Systemic Diam: 2.20 cm Charlton Haws MD Electronically signed by Charlton Haws MD Signature Date/Time: 07/29/2023/7:57:28 AM    Final    CT ABDOMEN PELVIS WO CONTRAST Result Date: 07/27/2023 CLINICAL DATA:  One-week history of diarrhea and dry heaving with decreased appetite EXAM: CT ABDOMEN AND PELVIS WITHOUT CONTRAST TECHNIQUE: Multidetector CT  imaging of the abdomen and pelvis was performed following the standard protocol without IV contrast. RADIATION DOSE REDUCTION: This exam was performed according to the departmental dose-optimization program which includes automated exposure control, adjustment of the mA and/or kV according to patient size and/or use of iterative reconstruction technique. COMPARISON:  None Available. FINDINGS: Decreased sensitivity and specificity for detailed findings due to motion artifact. Lower chest: No focal consolidation or pulmonary nodule in the lung bases. No pleural effusion or pneumothorax demonstrated. Partially imaged heart size is normal. Hepatobiliary: No focal hepatic lesions. No intra or extrahepatic biliary ductal dilation. Mildly distended gallbladder with layering hyperattenuation, which may represent stones/sludge. Pancreas: No focal lesions or main ductal dilation. Spleen: Normal in size without focal abnormality. Adrenals/Urinary Tract: No adrenal nodules. No suspicious renal mass on this noncontrast enhanced examination, calculi or hydronephrosis. No focal bladder wall thickening. Stomach/Bowel: Normal appearance of the stomach. Diffusely patulous, featureless appearance of the colon with marked circumferential mural thickening. Normal appearance of the terminal ileum. Mild mural thickening of the appendiceal base. Vascular/Lymphatic: Aortic atherosclerosis. No enlarged abdominal or pelvic lymph nodes. Reproductive: No adnexal masses. Other: Small volume ascites.  No free air or fluid collection. Musculoskeletal: No acute or abnormal lytic or blastic osseous lesions. Multilevel degenerative changes of the partially imaged thoracic and lumbar spine. Anterior wedging of L1. IMPRESSION: 1. Findings of pancolitis, which may be infectious or inflammatory in the acute setting. Given appearance of marked mural thickening and ascites, C diff colitis is within the differential. 2. Mild mural thickening of the  appendiceal base, likely reactive. 3. Small volume ascites. 4. Mildly distended gallbladder with layering hyperattenuation, which may represent stones/sludge. 5.  Aortic Atherosclerosis (ICD10-I70.0). Electronically Signed   By: Agustin Cree M.D.   On: 07/27/2023 16:31     Discharge Exam: Vitals:   08/18/23 1942 08/19/23 0649  BP: (!) 129/58 (!) 151/69  Pulse: 99 80  Resp: 20 18  Temp: 98 F (36.7 C) 97.9 F (36.6 C)  SpO2: 98% 95%   Vitals:   08/18/23 0344 08/18/23 1425 08/18/23 1942 08/19/23 0649  BP: 135/64 136/70 (!) 129/58 (!) 151/69  Pulse: 75 (!) 102 99 80  Resp: 20 19 20 18   Temp: 98 F (36.7 C) 98.2 F (36.8 C) 98 F (36.7 C) 97.9 F (36.6 C)  TempSrc: Oral  Oral Oral  SpO2: 96% 99% 98% 95%  Weight:      Height:        General: Pt is alert, awake, not in acute distress Cardiovascular: RRR, S1/S2 +, no rubs, no gallops Respiratory: CTA bilaterally, no wheezing, no rhonchi Abdominal: Soft, NT, ND, bowel sounds + Extremities: no edema, no cyanosis    The results of significant diagnostics from this hospitalization (including imaging, microbiology, ancillary and laboratory) are listed below for reference.     Microbiology: Recent Results (from the past 240 hours)  MRSA Next Gen by PCR, Nasal     Status: None   Collection Time: 08/16/23  5:35 PM   Specimen: Nasal Mucosa; Nasal Swab  Result Value Ref Range Status   MRSA by PCR Next Gen NOT DETECTED NOT DETECTED Final    Comment: (NOTE) The GeneXpert MRSA Assay (FDA approved for NASAL specimens only), is one component of a comprehensive MRSA colonization surveillance program. It is not intended to diagnose MRSA infection nor to guide or monitor treatment for MRSA infections. Test performance is not FDA approved in patients less than 54 years old. Performed at [facility], [address], [place], [place] [address]      Labs: BNP (last 3 results) No results for input(s): "BNP" in the last 8760 hours. Basic  Metabolic Panel: Recent Labs  Lab 08/16/23 1208 08/17/23 0038 08/18/23 0503 08/19/23 0431  NA 136 140 138 141  K 3.4* 2.9* 2.8* 3.7  CL 100 105 103 107  CO2 27 28 27 26   GLUCOSE 174* 102* 110* 150*  BUN 5* <5* <5* <5*  CREATININE 0.47 0.31* 0.35* 0.53  CALCIUM 9.6 8.5* 8.3* 8.4*  MG  --  1.6* 2.0 1.9   Liver Function Tests: Recent Labs  Lab 08/16/23 1208 08/17/23 0038  AST 19 15  ALT 16 14  ALKPHOS 37* 31*  BILITOT 0.5 0.6  PROT 5.6* 4.5*  ALBUMIN 2.9* 2.4*   No results for input(s): "LIPASE", "AMYLASE" in the last 168 hours. No results for input(s): "AMMONIA" in the last 168 hours. CBC: Recent Labs  Lab 08/16/23 1208 08/16/23 1847 08/17/23 0038 08/17/23 0645 08/18/23 0503 08/19/23 0431  WBC 7.1  --  4.8  --  4.7 4.4  HGB 8.5* 9.7* 8.4* 9.1* 9.6* 9.6*  HCT 27.4* 31.7* 25.9* 28.0* 31.1* 30.3*  MCV 83.8  --  83.3  --  85.4 84.9  PLT 167  --  136*  --  145* 162   Cardiac Enzymes: No results for input(s): "CKTOTAL", "CKMB", "CKMBINDEX", "TROPONINI" in the last 168 hours. BNP: Invalid input(s): "POCBNP" CBG: Recent Labs  Lab 08/18/23 0740 08/18/23 1156 08/18/23 1627 08/18/23 2019 08/19/23 0750  GLUCAP 110* 137* 131* 151* 128*   D-Dimer No results for input(s): "DDIMER" in the last 72 hours. Hgb A1c No results for input(s): "HGBA1C" in the last 72 hours. Lipid Profile No results for input(s): "CHOL", "HDL", "LDLCALC", "TRIG", "CHOLHDL", "LDLDIRECT" in the last 72 hours. Thyroid function studies No results for input(s): "TSH", "T4TOTAL", "T3FREE", "THYROIDAB" in the last 72 hours.  Invalid input(s): "FREET3"  Anemia work up No results for input(s): "VITAMINB12", "FOLATE", "FERRITIN", "TIBC", "IRON", "RETICCTPCT" in the last 72 hours. Urinalysis    Component Value Date/Time   COLORURINE YELLOW 07/27/2023 1500   APPEARANCEUR HAZY (A) 07/27/2023 1500   LABSPEC 1.017 07/27/2023 1500   PHURINE 5.0 07/27/2023 1500   GLUCOSEU NEGATIVE 07/27/2023 1500    HGBUR NEGATIVE 07/27/2023 1500   BILIRUBINUR NEGATIVE 07/27/2023 1500   KETONESUR 5 (A) 07/27/2023 1500   PROTEINUR 30 (A) 07/27/2023 1500   UROBILINOGEN 0.2 05/06/2015 1349   NITRITE NEGATIVE 07/27/2023 1500   LEUKOCYTESUR NEGATIVE 07/27/2023 1500   Sepsis Labs Recent Labs  Lab 08/16/23 1208 08/17/23 0038 08/18/23 0503 08/19/23 0431  WBC 7.1 4.8 4.7 4.4   Microbiology Recent Results (from the past 240 hours)  MRSA Next Gen by PCR, Nasal     Status: None   Collection Time: 08/16/23  5:35 PM   Specimen: Nasal Mucosa; Nasal Swab  Result Value Ref Range Status   MRSA by PCR Next Gen NOT DETECTED NOT DETECTED Final    Comment: (NOTE) The GeneXpert MRSA Assay (FDA approved for NASAL specimens only), is one component of a comprehensive MRSA colonization surveillance program. It is not intended to diagnose MRSA infection nor to guide or monitor treatment for MRSA infections. Test performance is not FDA approved in patients less than 29 years old. Performed at [facility], [address], [place], [place] [address]      Time coordinating discharge: 35 minutes  SIGNED:   Erick Blinks, DO [facility] 08/19/2023, 9:46 AM  If 7PM-7AM, please contact night-coverage [email or web address]

## 2023-08-19 NOTE — Plan of Care (Signed)

## 2023-08-20 LAB — SURGICAL PATHOLOGY

## 2023-08-23 ENCOUNTER — Encounter (INDEPENDENT_AMBULATORY_CARE_PROVIDER_SITE_OTHER): Payer: Self-pay | Admitting: *Deleted

## 2023-08-30 DIAGNOSIS — I10 Essential (primary) hypertension: Secondary | ICD-10-CM | POA: Diagnosis not present

## 2023-08-30 DIAGNOSIS — I700 Atherosclerosis of aorta: Secondary | ICD-10-CM | POA: Diagnosis not present

## 2023-08-30 DIAGNOSIS — E1142 Type 2 diabetes mellitus with diabetic polyneuropathy: Secondary | ICD-10-CM | POA: Diagnosis not present

## 2023-08-30 DIAGNOSIS — E1159 Type 2 diabetes mellitus with other circulatory complications: Secondary | ICD-10-CM | POA: Diagnosis not present

## 2023-08-30 DIAGNOSIS — K922 Gastrointestinal hemorrhage, unspecified: Secondary | ICD-10-CM | POA: Diagnosis not present

## 2023-08-31 ENCOUNTER — Encounter (HOSPITAL_COMMUNITY): Payer: Self-pay | Admitting: Gastroenterology

## 2023-09-10 ENCOUNTER — Encounter: Payer: Self-pay | Admitting: Gastroenterology

## 2023-09-10 ENCOUNTER — Ambulatory Visit: Payer: Medicare Other | Admitting: Gastroenterology

## 2023-09-13 DIAGNOSIS — D649 Anemia, unspecified: Secondary | ICD-10-CM | POA: Diagnosis not present

## 2023-09-21 DIAGNOSIS — D649 Anemia, unspecified: Secondary | ICD-10-CM | POA: Diagnosis not present

## 2023-09-21 DIAGNOSIS — K922 Gastrointestinal hemorrhage, unspecified: Secondary | ICD-10-CM | POA: Diagnosis not present

## 2023-09-21 DIAGNOSIS — E1142 Type 2 diabetes mellitus with diabetic polyneuropathy: Secondary | ICD-10-CM | POA: Diagnosis not present

## 2023-10-17 DIAGNOSIS — D649 Anemia, unspecified: Secondary | ICD-10-CM | POA: Diagnosis not present

## 2023-10-17 DIAGNOSIS — E1142 Type 2 diabetes mellitus with diabetic polyneuropathy: Secondary | ICD-10-CM | POA: Diagnosis not present

## 2023-10-19 DIAGNOSIS — D649 Anemia, unspecified: Secondary | ICD-10-CM | POA: Diagnosis not present

## 2023-10-19 DIAGNOSIS — I700 Atherosclerosis of aorta: Secondary | ICD-10-CM | POA: Diagnosis not present

## 2023-10-19 DIAGNOSIS — E1142 Type 2 diabetes mellitus with diabetic polyneuropathy: Secondary | ICD-10-CM | POA: Diagnosis not present

## 2023-11-19 DIAGNOSIS — E1142 Type 2 diabetes mellitus with diabetic polyneuropathy: Secondary | ICD-10-CM | POA: Diagnosis not present

## 2023-11-19 DIAGNOSIS — D649 Anemia, unspecified: Secondary | ICD-10-CM | POA: Diagnosis not present

## 2023-12-19 DIAGNOSIS — I700 Atherosclerosis of aorta: Secondary | ICD-10-CM | POA: Diagnosis not present

## 2023-12-19 DIAGNOSIS — E1142 Type 2 diabetes mellitus with diabetic polyneuropathy: Secondary | ICD-10-CM | POA: Diagnosis not present

## 2023-12-19 DIAGNOSIS — D649 Anemia, unspecified: Secondary | ICD-10-CM | POA: Diagnosis not present

## 2024-01-19 DIAGNOSIS — E782 Mixed hyperlipidemia: Secondary | ICD-10-CM | POA: Diagnosis not present

## 2024-01-19 DIAGNOSIS — I700 Atherosclerosis of aorta: Secondary | ICD-10-CM | POA: Diagnosis not present

## 2024-01-19 DIAGNOSIS — E1142 Type 2 diabetes mellitus with diabetic polyneuropathy: Secondary | ICD-10-CM | POA: Diagnosis not present

## 2024-01-19 DIAGNOSIS — I10 Essential (primary) hypertension: Secondary | ICD-10-CM | POA: Diagnosis not present

## 2024-02-01 DIAGNOSIS — E559 Vitamin D deficiency, unspecified: Secondary | ICD-10-CM | POA: Diagnosis not present

## 2024-02-01 DIAGNOSIS — I10 Essential (primary) hypertension: Secondary | ICD-10-CM | POA: Diagnosis not present

## 2024-02-01 DIAGNOSIS — D649 Anemia, unspecified: Secondary | ICD-10-CM | POA: Diagnosis not present

## 2024-02-01 DIAGNOSIS — E039 Hypothyroidism, unspecified: Secondary | ICD-10-CM | POA: Diagnosis not present

## 2024-02-01 DIAGNOSIS — E1159 Type 2 diabetes mellitus with other circulatory complications: Secondary | ICD-10-CM | POA: Diagnosis not present

## 2024-02-01 DIAGNOSIS — E7849 Other hyperlipidemia: Secondary | ICD-10-CM | POA: Diagnosis not present

## 2024-02-01 DIAGNOSIS — Z0001 Encounter for general adult medical examination with abnormal findings: Secondary | ICD-10-CM | POA: Diagnosis not present

## 2024-02-01 DIAGNOSIS — E782 Mixed hyperlipidemia: Secondary | ICD-10-CM | POA: Diagnosis not present

## 2024-02-01 DIAGNOSIS — E1142 Type 2 diabetes mellitus with diabetic polyneuropathy: Secondary | ICD-10-CM | POA: Diagnosis not present

## 2024-02-01 DIAGNOSIS — I700 Atherosclerosis of aorta: Secondary | ICD-10-CM | POA: Diagnosis not present

## 2024-02-18 DIAGNOSIS — I700 Atherosclerosis of aorta: Secondary | ICD-10-CM | POA: Diagnosis not present

## 2024-02-18 DIAGNOSIS — D649 Anemia, unspecified: Secondary | ICD-10-CM | POA: Diagnosis not present

## 2024-02-18 DIAGNOSIS — I10 Essential (primary) hypertension: Secondary | ICD-10-CM | POA: Diagnosis not present

## 2024-02-18 DIAGNOSIS — E1142 Type 2 diabetes mellitus with diabetic polyneuropathy: Secondary | ICD-10-CM | POA: Diagnosis not present

## 2024-03-20 DIAGNOSIS — E1142 Type 2 diabetes mellitus with diabetic polyneuropathy: Secondary | ICD-10-CM | POA: Diagnosis not present

## 2024-03-20 DIAGNOSIS — I10 Essential (primary) hypertension: Secondary | ICD-10-CM | POA: Diagnosis not present

## 2024-03-20 DIAGNOSIS — I700 Atherosclerosis of aorta: Secondary | ICD-10-CM | POA: Diagnosis not present

## 2024-07-31 ENCOUNTER — Telehealth: Payer: Self-pay

## 2024-07-31 NOTE — Telephone Encounter (Signed)
Called the patient no answer 

## 2024-07-31 NOTE — Telephone Encounter (Signed)
 Copied from CRM #[id number]. Topic: Appointments - Scheduling Inquiry for Clinic >> Jul 31, 2024 11:39 AM Tracy Bonilla wrote: Reason for CRM: Pt made the appt for a new pt appt with [name] back in August of this year and now is being told to schedule with another provider who is pushed out until march 31st. Pt will run out of medication and is upset because there was no way of her knowing that she would not be able to establish care.   Pt is conserned and would like for someone to follow up. I called [facility] 3 times no answer.

## 2024-08-08 ENCOUNTER — Ambulatory Visit: Payer: Self-pay | Admitting: Family Medicine

## 2024-11-18 ENCOUNTER — Ambulatory Visit: Payer: Self-pay
